# Patient Record
Sex: Female | Born: 1955 | Race: White | Hispanic: No | Marital: Married | Smoking: Never smoker
Health system: Southern US, Community
[De-identification: ages and names within clinical notes are randomized; demographics above are authoritative.]

## PROBLEM LIST (undated history)

## (undated) DIAGNOSIS — C799 Secondary malignant neoplasm of unspecified site: Secondary | ICD-10-CM

## (undated) DIAGNOSIS — B029 Zoster without complications: Secondary | ICD-10-CM

## (undated) DIAGNOSIS — D249 Benign neoplasm of unspecified breast: Secondary | ICD-10-CM

## (undated) DIAGNOSIS — C3490 Malignant neoplasm of unspecified part of unspecified bronchus or lung: Secondary | ICD-10-CM

## (undated) DIAGNOSIS — R51 Headache: Secondary | ICD-10-CM

## (undated) DIAGNOSIS — C7951 Secondary malignant neoplasm of bone: Secondary | ICD-10-CM

## (undated) DIAGNOSIS — Z9221 Personal history of antineoplastic chemotherapy: Secondary | ICD-10-CM

## (undated) DIAGNOSIS — IMO0002 Reserved for concepts with insufficient information to code with codable children: Secondary | ICD-10-CM

## (undated) DIAGNOSIS — G939 Disorder of brain, unspecified: Secondary | ICD-10-CM

## (undated) DIAGNOSIS — C7931 Secondary malignant neoplasm of brain: Secondary | ICD-10-CM

## (undated) DIAGNOSIS — C50919 Malignant neoplasm of unspecified site of unspecified female breast: Secondary | ICD-10-CM

## (undated) DIAGNOSIS — Z923 Personal history of irradiation: Secondary | ICD-10-CM

## (undated) DIAGNOSIS — K589 Irritable bowel syndrome without diarrhea: Secondary | ICD-10-CM

## (undated) DIAGNOSIS — R918 Other nonspecific abnormal finding of lung field: Secondary | ICD-10-CM

## (undated) DIAGNOSIS — Z79890 Hormone replacement therapy (postmenopausal): Secondary | ICD-10-CM

## (undated) DIAGNOSIS — IMO0001 Reserved for inherently not codable concepts without codable children: Secondary | ICD-10-CM

## (undated) HISTORY — DX: Zoster without complications: B02.9

## (undated) HISTORY — DX: Other nonspecific abnormal finding of lung field: R91.8

## (undated) HISTORY — DX: Malignant neoplasm of unspecified part of unspecified bronchus or lung: C34.90

## (undated) HISTORY — DX: Secondary malignant neoplasm of bone: C79.51

## (undated) HISTORY — DX: Benign neoplasm of unspecified breast: D24.9

## (undated) HISTORY — PX: LUNG BIOPSY: SHX5088

## (undated) HISTORY — DX: Secondary malignant neoplasm of brain: C79.31

## (undated) HISTORY — PX: VERTEBROPLASTY: SHX113

## (undated) HISTORY — DX: Irritable bowel syndrome, unspecified: K58.9

## (undated) HISTORY — DX: Personal history of irradiation: Z92.3

## (undated) HISTORY — DX: Hormone replacement therapy: Z79.890

## (undated) HISTORY — DX: Malignant neoplasm of unspecified site of unspecified female breast: C50.919

---

## 1998-12-31 ENCOUNTER — Other Ambulatory Visit: Admission: RE | Admit: 1998-12-31 | Discharge: 1998-12-31 | Payer: Self-pay | Admitting: Obstetrics and Gynecology

## 1999-01-27 ENCOUNTER — Ambulatory Visit (HOSPITAL_COMMUNITY): Admission: RE | Admit: 1999-01-27 | Discharge: 1999-01-27 | Payer: Self-pay | Admitting: Obstetrics and Gynecology

## 1999-01-27 ENCOUNTER — Encounter: Payer: Self-pay | Admitting: Obstetrics and Gynecology

## 1999-08-03 ENCOUNTER — Encounter: Payer: Self-pay | Admitting: Obstetrics and Gynecology

## 1999-08-03 ENCOUNTER — Encounter: Admission: RE | Admit: 1999-08-03 | Discharge: 1999-08-03 | Payer: Self-pay | Admitting: Otolaryngology

## 2000-02-08 ENCOUNTER — Other Ambulatory Visit: Admission: RE | Admit: 2000-02-08 | Discharge: 2000-02-08 | Payer: Self-pay | Admitting: Obstetrics and Gynecology

## 2000-09-05 ENCOUNTER — Encounter: Payer: Self-pay | Admitting: Obstetrics and Gynecology

## 2000-09-05 ENCOUNTER — Ambulatory Visit (HOSPITAL_COMMUNITY): Admission: RE | Admit: 2000-09-05 | Discharge: 2000-09-05 | Payer: Self-pay | Admitting: Obstetrics and Gynecology

## 2000-09-07 ENCOUNTER — Encounter: Admission: RE | Admit: 2000-09-07 | Discharge: 2000-09-07 | Payer: Self-pay | Admitting: Obstetrics and Gynecology

## 2000-09-07 ENCOUNTER — Encounter: Payer: Self-pay | Admitting: Obstetrics and Gynecology

## 2001-02-20 ENCOUNTER — Other Ambulatory Visit: Admission: RE | Admit: 2001-02-20 | Discharge: 2001-02-20 | Payer: Self-pay | Admitting: Obstetrics and Gynecology

## 2002-04-29 ENCOUNTER — Other Ambulatory Visit: Admission: RE | Admit: 2002-04-29 | Discharge: 2002-04-29 | Payer: Self-pay | Admitting: Obstetrics and Gynecology

## 2002-07-30 ENCOUNTER — Encounter: Payer: Self-pay | Admitting: Obstetrics and Gynecology

## 2002-07-30 ENCOUNTER — Ambulatory Visit (HOSPITAL_COMMUNITY): Admission: RE | Admit: 2002-07-30 | Discharge: 2002-07-30 | Payer: Self-pay | Admitting: Obstetrics and Gynecology

## 2003-07-14 ENCOUNTER — Other Ambulatory Visit: Admission: RE | Admit: 2003-07-14 | Discharge: 2003-07-14 | Payer: Self-pay | Admitting: Obstetrics and Gynecology

## 2003-07-31 ENCOUNTER — Ambulatory Visit (HOSPITAL_COMMUNITY): Admission: RE | Admit: 2003-07-31 | Discharge: 2003-07-31 | Payer: Self-pay | Admitting: Obstetrics and Gynecology

## 2004-08-05 ENCOUNTER — Ambulatory Visit (HOSPITAL_COMMUNITY): Admission: RE | Admit: 2004-08-05 | Discharge: 2004-08-05 | Payer: Self-pay | Admitting: Obstetrics and Gynecology

## 2004-09-30 ENCOUNTER — Other Ambulatory Visit: Admission: RE | Admit: 2004-09-30 | Discharge: 2004-09-30 | Payer: Self-pay | Admitting: Obstetrics and Gynecology

## 2005-10-04 ENCOUNTER — Other Ambulatory Visit: Admission: RE | Admit: 2005-10-04 | Discharge: 2005-10-04 | Payer: Self-pay | Admitting: Obstetrics and Gynecology

## 2005-10-04 ENCOUNTER — Ambulatory Visit (HOSPITAL_COMMUNITY): Admission: RE | Admit: 2005-10-04 | Discharge: 2005-10-04 | Payer: Self-pay | Admitting: Obstetrics and Gynecology

## 2005-10-16 HISTORY — PX: INTRAUTERINE DEVICE INSERTION: SHX323

## 2006-11-16 ENCOUNTER — Ambulatory Visit (HOSPITAL_COMMUNITY): Admission: RE | Admit: 2006-11-16 | Discharge: 2006-11-16 | Payer: Self-pay | Admitting: Obstetrics and Gynecology

## 2006-11-16 ENCOUNTER — Other Ambulatory Visit: Admission: RE | Admit: 2006-11-16 | Discharge: 2006-11-16 | Payer: Self-pay | Admitting: Obstetrics & Gynecology

## 2007-11-18 ENCOUNTER — Other Ambulatory Visit: Admission: RE | Admit: 2007-11-18 | Discharge: 2007-11-18 | Payer: Self-pay | Admitting: Obstetrics and Gynecology

## 2007-12-17 ENCOUNTER — Ambulatory Visit (HOSPITAL_COMMUNITY): Admission: RE | Admit: 2007-12-17 | Discharge: 2007-12-17 | Payer: Self-pay | Admitting: Obstetrics and Gynecology

## 2010-11-30 ENCOUNTER — Other Ambulatory Visit: Payer: Self-pay | Admitting: Nurse Practitioner

## 2010-11-30 DIAGNOSIS — Z1231 Encounter for screening mammogram for malignant neoplasm of breast: Secondary | ICD-10-CM

## 2010-12-29 ENCOUNTER — Ambulatory Visit (HOSPITAL_COMMUNITY)
Admission: RE | Admit: 2010-12-29 | Discharge: 2010-12-29 | Disposition: A | Discharge status: Home / Self Care | Payer: BC Managed Care – PPO | Source: Ambulatory Visit | Attending: Nurse Practitioner | Admitting: Nurse Practitioner

## 2010-12-29 DIAGNOSIS — Z1231 Encounter for screening mammogram for malignant neoplasm of breast: Secondary | ICD-10-CM | POA: Insufficient documentation

## 2011-01-17 HISTORY — PX: BREAST BIOPSY: SHX20

## 2011-06-07 ENCOUNTER — Other Ambulatory Visit (HOSPITAL_COMMUNITY): Payer: Self-pay | Admitting: Family Medicine

## 2011-06-07 DIAGNOSIS — R937 Abnormal findings on diagnostic imaging of other parts of musculoskeletal system: Secondary | ICD-10-CM

## 2011-06-22 ENCOUNTER — Encounter (HOSPITAL_COMMUNITY)
Admission: RE | Admit: 2011-06-22 | Discharge: 2011-06-22 | Disposition: A | Discharge status: Home / Self Care | Payer: BC Managed Care – PPO | Source: Ambulatory Visit | Attending: Family Medicine | Admitting: Family Medicine

## 2011-06-22 DIAGNOSIS — Z85828 Personal history of other malignant neoplasm of skin: Secondary | ICD-10-CM | POA: Insufficient documentation

## 2011-06-22 DIAGNOSIS — C801 Malignant (primary) neoplasm, unspecified: Secondary | ICD-10-CM | POA: Insufficient documentation

## 2011-06-22 DIAGNOSIS — M25559 Pain in unspecified hip: Secondary | ICD-10-CM | POA: Insufficient documentation

## 2011-06-22 DIAGNOSIS — C7800 Secondary malignant neoplasm of unspecified lung: Secondary | ICD-10-CM | POA: Insufficient documentation

## 2011-06-22 DIAGNOSIS — R937 Abnormal findings on diagnostic imaging of other parts of musculoskeletal system: Secondary | ICD-10-CM

## 2011-06-22 DIAGNOSIS — M545 Low back pain, unspecified: Secondary | ICD-10-CM | POA: Insufficient documentation

## 2011-06-22 DIAGNOSIS — K7689 Other specified diseases of liver: Secondary | ICD-10-CM | POA: Insufficient documentation

## 2011-06-22 DIAGNOSIS — C770 Secondary and unspecified malignant neoplasm of lymph nodes of head, face and neck: Secondary | ICD-10-CM | POA: Insufficient documentation

## 2011-06-22 DIAGNOSIS — C771 Secondary and unspecified malignant neoplasm of intrathoracic lymph nodes: Secondary | ICD-10-CM | POA: Insufficient documentation

## 2011-06-22 DIAGNOSIS — M8448XA Pathological fracture, other site, initial encounter for fracture: Secondary | ICD-10-CM | POA: Insufficient documentation

## 2011-06-22 DIAGNOSIS — R918 Other nonspecific abnormal finding of lung field: Secondary | ICD-10-CM

## 2011-06-22 DIAGNOSIS — I517 Cardiomegaly: Secondary | ICD-10-CM | POA: Insufficient documentation

## 2011-06-22 DIAGNOSIS — C7951 Secondary malignant neoplasm of bone: Secondary | ICD-10-CM | POA: Insufficient documentation

## 2011-06-22 HISTORY — DX: Other nonspecific abnormal finding of lung field: R91.8

## 2011-06-22 MED ORDER — TECHNETIUM TC 99M MEDRONATE IV KIT
24.0000 | PACK | Freq: Once | INTRAVENOUS | Status: AC | PRN
  Administered 2011-06-22: 24 via INTRAVENOUS

## 2011-06-22 MED ORDER — IOHEXOL 300 MG/ML  SOLN
100.0000 mL | Freq: Once | INTRAMUSCULAR | Status: AC | PRN
  Administered 2011-06-22: 100 mL via INTRAVENOUS

## 2011-06-26 ENCOUNTER — Telehealth: Payer: Self-pay | Admitting: Hematology & Oncology

## 2011-06-26 NOTE — Telephone Encounter (Signed)
Pt aware of 6-12 appointment °

## 2011-06-28 ENCOUNTER — Ambulatory Visit (HOSPITAL_BASED_OUTPATIENT_CLINIC_OR_DEPARTMENT_OTHER): Payer: BC Managed Care – PPO | Admitting: Hematology & Oncology

## 2011-06-28 ENCOUNTER — Ambulatory Visit: Payer: BC Managed Care – PPO

## 2011-06-28 ENCOUNTER — Other Ambulatory Visit (HOSPITAL_BASED_OUTPATIENT_CLINIC_OR_DEPARTMENT_OTHER): Payer: BC Managed Care – PPO | Admitting: Lab

## 2011-06-28 VITALS — BP 149/83 | HR 101 | Temp 97.1°F | Ht 62.0 in | Wt 173.0 lb

## 2011-06-28 DIAGNOSIS — C787 Secondary malignant neoplasm of liver and intrahepatic bile duct: Secondary | ICD-10-CM

## 2011-06-28 DIAGNOSIS — C7951 Secondary malignant neoplasm of bone: Secondary | ICD-10-CM

## 2011-06-28 DIAGNOSIS — C7800 Secondary malignant neoplasm of unspecified lung: Secondary | ICD-10-CM

## 2011-06-28 DIAGNOSIS — C801 Malignant (primary) neoplasm, unspecified: Secondary | ICD-10-CM

## 2011-06-28 LAB — CBC WITH DIFFERENTIAL (CANCER CENTER ONLY)
EOS%: 3.6 % (ref 0.0–7.0)
MCH: 27.9 pg (ref 26.0–34.0)
MONO%: 8.9 % (ref 0.0–13.0)
Platelets: 531 10*3/uL — ABNORMAL HIGH (ref 145–400)
WBC: 10.1 10*3/uL — ABNORMAL HIGH (ref 3.9–10.0)

## 2011-06-28 LAB — PROTIME-INR (CHCC SATELLITE)
INR: 1.1 — ABNORMAL LOW (ref 2.0–3.5)
Protime: 13.2 Seconds (ref 10.6–13.4)

## 2011-06-28 MED ORDER — HYDROCODONE-IBUPROFEN 7.5-200 MG PO TABS: 1.0000 | ORAL_TABLET | Freq: Three times a day (TID) | ORAL | Status: DC | PRN

## 2011-06-28 MED ORDER — ALPRAZOLAM 0.25 MG PO TABS: ORAL_TABLET | ORAL | Status: DC

## 2011-06-28 NOTE — Progress Notes (Signed)
This office note has been dictated.

## 2011-06-29 ENCOUNTER — Other Ambulatory Visit: Payer: Self-pay | Admitting: Radiology

## 2011-06-29 NOTE — Progress Notes (Signed)
CC:   [name], M.D.  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  HISTORY OF PRESENT ILLNESS:  Ms. Hanko is a very nice 56 year old white female.  She is originally from [facility].  She knew exactly who Casimiro Needle was.  She is followed by Dr. [name].  She has been complaining of some back and hip pain.  This is in the right hip area.  She denied any kind of trauma.  She works for an [employer].  She has had no weight loss or weight gain.  Appetite has been okay. There has been no change in bowel or bladder habits.  She has had no rashes.  She has had no headache.  She was seen by Dr. [name].  Dr. Lenise Arena, astutely, went ahead and did some films on her.  The plain films showed some significant abnormalities.  As such, these were followed up with CT scans.  She had a CT scan of the chest, abdomen, pelvis on June 6.  Unfortunately, the CT scans showed multiple pulmonary nodules.  These were bilateral.  She did have a mass-like lesion in the left lower lobe measuring 6.4 x 3.6 cm.  She did have some left supraclavicular lymphadenopathy.  She had a left suprahilar lymph node.  She had a precarinal node.  There was some fullness in the left breast.  She had multiple bony lesions.  There was a left hepatic dome lesion measuring 1.4 cm.  She was noted to have a right superior pubic ramus  pathologic fracture.  A lytic lesion was noted in the left femoral head.  She had a bone scan done, which showed multiple areas of bony metastases.  Upon finding this, Dr. Lenise Arena kindly referred Ms. Smolen to the [facility] for evaluation.  Ms. Schlotter  gets her mammograms yearly.  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  She has not noted any fever.  There has been no dysphasia or odynophagia.  She has had no double vision or blurred vision.  She has had no bleeding.  She has been taking  Motrin and Vicodin to try to help with some of the back and hip discomfort.  PAST MEDICAL HISTORY:  Remarkable for an irritable bowel syndrome.  ALLERGIES:  None.  MEDICATIONS:  Vivelle patch 0.0375 mg twice a week, Provera 5 mg p.o. daily, Vicodin (5/500) 1 p.o. q.6 hours p.r.n.,  Flexeril 10 mg p.o. daily p.r.n.,  Motrin 200 mg p.o. q.6 hours p.r.n.  SOCIAL HISTORY:  Negative for tobacco use.  There is rare alcohol use. There is no obvious occupational exposures.  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  FAMILY HISTORY:  Pretty much unremarkable.  There is no history of malignancy in the family.  REVIEW OF SYSTEMS:  As stated in history of PI the history of present illness.  No additional findings  are noted on a 12-system review.  PHYSICAL EXAMINATION:  This is a well-developed, well-nourished white female in no obvious distress.  Vital signs:  Temperature of 97.1, pulse 101, respiratory rate 20, blood pressure 149/83.  Weight is 173.  Head and neck:  Normocephalic, atraumatic skull.  There are no ocular or oral lesions.  There is no adenopathy in the neck.  I cannot palpate any supraclavicular lymph nodes.  Lungs:  Clear bilaterally.  There are no rales, wheeze or rhonchi.  Cardiac:  Tachycardic but regular.  There are no murmurs, rubs or  bruits.  Axillary:  No bilateral axillary adenopathy.  Abdomen:  Soft with good bowel sounds.  There is no palpable abdominal mass.  There is no fluid wave.  There is no palpable hepatosplenomegaly. Back:  No tenderness over the spine, ribs, or hips. Extremities:  No clubbing, cyanosis or edema.  She has good range motion of her joints.  There is some decreased range of motion of the right hip.  Skin: No rashes, ecchymoses or petechia.  Neurologic:  No focal neurological deficits.  LABORATORY STUDIES:  White cell count is 10.1, hemoglobin 11.8, hematocrit 35.9 and platelet count 531.  INR is 1.1.  IMPRESSION:  Ms. Mandala is a very  nice 56 year old white female.  She has a widely metastatic disease.  We do not have a biopsy yet.  The real key is trying to decide what best to biopsy.  She does have this large mass in the left lung.  I think this might be a reasonable spot to biopsy.  This also noted a left supraclavicular lymph node.  I really could not palpate a supraclavicular lymph node.  She has multiple bone abnormalities.  However, I would prefer not to have to go after a bony lesion.  She will definitely need radiation therapy to the right hip area.  I will speak with Radiation Oncology in the morning and have them look at the x-rays.  I am going to hold off on doing further studies on her until we get the path results back.  I want be able to try to focus in on the appropriate staging studies to do.  I did forget to mention that she does have a history of squamous cell carcinomas removed and a basal carcinoma.  I would be hard pressed to believe that these abnormality seen on scan represent metastatic disease for either of these tumors.  There is a history of melanoma in, I think, an uncle.  I suppose this might increase the risk of her having melanoma.  Again, she has not had any suspicious moles removed.  I would not think that this is a lymphoproliferative process.  I also would not think that this would be a sarcoma or other connective tissue type of malignancy.  There was noted on CT scan to be in an abnormality in the left breast. Again, metastatic breast cancer, I suppose, could be a possibility, although she has had her mammograms on a routine basis.  Her last mammogram was done back in December.  Everything looked fine with a 1 year followup being recommended.  I spent a good hour and a half with Ms. Baze and her husband. They are both very, very nice.  It was certainly fun talking to them about Casimiro Needle and [place] football.  Once we get the biopsy results back, then we will be  able to get Ms. Gaumond back in and figure out how we can best treat this problem.    ______________________________ Josph Macho, M.D. PRE/MEDQ  D:  06/28/2011  T:  06/29/2011  Job:  [id number]

## 2011-06-29 NOTE — Addendum Note (Signed)
Addended by: Arlan Organ R on: 06/29/2011 12:31 PM   Modules accepted: Orders

## 2011-06-30 ENCOUNTER — Encounter: Payer: Self-pay | Admitting: *Deleted

## 2011-06-30 ENCOUNTER — Encounter (HOSPITAL_COMMUNITY): Payer: Self-pay | Admitting: Pharmacy Technician

## 2011-06-30 DIAGNOSIS — D249 Benign neoplasm of unspecified breast: Secondary | ICD-10-CM | POA: Insufficient documentation

## 2011-06-30 LAB — COMPREHENSIVE METABOLIC PANEL
ALT: 51 U/L — ABNORMAL HIGH (ref 0–35)
AST: 41 U/L — ABNORMAL HIGH (ref 0–37)
Alkaline Phosphatase: 422 U/L — ABNORMAL HIGH (ref 39–117)
BUN: 8 mg/dL (ref 6–23)
CO2: 26 mEq/L (ref 19–32)
Calcium: 9.7 mg/dL (ref 8.4–10.5)
Chloride: 101 mEq/L (ref 96–112)
Potassium: 3.8 mEq/L (ref 3.5–5.3)
Sodium: 137 mEq/L (ref 135–145)

## 2011-06-30 LAB — LACTATE DEHYDROGENASE: LDH: 191 U/L (ref 94–250)

## 2011-06-30 LAB — PREALBUMIN: Prealbumin: 12.4 mg/dL — ABNORMAL LOW (ref 17.0–34.0)

## 2011-06-30 NOTE — Progress Notes (Signed)
Married, 2 sons, works for [employer]

## 2011-07-03 ENCOUNTER — Encounter (HOSPITAL_BASED_OUTPATIENT_CLINIC_OR_DEPARTMENT_OTHER): Payer: Self-pay | Admitting: *Deleted

## 2011-07-03 ENCOUNTER — Ambulatory Visit (HOSPITAL_COMMUNITY)
Admission: RE | Admit: 2011-07-03 | Discharge: 2011-07-03 | Disposition: A | Discharge status: Home / Self Care | Payer: BC Managed Care – PPO | Source: Ambulatory Visit | Attending: Interventional Radiology | Admitting: Interventional Radiology

## 2011-07-03 ENCOUNTER — Ambulatory Visit (HOSPITAL_COMMUNITY)
Admission: RE | Admit: 2011-07-03 | Discharge: 2011-07-03 | Disposition: A | Discharge status: Home / Self Care | Payer: BC Managed Care – PPO | Source: Ambulatory Visit | Attending: Hematology & Oncology | Admitting: Hematology & Oncology

## 2011-07-03 ENCOUNTER — Emergency Department (HOSPITAL_BASED_OUTPATIENT_CLINIC_OR_DEPARTMENT_OTHER)
Admission: EM | Admit: 2011-07-03 | Discharge: 2011-07-03 | Disposition: A | Discharge status: Home / Self Care | Payer: BC Managed Care – PPO | Source: Home / Self Care | Attending: Emergency Medicine | Admitting: Emergency Medicine

## 2011-07-03 ENCOUNTER — Encounter (HOSPITAL_COMMUNITY): Payer: Self-pay

## 2011-07-03 ENCOUNTER — Other Ambulatory Visit: Payer: Self-pay | Admitting: Hematology & Oncology

## 2011-07-03 ENCOUNTER — Inpatient Hospital Stay (HOSPITAL_BASED_OUTPATIENT_CLINIC_OR_DEPARTMENT_OTHER)
Admission: EM | Admit: 2011-07-03 | Discharge: 2011-07-07 | DRG: 866 | Disposition: A | Discharge status: Home / Self Care | Payer: BC Managed Care – PPO | Attending: Internal Medicine | Admitting: Internal Medicine

## 2011-07-03 ENCOUNTER — Emergency Department (HOSPITAL_BASED_OUTPATIENT_CLINIC_OR_DEPARTMENT_OTHER): Payer: BC Managed Care – PPO

## 2011-07-03 VITALS — BP 118/72 | HR 93 | Temp 97.4°F | Resp 18 | Ht 62.0 in | Wt 173.0 lb

## 2011-07-03 DIAGNOSIS — M62830 Muscle spasm of back: Secondary | ICD-10-CM

## 2011-07-03 DIAGNOSIS — C801 Malignant (primary) neoplasm, unspecified: Secondary | ICD-10-CM

## 2011-07-03 DIAGNOSIS — R7989 Other specified abnormal findings of blood chemistry: Secondary | ICD-10-CM | POA: Diagnosis present

## 2011-07-03 DIAGNOSIS — K589 Irritable bowel syndrome without diarrhea: Secondary | ICD-10-CM | POA: Insufficient documentation

## 2011-07-03 DIAGNOSIS — G893 Neoplasm related pain (acute) (chronic): Secondary | ICD-10-CM | POA: Diagnosis present

## 2011-07-03 DIAGNOSIS — C7800 Secondary malignant neoplasm of unspecified lung: Secondary | ICD-10-CM | POA: Diagnosis present

## 2011-07-03 DIAGNOSIS — R0902 Hypoxemia: Secondary | ICD-10-CM | POA: Diagnosis present

## 2011-07-03 DIAGNOSIS — D638 Anemia in other chronic diseases classified elsewhere: Secondary | ICD-10-CM | POA: Diagnosis present

## 2011-07-03 DIAGNOSIS — IMO0002 Reserved for concepts with insufficient information to code with codable children: Secondary | ICD-10-CM

## 2011-07-03 DIAGNOSIS — C3490 Malignant neoplasm of unspecified part of unspecified bronchus or lung: Secondary | ICD-10-CM

## 2011-07-03 DIAGNOSIS — C3430 Malignant neoplasm of lower lobe, unspecified bronchus or lung: Secondary | ICD-10-CM | POA: Insufficient documentation

## 2011-07-03 DIAGNOSIS — J90 Pleural effusion, not elsewhere classified: Secondary | ICD-10-CM | POA: Diagnosis present

## 2011-07-03 DIAGNOSIS — M25559 Pain in unspecified hip: Secondary | ICD-10-CM | POA: Insufficient documentation

## 2011-07-03 DIAGNOSIS — C7951 Secondary malignant neoplasm of bone: Secondary | ICD-10-CM | POA: Insufficient documentation

## 2011-07-03 DIAGNOSIS — R079 Chest pain, unspecified: Secondary | ICD-10-CM

## 2011-07-03 DIAGNOSIS — R000 Tachycardia, unspecified: Secondary | ICD-10-CM | POA: Diagnosis present

## 2011-07-03 DIAGNOSIS — Z79899 Other long term (current) drug therapy: Secondary | ICD-10-CM

## 2011-07-03 DIAGNOSIS — E860 Dehydration: Secondary | ICD-10-CM | POA: Diagnosis present

## 2011-07-03 DIAGNOSIS — M8448XA Pathological fracture, other site, initial encounter for fracture: Secondary | ICD-10-CM | POA: Diagnosis present

## 2011-07-03 DIAGNOSIS — M549 Dorsalgia, unspecified: Secondary | ICD-10-CM | POA: Insufficient documentation

## 2011-07-03 DIAGNOSIS — C799 Secondary malignant neoplasm of unspecified site: Secondary | ICD-10-CM

## 2011-07-03 DIAGNOSIS — K5900 Constipation, unspecified: Secondary | ICD-10-CM | POA: Diagnosis present

## 2011-07-03 HISTORY — DX: Secondary malignant neoplasm of unspecified site: C79.9

## 2011-07-03 LAB — CBC
HCT: 35.9 % — ABNORMAL LOW (ref 36.0–46.0)
Hemoglobin: 11.8 g/dL — ABNORMAL LOW (ref 12.0–15.0)
MCH: 27.6 pg (ref 26.0–34.0)
MCHC: 32.9 g/dL (ref 30.0–36.0)
MCV: 83.9 fL (ref 78.0–100.0)
Platelets: 496 K/uL — ABNORMAL HIGH (ref 150–400)
RBC: 4.28 MIL/uL (ref 3.87–5.11)
RDW: 14.5 % (ref 11.5–15.5)
WBC: 10.4 K/uL (ref 4.0–10.5)

## 2011-07-03 LAB — PROTIME-INR
INR: 1.02 (ref 0.00–1.49)
Prothrombin Time: 13.6 s (ref 11.6–15.2)

## 2011-07-03 MED ORDER — ONDANSETRON HCL 4 MG/2ML IJ SOLN
4.0000 mg | Freq: Once | INTRAMUSCULAR | Status: AC
  Administered 2011-07-03: 4 mg via INTRAVENOUS
  Filled 2011-07-03: qty 2

## 2011-07-03 MED ORDER — HYDROMORPHONE HCL PF 1 MG/ML IJ SOLN
1.0000 mg | Freq: Once | INTRAMUSCULAR | Status: AC
  Administered 2011-07-03: 1 mg via INTRAVENOUS
  Filled 2011-07-03: qty 1

## 2011-07-03 MED ORDER — PROMETHAZINE HCL 12.5 MG PO TABS: ORAL_TABLET | ORAL | Status: DC

## 2011-07-03 MED ORDER — MIDAZOLAM HCL 2 MG/2ML IJ SOLN
INTRAMUSCULAR | Status: AC
  Filled 2011-07-03: qty 4

## 2011-07-03 MED ORDER — SODIUM CHLORIDE 0.9 % IV SOLN: Freq: Once | INTRAVENOUS | Status: DC

## 2011-07-03 MED ORDER — CYCLOBENZAPRINE HCL 10 MG PO TABS: 10.0000 mg | ORAL_TABLET | Freq: Three times a day (TID) | ORAL | Status: AC | PRN

## 2011-07-03 MED ORDER — MIDAZOLAM HCL 5 MG/5ML IJ SOLN
INTRAMUSCULAR | Status: AC | PRN
  Administered 2011-07-03 (×2): 1 mg via INTRAVENOUS

## 2011-07-03 MED ORDER — HYDROMORPHONE HCL 2 MG PO TABS: 2.0000 mg | ORAL_TABLET | ORAL | Status: AC | PRN

## 2011-07-03 MED ORDER — FENTANYL CITRATE 0.05 MG/ML IJ SOLN
INTRAMUSCULAR | Status: AC | PRN
  Administered 2011-07-03: 50 ug via INTRAVENOUS
  Administered 2011-07-03: 25 ug via INTRAVENOUS

## 2011-07-03 MED ORDER — FENTANYL CITRATE 0.05 MG/ML IJ SOLN
INTRAMUSCULAR | Status: AC
  Filled 2011-07-03: qty 4

## 2011-07-03 MED FILL — Midazolam HCl Inj 2 MG/2ML (Base Equivalent): INTRAMUSCULAR | Qty: 1 | Status: AC

## 2011-07-03 MED FILL — Fentanyl Citrate Inj 0.05 MG/ML: INTRAMUSCULAR | Qty: 0.5 | Status: AC

## 2011-07-03 NOTE — Discharge Instructions (Signed)
Lung Biopsy A Lung Biopsy involves taking a small piece of lung tissue so it can be examined under a microscope by a pathologist to help diagnose many different lung disorders. This test can be done in a variety of ways. One way is to open the chest during surgery and remove a piece of lung tissue. It can also be done through a bronchoscope (a pencil-sized telescope) which allows the caregiver to remove a piece of tissue through your trachea (the large breathing tube to your lungs). Another procedure is a needle biopsy of the lung, which involves inserting a needle into the lung and removing a small piece of tissue. POSSIBLE COMPLICATIONS INCLUDE:  Collapse of the lung   Bleeding   Infection  PREPARATION FOR TEST No preparation or fasting is necessary. NORMAL FINDINGS No evidence of pathology. Ranges for normal findings may vary among different laboratories and hospitals. You should always check with your doctor after having lab work or other tests done to discuss the meaning of your test results and whether your values are considered within normal limits. MEANING OF TEST   Your caregiver will go over the test results with you and discuss the importance and meaning of your results, as well as treatment options and the need for additional tests if necessary. OBTAINING THE TEST RESULTS It is your responsibility to obtain your test results. Ask the lab or department performing the test when and how you will get your results. Document Released: 03/23/2004 Document Revised: 12/22/2010 Document Reviewed: 12/14/2007 Exit[employer] Patient Information 2012 [employer]. 

## 2011-07-03 NOTE — ED Notes (Signed)
Pt diagnosed with cancer, had lung biopsy today, pain has been increasing in intensity x 3 days, today pain became unbearable, dr. Myna Hidalgo is oncologist,

## 2011-07-03 NOTE — ED Notes (Signed)
To XRAY

## 2011-07-03 NOTE — ED Notes (Signed)
Pt states she just found out she has cancer "all over" on Friday a week ago. Scheduled for biopsy tomorrow. Now c/o pain along rib cage. Taking Hydrocodone and Ibuprofen without relief.

## 2011-07-03 NOTE — Procedures (Signed)
LLL lung mass Bx 18 g core times 4 Small PTX

## 2011-07-03 NOTE — ED Notes (Signed)
Pt seen here last night for same. Dilaudid tabs prescribed last PM is not working.

## 2011-07-03 NOTE — H&P (Signed)
[name] is an 56 y.o. female.   Chief Complaint: back and hip pain since 1/13 Xray and CT show bony, liver and lung lesions Scheduled now for left lung mass biopsy HPI: IBS; hx skin ca  Past Medical History  Diagnosis Date  . Pulmonary nodules 06/22/11    per CT scan  . IBS (irritable bowel syndrome)   . Fibroadenoma of breast     hx of  . Metastasis of unknown origin     History reviewed. No pertinent past surgical history.  History reviewed. No pertinent family history. Social History:  reports that she has never smoked. She has never used smokeless tobacco. She reports that she drinks alcohol. She reports that she does not use illicit drugs.  Allergies: No Known Allergies   (Not in a hospital admission)  Results for orders placed during the hospital encounter of 07/03/11 (from the past 48 hour(s))  APTT     Status: Normal   Collection Time   07/03/11  7:46 AM      Component Value Range Comment   aPTT 31  24 - 37 seconds   CBC     Status: Abnormal   Collection Time   07/03/11  7:46 AM      Component Value Range Comment   WBC 10.4  4.0 - 10.5 K/uL    RBC 4.28  3.87 - 5.11 MIL/uL    Hemoglobin 11.8 (*) 12.0 - 15.0 g/dL    HCT 47.8 (*) 29.5 - 46.0 %    MCV 83.9  78.0 - 100.0 fL    MCH 27.6  26.0 - 34.0 pg    MCHC 32.9  30.0 - 36.0 g/dL    RDW 62.1  30.8 - 65.7 %    Platelets 496 (*) 150 - 400 K/uL   PROTIME-INR     Status: Normal   Collection Time   07/03/11  7:46 AM      Component Value Range Comment   Prothrombin Time 13.6  11.6 - 15.2 seconds    INR 1.02  0.00 - 1.49    No results found.  Review of Systems  Constitutional: Negative for fever.  HENT: Positive for neck pain.   Cardiovascular: Positive for chest pain.  Gastrointestinal: Negative for nausea and vomiting.  Musculoskeletal: Positive for back pain and joint pain.  Neurological: Negative for headaches.    Blood pressure 147/89, pulse 83, temperature 97.2 F (36.2 C), temperature source  Oral, resp. rate 18, height 5\' 2"  (1.575 m), weight 173 lb (78.472 kg), SpO2 99.00%. Physical Exam  Constitutional: She is oriented to person, place, and time. She appears well-developed and well-nourished.  Cardiovascular: Normal rate, regular rhythm and normal heart sounds.   No murmur heard. Respiratory: Effort normal and breath sounds normal. She has no wheezes.  GI: Soft. Bowel sounds are normal. There is no tenderness.  Musculoskeletal: Normal range of motion.       Pain in back and hips and ant chest; difficult to ambulate and move  Neurological: She is alert and oriented to person, place, and time. No cranial nerve deficit.  Psychiatric: She has a normal mood and affect. Her behavior is normal. Judgment and thought content normal.     Assessment/Plan Continued back and hip pain for months Xray and CT show liver, lung and bony lesions Scheduled for left lung mass biopsy today Pt and husband aware of procedure benefits and risks and agreeable to proceed. Consent signed.  Meaghann Choo A 07/03/2011, 8:46 AM

## 2011-07-03 NOTE — ED Notes (Signed)
Patients o2 dropped to 77% on RA. Patient placed on 2L [place] and O2 increased to 99%.

## 2011-07-03 NOTE — ED Provider Notes (Signed)
History     CSN: [id number]  Arrival date & time 07/03/11  0046   First MD Initiated Contact with Patient 07/03/11 0106      Chief Complaint  Patient presents with  . Back Pain    (Consider location/radiation/quality/duration/timing/severity/associated sxs/prior treatment) HPI This is a 56 year old white female who was recently diagnosed with widespread metastatic cancer of unknown source. She has multiple bony metastases. Her worst pain is in the center of her back radiating bilaterally around to her chest. This pain is severe. She was prescribed hydrocodone for it which was partially relieved to get for the past 2 days but yesterday evening. Helping. The pain is sharp and exacerbated by movement and certain positions. She does have a known rib lesion. She denies nausea. She has a biopsy planned for later today.  Past Medical History  Diagnosis Date  . Pulmonary nodules 06/22/11    per CT scan  . IBS (irritable bowel syndrome)   . Fibroadenoma of breast     hx of    History reviewed. No pertinent past surgical history.  History reviewed. No pertinent family history.  History  Substance Use Topics  . Smoking status: Never Smoker   . Smokeless tobacco: Not on file  . Alcohol Use: Yes     rarely    OB History    Grav Para Term Preterm Abortions TAB SAB Ect Mult Living                  Review of Systems  All other systems reviewed and are negative.    Allergies  Review of patient's allergies indicates no known allergies.  Home Medications   Current Outpatient Rx  Name Route Sig Dispense Refill  . ALPRAZOLAM 0.25 MG PO TABS Oral Take 0.25-0.5 mg by mouth every 8 (eight) hours as needed. For anxiety.    Marland Kitchen BISMUTH SUBSALICYLATE 262 MG PO CHEW Oral Chew 524 mg by mouth daily as needed. For indigestion.    Marland Kitchen VITAMIN D 1000 UNITS PO TABS Oral Take 1,000 Units by mouth daily.    Marland Kitchen DIPHENHYDRAMINE HCL 25 MG PO CAPS Oral Take 25 mg by mouth at bedtime as needed. For  sleep.    Marland Kitchen ESTRADIOL 0.0375 MG/24HR TD PTTW Transdermal Place 1 patch onto the skin 2 (two) times a week. Apply on Tuesdays and on Saturdays.    Marland Kitchen HYDROCODONE-IBUPROFEN 7.5-200 MG PO TABS Oral Take 1 tablet by mouth every 8 (eight) hours as needed. For pain.    . IBUPROFEN 200 MG PO TABS Oral Take 200 mg by mouth every 6 (six) hours as needed. For headache.    Marland Kitchen MEDROXYPROGESTERONE ACETATE 5 MG PO TABS Oral Take 2.5 mg by mouth daily.    . MULTIVITAMIN PO Oral Take 1 tablet by mouth every morning.       BP 133/105  Pulse 92  Temp 98.2 F (36.8 C) (Oral)  Resp 24  Ht 5\' 2"  (1.575 m)  Wt 173 lb (78.472 kg)  BMI 31.64 kg/m2  SpO2 99%  Physical Exam General: Well-developed, well-nourished female in apparent discomfort; appearance consistent with age of record HENT: normocephalic, atraumatic Eyes: pupils equal round and reactive to light; extraocular muscles intact Neck: supple Heart: regular rate and rhythm Lungs: clear to auscultation bilaterally Back: Mild tenderness of lower thoracic spine; severe pain on movement of the lower thoracic spine Abdomen: soft; nondistended; nontender; no masses or hepatosplenomegaly; bowel sounds present Extremities: No deformity; full range of motion; pulses normal  Neurologic: Awake, alert and oriented; motor function intact in all extremities and symmetric; no facial droop Skin: Warm and dry Psychiatric: Anxious    ED Course  Procedures (including critical care time)     MDM  3:26 AM Controlled with IV Dilaudid. Patient has a biopsy scheduled for 7:30 morning. We will write for Dilaudid by mouth in lieu of her Vicoprofen.        Hanley Seamen, MD 07/03/11 [phone]

## 2011-07-04 ENCOUNTER — Inpatient Hospital Stay (HOSPITAL_COMMUNITY): Payer: BC Managed Care – PPO

## 2011-07-04 ENCOUNTER — Emergency Department (HOSPITAL_BASED_OUTPATIENT_CLINIC_OR_DEPARTMENT_OTHER): Payer: BC Managed Care – PPO

## 2011-07-04 ENCOUNTER — Ambulatory Visit
Admission: RE | Admit: 2011-07-04 | Discharge: 2011-07-04 | Disposition: A | Discharge status: Home / Self Care | Payer: BC Managed Care – PPO | Source: Ambulatory Visit | Attending: Radiation Oncology | Admitting: Radiation Oncology

## 2011-07-04 ENCOUNTER — Telehealth: Payer: Self-pay | Admitting: *Deleted

## 2011-07-04 ENCOUNTER — Encounter (HOSPITAL_COMMUNITY): Payer: Self-pay | Admitting: Internal Medicine

## 2011-07-04 ENCOUNTER — Ambulatory Visit
Admit: 2011-07-04 | Discharge: 2011-07-04 | Disposition: A | Discharge status: Home / Self Care | Payer: BC Managed Care – PPO | Attending: Radiation Oncology | Admitting: Radiation Oncology

## 2011-07-04 VITALS — BP 127/63 | HR 88 | Resp 20

## 2011-07-04 DIAGNOSIS — J90 Pleural effusion, not elsewhere classified: Secondary | ICD-10-CM | POA: Diagnosis present

## 2011-07-04 DIAGNOSIS — G893 Neoplasm related pain (acute) (chronic): Secondary | ICD-10-CM | POA: Diagnosis present

## 2011-07-04 DIAGNOSIS — M546 Pain in thoracic spine: Secondary | ICD-10-CM

## 2011-07-04 DIAGNOSIS — C7951 Secondary malignant neoplasm of bone: Secondary | ICD-10-CM

## 2011-07-04 DIAGNOSIS — C3490 Malignant neoplasm of unspecified part of unspecified bronchus or lung: Secondary | ICD-10-CM

## 2011-07-04 DIAGNOSIS — C7952 Secondary malignant neoplasm of bone marrow: Secondary | ICD-10-CM

## 2011-07-04 DIAGNOSIS — Z510 Encounter for antineoplastic radiation therapy: Secondary | ICD-10-CM | POA: Insufficient documentation

## 2011-07-04 DIAGNOSIS — M545 Low back pain: Secondary | ICD-10-CM

## 2011-07-04 DIAGNOSIS — C801 Malignant (primary) neoplasm, unspecified: Secondary | ICD-10-CM

## 2011-07-04 LAB — BASIC METABOLIC PANEL
BUN: 9 mg/dL (ref 6–23)
CO2: 26 mEq/L (ref 19–32)
Chloride: 97 mEq/L (ref 96–112)
Creatinine, Ser: 0.5 mg/dL (ref 0.50–1.10)
Potassium: 4 mEq/L (ref 3.5–5.1)

## 2011-07-04 LAB — CBC
HCT: 34.6 % — ABNORMAL LOW (ref 36.0–46.0)
MCH: 27.6 pg (ref 26.0–34.0)
MCV: 83.6 fL (ref 78.0–100.0)
Platelets: 458 10*3/uL — ABNORMAL HIGH (ref 150–400)
RBC: 3.95 MIL/uL (ref 3.87–5.11)
RBC: 4.14 MIL/uL (ref 3.87–5.11)
WBC: 12.2 10*3/uL — ABNORMAL HIGH (ref 4.0–10.5)
WBC: 8.9 10*3/uL (ref 4.0–10.5)

## 2011-07-04 LAB — CREATININE, SERUM: Creatinine, Ser: 0.47 mg/dL — ABNORMAL LOW (ref 0.50–1.10)

## 2011-07-04 MED ORDER — ACETAMINOPHEN 650 MG RE SUPP: 650.0000 mg | Freq: Four times a day (QID) | RECTAL | Status: DC | PRN

## 2011-07-04 MED ORDER — HYDROMORPHONE HCL PF 2 MG/ML IJ SOLN
INTRAMUSCULAR | Status: AC
  Administered 2011-07-04: 1 mg
  Filled 2011-07-04: qty 1

## 2011-07-04 MED ORDER — HYDROMORPHONE HCL PF 1 MG/ML IJ SOLN
1.0000 mg | INTRAMUSCULAR | Status: DC | PRN
  Administered 2011-07-04 (×3): 1 mg via INTRAVENOUS
  Filled 2011-07-04 (×3): qty 1

## 2011-07-04 MED ORDER — DEXAMETHASONE SODIUM PHOSPHATE 4 MG/ML IJ SOLN: 40.0000 mg | INTRAMUSCULAR | Status: DC

## 2011-07-04 MED ORDER — CYCLOBENZAPRINE HCL 10 MG PO TABS
10.0000 mg | ORAL_TABLET | Freq: Three times a day (TID) | ORAL | Status: DC | PRN
  Filled 2011-07-04: qty 1

## 2011-07-04 MED ORDER — FENTANYL 25 MCG/HR TD PT72
25.0000 ug | MEDICATED_PATCH | TRANSDERMAL | Status: DC
  Administered 2011-07-04 – 2011-07-07 (×2): 25 ug via TRANSDERMAL
  Filled 2011-07-04 (×2): qty 1

## 2011-07-04 MED ORDER — SODIUM CHLORIDE 0.9 % IV SOLN
90.0000 mg | Freq: Once | INTRAVENOUS | Status: AC
  Administered 2011-07-04: 90 mg via INTRAVENOUS
  Filled 2011-07-04: qty 500

## 2011-07-04 MED ORDER — ONDANSETRON HCL 4 MG/2ML IJ SOLN
4.0000 mg | Freq: Four times a day (QID) | INTRAMUSCULAR | Status: DC | PRN
  Administered 2011-07-04: 4 mg via INTRAVENOUS
  Filled 2011-07-04: qty 2

## 2011-07-04 MED ORDER — HYDROMORPHONE HCL PF 1 MG/ML IJ SOLN
1.0000 mg | Freq: Once | INTRAMUSCULAR | Status: AC
Start: 2011-07-04 — End: 2011-07-04

## 2011-07-04 MED ORDER — FLEET ENEMA 7-19 GM/118ML RE ENEM: 1.0000 | ENEMA | Freq: Once | RECTAL | Status: AC | PRN

## 2011-07-04 MED ORDER — TRAZODONE 25 MG HALF TABLET
25.0000 mg | ORAL_TABLET | Freq: Every evening | ORAL | Status: DC | PRN
  Filled 2011-07-04: qty 1

## 2011-07-04 MED ORDER — SODIUM CHLORIDE 0.9 % IV SOLN
40.0000 mg | INTRAVENOUS | Status: DC
  Administered 2011-07-04 – 2011-07-06 (×3): 40 mg via INTRAVENOUS
  Filled 2011-07-04 (×4): qty 4

## 2011-07-04 MED ORDER — ONDANSETRON HCL 4 MG PO TABS: 4.0000 mg | ORAL_TABLET | Freq: Four times a day (QID) | ORAL | Status: DC | PRN

## 2011-07-04 MED ORDER — FENTANYL CITRATE 0.05 MG/ML IJ SOLN
INTRAMUSCULAR | Status: AC
  Administered 2011-07-04: 100 ug via INTRAVENOUS
  Filled 2011-07-04: qty 2

## 2011-07-04 MED ORDER — ENOXAPARIN SODIUM 40 MG/0.4ML ~~LOC~~ SOLN
40.0000 mg | SUBCUTANEOUS | Status: DC
  Administered 2011-07-04 – 2011-07-05 (×2): 40 mg via SUBCUTANEOUS
  Filled 2011-07-04 (×3): qty 0.4

## 2011-07-04 MED ORDER — BISACODYL 5 MG PO TBEC: 5.0000 mg | DELAYED_RELEASE_TABLET | Freq: Every day | ORAL | Status: DC | PRN

## 2011-07-04 MED ORDER — MEDROXYPROGESTERONE ACETATE 2.5 MG PO TABS
2.5000 mg | ORAL_TABLET | Freq: Every day | ORAL | Status: DC
  Administered 2011-07-04 – 2011-07-07 (×3): 2.5 mg via ORAL
  Filled 2011-07-04 (×4): qty 1

## 2011-07-04 MED ORDER — FENTANYL CITRATE 0.05 MG/ML IJ SOLN
100.0000 ug | Freq: Once | INTRAMUSCULAR | Status: AC
  Administered 2011-07-04: 100 ug via INTRAVENOUS

## 2011-07-04 MED ORDER — KETOROLAC TROMETHAMINE 30 MG/ML IJ SOLN
INTRAMUSCULAR | Status: AC
  Administered 2011-07-04: 30 mg
  Filled 2011-07-04: qty 1

## 2011-07-04 MED ORDER — DOCUSATE SODIUM 100 MG PO CAPS
100.0000 mg | ORAL_CAPSULE | Freq: Two times a day (BID) | ORAL | Status: DC
Start: 2011-07-04 — End: 2011-07-07
  Administered 2011-07-04 – 2011-07-07 (×6): 100 mg via ORAL
  Filled 2011-07-04 (×8): qty 1

## 2011-07-04 MED ORDER — HYDROMORPHONE HCL PF 1 MG/ML IJ SOLN
1.0000 mg | Freq: Once | INTRAMUSCULAR | Status: AC
  Administered 2011-07-04: 1 mg via INTRAMUSCULAR
  Filled 2011-07-04: qty 1

## 2011-07-04 MED ORDER — IOHEXOL 350 MG/ML SOLN
100.0000 mL | Freq: Once | INTRAVENOUS | Status: AC | PRN
  Administered 2011-07-04: 100 mL via INTRAVENOUS

## 2011-07-04 MED ORDER — ACETAMINOPHEN 325 MG PO TABS: 650.0000 mg | ORAL_TABLET | ORAL | Status: DC | PRN

## 2011-07-04 MED ORDER — POTASSIUM CHLORIDE IN NACL 20-0.9 MEQ/L-% IV SOLN
INTRAVENOUS | Status: DC
  Administered 2011-07-04: 50 mL/h via INTRAVENOUS
  Administered 2011-07-05 – 2011-07-07 (×3): via INTRAVENOUS
  Filled 2011-07-04 (×6): qty 1000

## 2011-07-04 MED ORDER — OXYCODONE HCL 5 MG PO TABS
5.0000 mg | ORAL_TABLET | ORAL | Status: DC | PRN
  Administered 2011-07-04: 5 mg via ORAL
  Filled 2011-07-04: qty 1

## 2011-07-04 MED ORDER — ALPRAZOLAM 0.25 MG PO TABS
0.2500 mg | ORAL_TABLET | Freq: Three times a day (TID) | ORAL | Status: DC | PRN
  Administered 2011-07-07: 0.5 mg via ORAL
  Filled 2011-07-04 (×2): qty 2

## 2011-07-04 MED ORDER — KETOROLAC TROMETHAMINE 30 MG/ML IJ SOLN: 30.0000 mg | Freq: Once | INTRAMUSCULAR | Status: DC

## 2011-07-04 MED ORDER — GADOBENATE DIMEGLUMINE 529 MG/ML IV SOLN
16.0000 mL | Freq: Once | INTRAVENOUS | Status: AC | PRN
  Administered 2011-07-04: 16 mL via INTRAVENOUS

## 2011-07-04 MED ORDER — HYDROMORPHONE HCL PF 2 MG/ML IJ SOLN
2.0000 mg | INTRAMUSCULAR | Status: DC | PRN
  Administered 2011-07-04 – 2011-07-07 (×7): 2 mg via INTRAVENOUS
  Filled 2011-07-04 (×7): qty 1

## 2011-07-04 NOTE — ED Notes (Signed)
Incentive spirometer explained to patient, patient able to return demonstration

## 2011-07-04 NOTE — ED Notes (Signed)
Report given to RN on [facility] and bryce with [facility]

## 2011-07-04 NOTE — Progress Notes (Signed)
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 female who is seen today after being admitted to [facility] for control of her bone pain related to metastatic adenocarcinoma of undetermined primary origin. She states that she first developed low back pain approximately 4 months ago. Her pain radiated to both hips. This became worse and she was seen by her primary care physician, Dr. Joycelyn Moreno, who obtained a plain x-rays and a bone scan on 06/22/2011 showing multiple areas of metastatic disease including the pelvis, spine (T8 vertebral body, L2 vertebral body and L4 vertebral body), left fourth rib, right sixth rib and left femoral head. CT of the chest on 06/22/2011 showed metastatic disease within both lungs, bones, thoracic and lower cervical nodal stations and a left lung base mass perhaps representing a primary lung cancer. Scans of the abdomen showed a 1.4 cm low-density lesion within the liver suspicious for metastatic disease. Scans of the pelvis showed extensive metastatic disease to bone including the right pubic symphysis a lytic lesion in the left femoral head of metastatic disease to the right side of the sacrum and adjacent iliac bone. Metastatic disease was seen at the T8, L1, and L4 vertebra. Her bone pain worsen and she was admitted to [facility] for pain management. She describes pain along the T8 dermatome as being her worst pain and this radiates to the left and right. This pain began approximately 4 days ago. Prior to this her pain was most severe along her or spine, radiating bilaterally, more so to her right hip. A  CT-guided biopsy of her left lung mass yesterday was diagnostic for adenocarcinoma. I spoke with Dr. Luisa Moreno who is running special stains to determine the primary site. Of note is that her CT scan from 06/22/2011 did mention soft tissue fullness with a left breast and suggested that mammography to be helpful. [facility]. She tells me she does have a history of fibrocystic disease.  PREVIOUS RADIATION THERAPY: No   PAST MEDICAL HISTORY:  has a past medical history of Pulmonary nodules (06/22/11); IBS (irritable bowel syndrome); Fibroadenoma of breast; and Metastasis of unknown origin.     PAST SURGICAL HISTORY:  Past Surgical History  Procedure Date  . Lung biopsy      FAMILY HISTORY: family history includes Cancer in her mother. Her mother is alive at age 78. Her father died from complications of asthma at age 47.   SOCIAL HISTORY:  reports that she has never smoked. She has never used smokeless tobacco. She reports that she drinks alcohol. She reports that she does not use illicit drugs. Married, 2 sons. 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   ALLERGIES: Review of patient's allergies indicates no known allergies.   MEDICATIONS:  Current Facility-Administered Medications  Medication Dose Route Frequency Provider Last Rate Last Dose  . HYDROmorphone (DILAUDID) injection 1 mg  1 mg Intramuscular Once Kimberly Gottron, MD  No current outpatient prescriptions on file.   Facility-Administered Medications Ordered in Other Encounters  Medication Dose Route Frequency Provider Last Rate Last Dose  . 0.9 % NaCl with KCl 20 mEq/ L  infusion   Intravenous Continuous Kimberly Rea, MD 50 mL/hr at 07/04/11 0804 50 mL/hr at 07/04/11 0804  . acetaminophen (TYLENOL) tablet 650 mg  650 mg Oral Q4H PRN Kimberly Rea, MD       Or  . acetaminophen (TYLENOL) suppository 650  mg  650 mg Rectal Q6H PRN Kimberly Rea, MD      . ALPRAZolam Prudy Feeler) tablet 0.25-0.5 mg  0.25-0.5 mg Oral Q8H PRN Kimberly Rea, MD      . bisacodyl (DULCOLAX) EC tablet 5 mg  5 mg Oral Daily PRN Kimberly Rea, MD      . cyclobenzaprine (FLEXERIL) tablet 10 mg  10 mg Oral TID PRN Kimberly Rea, MD      . dexamethasone (DECADRON) 40 mg in sodium chloride 0.9 % 50 mL IVPB  40 mg Intravenous Q24H Kimberly Macho, MD      . docusate sodium (COLACE) capsule 100 mg  100 mg Oral BID Kimberly Rea, MD   100 mg at 07/04/11 1027  . enoxaparin (LOVENOX) injection 40 mg  40 mg Subcutaneous Q24H Kimberly Rea, MD   40 mg at 07/04/11 1027  . fentaNYL (DURAGESIC - dosed mcg/hr) patch 25 mcg  25 mcg Transdermal Q72H Kimberly Macho, MD      . fentaNYL (SUBLIMAZE) injection 100 mcg  100 mcg Intravenous Once Kimberly K Palumbo-Rasch, MD   100 mcg at 07/04/11 0014  . HYDROmorphone (DILAUDID) 2 MG/ML injection        1 mg at 07/04/11 0139  . HYDROmorphone (DILAUDID) injection 1 mg  1 mg Intravenous Once Kimberly K Palumbo-Rasch, MD      . HYDROmorphone (DILAUDID) injection 2-4 mg  2-4 mg Intravenous Q2H PRN Kimberly Macho, MD      . iohexol (OMNIPAQUE) 350 MG/ML injection 100 mL  100 mL Intravenous Once PRN Medication Radiologist, MD   100 mL at 07/04/11 0211  . ketorolac (TORADOL) 30 MG/ML injection        30 mg at 07/04/11 0138  . medroxyPROGESTERone (PROVERA) tablet 2.5 mg  2.5 mg Oral Daily Kimberly Rea, MD   2.5 mg at 07/04/11 1027  . ondansetron (ZOFRAN) tablet 4 mg  4 mg Oral Q6H PRN Kimberly Rea, MD       Or  . ondansetron [facility]) injection 4 mg  4 mg Intravenous Q6H PRN Kimberly Rea, MD      . oxyCODONE (Oxy IR/ROXICODONE) immediate release tablet 5 mg  5 mg Oral Q4H PRN Kimberly Rea, MD   5 mg at 07/04/11 1408  . pamidronate (AREDIA) 90 mg in sodium chloride 0.9 % 500 mL IVPB  90 mg Intravenous Once Kimberly Macho, MD      . sodium phosphate (FLEET) 7-19 GM/118ML enema 1  enema  1 enema Rectal Once PRN Kimberly Rea, MD      . traZODone (DESYREL) tablet 25 mg  25 mg Oral QHS PRN Kimberly Rea, MD      . DISCONTD: 0.9 %  sodium chloride infusion   Intravenous Once Brayton El, PA      . DISCONTD: dexamethasone (DECADRON) injection 40 mg  40 mg Intravenous Q24H Kimberly Macho, MD      . DISCONTD: fentaNYL (SUBLIMAZE) 0.05 MG/ML injection           .  DISCONTD: HYDROmorphone (DILAUDID) injection 1-2 mg  1-2 mg Intravenous Q2H PRN Kimberly Rea, MD   1 mg at 07/04/11 1321  . DISCONTD: ketorolac (TORADOL) 30 MG/ML injection 30 mg  30 mg Intravenous Once Kimberly Smitty Cords, MD      . DISCONTD: midazolam (VERSED) 2 MG/2ML injection              REVIEW OF SYSTEMS:  Pertinent items are noted in HPI.    PHYSICAL EXAM:  blood pressure is 127/63 and her pulse is 88. Her respiration is 20 and oxygen saturation is 98%.   Alert and oriented, clearly uncomfortable from her back pain. Head and neck examination: Grossly unremarkable. Nodes: There is no palpable cervical or supraclavicular adenopathy. Chest: Lungs clear. Back: There is palpable discomfort at approximately T7-T8. There is no palpable rib discomfort bilaterally. There is no palpable sacral discomfort. Breast: Without masses or lesions. Abdomen: Without hepatomegaly. Extremities: Without edema. Neurologic examination: Grossly nonfocal.   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 Special stains be available later this week. I believe that she is symptomatic  from her disease at T8 with radiation of pain along the left right T8 dermatomes. She is a history of pain radiating bilaterally from the sacrum, and this was her most symptomatic site prior to this past weekend. There is area of lytic disease along left femoral head that may be at risk for a pathologic fracture with further destruction and will need to be followed up after chemotherapy. My focus at this point would be to control her pain from her T8 vertebra and also irradiate her right sacrum where she has been symptomatic. I discussed the potential acute and late toxicities of radiation therapy and she wishes to proceed as outlined. She'll undergo simulation/treatment planning today and begin her radiation therapy tomorrow. Consent is signed.   PLAN: As discussed above. She'll begin her region therapy tomorrow. Chemotherapy in the near future under the direction of Dr. Myna Hidalgo.   I spent 55 minutes minutes face to face with the patient and more than 50% of that time was spent in counseling and/or coordination of care.

## 2011-07-04 NOTE — Progress Notes (Signed)
[facility] PROGRESS NOTE  [name] ZOX:[id number] DOB: 06-06-1955 DOA: 07/03/2011 PCP: [name], MD  Brief narrative: 56 y.o. pleasant female  admitted to [facility] for management of persistent  bone pain related to metastatic adenocarcinoma of undetermined primary origin.   (*Of note, patient has had bone scan on 06/22/2011 showing multiple areas of metastatic disease including the pelvis, spine (T8 vertebral body, L2 vertebral body and L4 vertebral body), left fourth rib, right sixth rib and left femoral head.  CT of the chest on 06/22/2011 showed metastatic disease within both lungs, bones, thoracic and lower cervical nodal stations and a left lung base mass perhaps representing a primary lung cancer.  CT scan abdomen showed a 1.4 cm low-density lesion within the liver suspicious for metastatic disease and in region of pelvis there are extensive metastatic lesions to bone including the right pubic symphysis a lytic lesion in the left femoral head of metastatic disease to the right side of the sacrum and adjacent iliac bone.  Assessment/Plan:  Principal Problem:  *Bone pain secondary to osseous metastasis likely metastatic adenocarcinoma of undetermined primary origin (possible lung or breast cancer) - plan is radiation therapy and patient has completed simulation today with goal of completing 10 sessions - pain regimen: dilaudid 2 mg every 2 hours PRN IV and fentanyl patch 25 mcg Q 72 hours - decardon 40 mg Q 24 hours IV - appreciate oncology following  Active Problems:  Pleural effusion - based on CT chest small left and right pleural effusion appears grossly stable  Anemia of chronic disease - secondary to malignancy - hemoglobin stable at 10.9  Abnormal LFTs - secondary to hepatic metastases  Consultants:  Oncology   Radiation oncology  Other consults:  Physical therapy  Procedures:  RT simulation 07/04/2011  Antibiotics:  none  Code  Status: full code Family Communication: updated at bedside patient and patient's husband Disposition Plan: home when more stable, pending PT evaluation  Manson Passey, MD  [facility] Pager [phone]  If 7PM-7AM, please contact night-coverage [email or web address] Password [facility] 07/04/2011, 8:42 PM   LOS: 1 day      Subjective: No significant events since admission, patient continues to be in pain.  Objective: Filed Vitals:   07/04/11 0356 07/04/11 0543 07/04/11 0954 07/04/11 1355  BP: 128/79 127/83 144/78 130/82  Pulse: 101 94 95 95  Temp: 98.6 F (37 C) 98.4 F (36.9 C) 98.4 F (36.9 C) 98.7 F (37.1 C)  TempSrc:  Oral Oral Oral  Resp: 16 16 16 16   Height:  5\' 2"  (1.575 m)    Weight:  83 kg (182 lb 15.7 oz)    SpO2:  98% 100% 100%    Intake/Output Summary (Last 24 hours) at 07/04/11 2042 Last data filed at 07/04/11 1900  Gross per 24 hour  Intake 1762.67 ml  Output    750 ml  Net 1012.67 ml    Exam:   General:  Pt is alert, follows commands appropriately, not in acute distress  Cardiovascular: Regular rate and rhythm, S1/S2, no murmurs, no rubs, no gallops  Respiratory: Clear to auscultation bilaterally, no wheezing, no crackles, no rhonchi  Abdomen: Soft, non tender, non distended, bowel sounds present, no guarding  Extremities: No edema, pulses DP and PT palpable bilaterally  Neuro: Grossly nonfocal  Data Reviewed: Basic Metabolic Panel:  Lab 07/03/11 8295 06/28/11 1517  NA 137 137  K 4.0 3.8  CL 97 101  CO2 26 26  GLUCOSE 114* 94  BUN 9 8  CREATININE 0.50 0.59  CALCIUM 9.5 9.7   Liver Function Tests:  Lab 06/28/11 1517  AST 41*  ALT 51*  ALKPHOS 422*  BILITOT 0.3  PROT 6.9  ALBUMIN 3.9   CBC:  Lab 07/04/11 0807 07/03/11 2354 07/03/11 0746 06/28/11 1517  WBC 8.9 12.2* 10.4 10.1*  NEUTROABS -- -- -- 6.7*  HGB 10.9* 11.7* 11.8* 11.8  HCT 33.3* 34.6* 35.9* 35.9  MCV 84.3 83.6 83.9 85  PLT 458* 500* 496* 531*      Studies: Dg Chest 1 View 07/03/2011    IMPRESSION: Less than 5% left apical pneumothorax.  The patient is asymptomatic on room air with pulse ox and 100%.    Dg Chest 2 View 07/04/2011   IMPRESSION:  1.  No significant change in tiny 1-2% left apical pneumothorax. 2.  Stable bilateral pulmonary metastases. 3.  Mild bibasilar atelectasis and tiny bilateral pleural effusions.    Ct Angio Chest W/cm &/or Wo Cm 07/04/2011  IMPRESSION:  1.  No evidence of pulmonary embolus, though the lung bases are not imaged on this study.  2.  Small right pleural effusion appears mildly increased in size, though the recent biopsy was on the left side.  Small left pleural effusion appears grossly stable.  New partial consolidation of both lower lung lobes likely reflects atelectasis. 3.  Known dominant mass at the left lung base is only partially characterized on this study; innumerable nodules of varying sizes again noted throughout both lungs, compatible with metastatic disease. 4.  Mediastinal lymphadenopathy and supraclavicular lymphadenopathy again seen; 3.3 cm mass again suspected within the left breast. 5.  Metastatic disease noted to vertebral body T8; expansile lytic lesion within the left posterior 5th rib.    Ct Biopsy 07/03/2011 IMPRESSION: Successful CT-guided left lower lobe lung mass core biopsy.     Scheduled Meds:   . dexamethasone (DECADRON) IVPB  40 mg Intravenous Q24H  . docusate sodium  100 mg Oral BID  . enoxaparin  40 mg Subcutaneous Q24H  . fentaNYL  25 mcg Transdermal Q72H  . HYDROmorphone      . ketorolac      . medroxyPROGESTERone  2.5 mg Oral Daily   Continuous Infusions:   . 0.9 % NaCl with KCl 20 mEq / L 50 mL/hr (07/04/11 0804)

## 2011-07-04 NOTE — Progress Notes (Signed)
3:30 pm gave pt Dilaudid 1 mg IM, left gluteal per order, Dr Dayton Scrape. Pt c/o pain 10/10 in ribs, midback.  3:55pm pt states "pain is easing but still 9". Pt to ct sim.

## 2011-07-04 NOTE — ED Notes (Signed)
Ambulated patient with O2 meter and Spo2 was 94. With pulse being 122. Resp. And nurse notified.

## 2011-07-04 NOTE — Telephone Encounter (Signed)
called the floor to let Irving Burton  RN know patient was given 1mg  dilaudid IM at 1530 by Colen Darling RN, and patient pain level is easing off still high-9, Irving Burton reported verbal understanding and will medicate patient after back from CT Sim 4:11 PM

## 2011-07-04 NOTE — ED Provider Notes (Signed)
History   This chart was scribed for Kimberly Hirt Smitty Cords, MD by [name]. The patient was seen in room MH11/MH11 and the patient's care was started at 12:16 AM     CSN: [id number]  Arrival date & time 07/03/11  2249   First MD Initiated Contact with Patient 07/04/11 0011      Chief Complaint  Patient presents with  . Back Pain    (Consider location/radiation/quality/duration/timing/severity/associated sxs/prior treatment) Patient is a 56 y.o. female presenting with back pain. The history is provided by the patient and the spouse.  Back Pain  This is a recurrent problem. The current episode started more than 2 days ago. The problem occurs constantly. The problem has been gradually worsening. The pain is associated with no known injury. The pain is present in the thoracic spine. The quality of the pain is described as stabbing. The pain does not radiate. The pain is at a severity of 10/10. The pain is severe. Exacerbated by: nothing. Pertinent negatives include no chest pain, no fever, no abdominal pain, no pelvic pain, no paresthesias, no paresis, no tingling and no weakness. She has tried nothing for the symptoms. The treatment provided no relief.    [name] is a 56 y.o. female who presents to the Emergency Department complaining of moderate, episodic back pain. The pt states "she recently underwent a biopsy procedure this morning at 7:30AM, back pain started hurting at 8:00AM." Pt informs the EDP that the procedure was done "through the skin." Pt rates the pain 10/10. Pt states the last time she took a dilaudid at home was 8:00PM. Modifying factors include taking vicodin last night (yesterday PM) which provided moderate relief. Pt has a hx of visiting [facility] for similar symptoms yesterday where which she was prescribed Dilaudid (Dr. Read Drivers).   Pt denies coughing up blood, abdominal pain.    PCP is Dr. Lenise Arena.    Past Medical History  Diagnosis Date  . Pulmonary nodules  06/22/11    per CT scan  . IBS (irritable bowel syndrome)   . Fibroadenoma of breast     hx of  . Metastasis of unknown origin     Past Surgical History  Procedure Date  . Lung biopsy      History  Substance Use Topics  . Smoking status: Never Smoker   . Smokeless tobacco: Never Used  . Alcohol Use: Yes     rarely    OB History    Grav Para Term Preterm Abortions TAB SAB Ect Mult Living                  Review of Systems  Constitutional: Negative for fever.  Respiratory: Negative for cough.   Cardiovascular: Negative for chest pain, palpitations and leg swelling.  Gastrointestinal: Negative for abdominal pain.  Genitourinary: Negative for pelvic pain.  Musculoskeletal: Positive for back pain and arthralgias.  Neurological: Negative for tingling, weakness and paresthesias.  All other systems reviewed and are negative.    10 Systems reviewed and all are negative for acute change except as noted in the HPI.    Allergies  Review of patient's allergies indicates no known allergies.  Home Medications   Current Outpatient Rx  Name Route Sig Dispense Refill  . ALPRAZOLAM 0.25 MG PO TABS Oral Take 0.25-0.5 mg by mouth every 8 (eight) hours as needed. For anxiety.    Marland Kitchen BISMUTH SUBSALICYLATE 262 MG PO CHEW Oral Chew 524 mg by mouth daily as needed. For indigestion.    Marland Kitchen  VITAMIN D 1000 UNITS PO TABS Oral Take 1,000 Units by mouth daily.    . CYCLOBENZAPRINE HCL 10 MG PO TABS Oral Take 1 tablet (10 mg total) by mouth 3 (three) times daily as needed for muscle spasms. 60 tablet 0  . DIPHENHYDRAMINE HCL 25 MG PO CAPS Oral Take 25 mg by mouth at bedtime as needed. For sleep.    Marland Kitchen ESTRADIOL 0.0375 MG/24HR TD PTTW Transdermal Place 1 patch onto the skin 2 (two) times a week. Apply on Tuesdays and on Saturdays.    Marland Kitchen HYDROMORPHONE HCL 2 MG PO TABS Oral Take 1 tablet (2 mg total) by mouth every 4 (four) hours as needed for pain. 30 tablet 0  . IBUPROFEN 200 MG PO TABS Oral Take 200  mg by mouth every 6 (six) hours as needed. For headache.    Marland Kitchen MEDROXYPROGESTERONE ACETATE 5 MG PO TABS Oral Take 2.5 mg by mouth daily.    . MULTIVITAMIN PO Oral Take 1 tablet by mouth every morning.     Marland Kitchen PROMETHAZINE HCL 12.5 MG PO TABS  Take 1-2, if needed, every 6 hours for nausea 60 tablet 3    BP 130/79  Pulse 101  Temp 98.7 F (37.1 C) (Oral)  Resp 16  Ht 5\' 2"  (1.575 m)  Wt 173 lb (78.472 kg)  BMI 31.64 kg/m2  SpO2 95%  Physical Exam  Nursing note and vitals reviewed. Constitutional: She is oriented to person, place, and time. She appears well-developed and well-nourished.  HENT:  Head: Atraumatic.  Right Ear: External ear normal.  Left Ear: External ear normal.  Nose: Nose normal.  Mouth/Throat: Oropharynx is clear and moist.  Eyes: EOM are normal. Pupils are equal, round, and reactive to light.  Neck: Normal range of motion. No tracheal deviation present.  Cardiovascular: Regular rhythm and normal heart sounds.  Tachycardia present.   Pulmonary/Chest: Effort normal. No respiratory distress. She has no rales.  Abdominal: Soft. Bowel sounds are normal. She exhibits no distension. There is no tenderness. There is no rebound and no guarding.  Musculoskeletal:       Band aid located at left midline, clean and dry. Rib #5 right posterior tenderness. No step offs.   Neurological: She is alert and oriented to person, place, and time. She displays normal reflexes (Intact. ).  Skin: Skin is warm and dry.  Psychiatric: She has a normal mood and affect. Her behavior is normal.    ED Course  Procedures (including critical care time)  DIAGNOSTIC STUDIES: Oxygen Saturation is 95% on room air, adequate by my interpretation.    COORDINATION OF CARE:    Results for orders placed during the hospital encounter of 07/03/11  CBC      Component Value Range   WBC 12.2 (*) 4.0 - 10.5 K/uL   RBC 4.14  3.87 - 5.11 MIL/uL   Hemoglobin 11.7 (*) 12.0 - 15.0 g/dL   HCT 16.1 (*) 09.6 -  46.0 %   MCV 83.6  78.0 - 100.0 fL   MCH 28.3  26.0 - 34.0 pg   MCHC 33.8  30.0 - 36.0 g/dL   RDW 04.5  40.9 - 81.1 %   Platelets 500 (*) 150 - 400 K/uL   Dg Chest 1 View  07/03/2011  *RADIOLOGY REPORT*  Clinical Data: Post biopsy  CHEST - 1 VIEW  Comparison: None.  Findings: Less than 5% left pneumothorax is noted.  Low volumes. Pulmonary nodules are noted bilaterally.  Upper normal heart size.  IMPRESSION: Less than 5% left apical pneumothorax.  The patient is asymptomatic on room air with pulse ox and 100%.  Original Report Authenticated By: Donavan Burnet, M.D.   Dg Chest 2 View  07/04/2011  *RADIOLOGY REPORT*  Clinical Data: Pulmonary metastases. Status post percutaneous healed biopsy of lung mass.  Worsening chest pain.  CHEST - 2 VIEW  Comparison: 07/03/2011  Findings: Tiny residual 1-2% left apical pneumothorax is again seen, and without significant change.  Multiple bilateral pulmonary metastases are again seen.  Mild bibasilar atelectasis is again seen as well as tiny bilateral pleural effusions.  Heart size remains stable.  IMPRESSION:  1.  No significant change in tiny 1-2% left apical pneumothorax. 2.  Stable bilateral pulmonary metastases. 3.  Mild bibasilar atelectasis and tiny bilateral pleural effusions.  Original Report Authenticated By: Danae Orleans, M.D.    Ct Biopsy  07/03/2011  *RADIOLOGY REPORT*  Clinical Data/Indication: LEFT LOWER LOBE LUNG MASS.  MULTIPLE LESIONS.  CT-GUIDED BIOPSY OF A LEFT LOWER LOBE LUNG MASS.  CORE.  Sedation: Versed 2 mg, Fentanyl 75 mcg.  Total Moderate Sedation Time: 20 minutes.  Procedure: The procedure, risks, benefits, and alternatives were explained to the patient. Questions regarding the procedure were encouraged and answered. The patient understands and consents to the procedure.  The posterior left thorax was prepped with betadine in a sterile fashion, and a sterile drape was applied covering the operative field. A sterile gown and sterile  gloves were used for the procedure.  Under CT guidance, the 17 gauge needle was inserted into the left lower lobe lung mass.  Four 18 gauge core biopsies were obtained. The guide needle was removed. Final imaging was performed.  Patient tolerated the procedure well without complication.  Vital sign monitoring by nursing staff during the procedure will continue as patient is in the special procedures unit for post procedure observation.  Findings: The images document guide needle placement within the left lower lobe lung mass.  Post biopsy images demonstrate a less than 5% left pneumothorax.  IMPRESSION: Successful CT-guided left lower lobe lung mass core biopsy.  Original Report Authenticated By: Donavan Burnet, M.D.      No diagnosis found.    MDM  O2 sat low on room air and tachycardia with any movement.  Suspect LAM as cause although lungs incompletely imaged.      I personally performed the services described in this documentation, which was scribed in my presence. The recorded information has been reviewed and considered.    Jasmine Awe, MD 07/04/11 [phone]

## 2011-07-04 NOTE — Consult Note (Signed)
#   [id number] is consult note.  Pain control is our main now. She will need long-term pain control medications. I will see about a Duragesic patch.  I will also give her some steroids to try to help with bony inflammation.  Will also give her Aredia or Zometa to help with preventing bone fracturing by this malignancy.  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 Special stains are being done.  We will continue to follow her closely and aid in her care.  We will pray hard for her. She is very nice.  Hebrews 12:12  Pete E.

## 2011-07-04 NOTE — ED Notes (Signed)
Pt ambulated 50 ft with no oxygen, pt extremely fatigued but oxygen sats remained around 92-94%, hr elevated to 120's. Pt's 02 sats remained in low 90's upon returning back to bed and hr decreased to high 90's. Oxygen decreased to 90%, 1l o2 placed back on patient

## 2011-07-04 NOTE — H&P (Signed)
PCP:   [name], MD   Oncologist: Raynelle Chary MD.  Chief Complaint:  Back pain x days  HPI: [name] is an 56 y.o. female. Previously healthy middle-aged Caucasian lady recently found to have pulmonary nodules, consulted Dr. Arlan Organ, of the had CT-guided biopsy yesterday by Dr. Bonnielee Haff, and is due to see radiation oncologist this afternoon regard to oh metastasis to the bone.  [name] has been having back pain which she receives oral Dilaudid, the pain became so severe last night that she went to [facility]. Her pain was adequately controlled at that facility, but they noted that she was hypoxic and tachycardic with walking and called the hospitalist service for admission to [facility].  A very small pneumothorax was also noted on CT scan at [facility]. She denies focal weakness Patient denies fever or cold, she does have a cough but no chest pains.  Denies dysuria frequency bloody or black stool. Denies difficulty breathing.  Patient says she feels pretty much at her baseline current  Rewiew of Systems:  The patient denies anorexia, fever,, vision loss, decreased hearing, hoarseness, chest pain, syncope, dyspnea on exertion, peripheral edema, balance deficits, hemoptysis, abdominal pain, melena, hematochezia, severe indigestion/heartburn, hematuria, incontinence, genital sores, muscle weakness, suspicious skin lesions, transient blindness, difficulty walking, depression, unusual weight change, abnormal bleeding, enlarged lymph nodes, angioedema, and breast masses.   Past Medical History  Diagnosis Date  . Pulmonary nodules 06/22/11    per CT scan  . IBS (irritable bowel syndrome)   . Fibroadenoma of breast     hx of  . Metastasis of unknown origin     Past Surgical History  Procedure Date  . Lung biopsy     Medications:  HOME MEDS: Prior to Admission medications   Medication Sig Start Date End Date Taking?  Authorizing Provider  ALPRAZolam (XANAX) 0.25 MG tablet Take 0.25-0.5 mg by mouth every 8 (eight) hours as needed. For anxiety.    Historical Provider, MD  bismuth subsalicylate (PEPTO BISMOL) 262 MG chewable tablet Chew 524 mg by mouth daily as needed. For indigestion.    Historical Provider, MD  cholecalciferol (VITAMIN D) 1000 UNITS tablet Take 1,000 Units by mouth daily.    Historical Provider, MD  cyclobenzaprine (FLEXERIL) 10 MG tablet Take 1 tablet (10 mg total) by mouth 3 (three) times daily as needed for muscle spasms. 07/03/11 07/13/11  Josph Macho, MD  diphenhydrAMINE (BENADRYL) 25 mg capsule Take 25 mg by mouth at bedtime as needed. For sleep.    Historical Provider, MD  estradiol (VIVELLE-DOT) 0.0375 MG/24HR Place 1 patch onto the skin 2 (two) times a week. Apply on Tuesdays and on Saturdays.    Historical Provider, MD  HYDROmorphone (DILAUDID) 2 MG tablet Take 1 tablet (2 mg total) by mouth every 4 (four) hours as needed for pain. 07/03/11 07/13/11  Carlisle Beers Molpus, MD  ibuprofen (ADVIL,MOTRIN) 200 MG tablet Take 200 mg by mouth every 6 (six) hours as needed. For headache.    Historical Provider, MD  medroxyPROGESTERone (PROVERA) 5 MG tablet Take 2.5 mg by mouth daily.    Historical Provider, MD  Multiple Vitamins-Minerals (MULTIVITAMIN PO) Take 1 tablet by mouth every morning.     Historical Provider, MD  promethazine (PHENERGAN) 12.5 MG tablet Take 1-2, if needed, every 6 hours for nausea 07/03/11   Josph Macho, MD     Allergies:  No Known Allergies  Social History:  reports that she has never smoked. She has never used smokeless tobacco. She reports that she drinks alcohol. She reports that she does not use illicit drugs.  Family History: Family History  Problem Relation Age of Onset  . Cancer Mother     cervical     Physical Exam: Filed Vitals:   07/04/11 0204 07/04/11 0254 07/04/11 0356 07/04/11 0543  BP: 138/75  128/79 127/83  Pulse: 92 99 101 94  Temp: 98.6 F  (37 C)  98.6 F (37 C) 98.4 F (36.9 C)  TempSrc: Oral   Oral  Resp: 14 12 16 16   Height:    5\' 2"  (1.575 m)  Weight:    83 kg (182 lb 15.7 oz)  SpO2: 100% 94%  98%   Blood pressure 127/83, pulse 94, temperature 98.4 F (36.9 C), temperature source Oral, resp. rate 16, height 5\' 2"  (1.575 m), weight 83 kg (182 lb 15.7 oz), SpO2 98.00%.  GEN:  Pleasant but somewhat apprehensive middle-aged Caucasian lady lying in the bed in no acute distress; cooperative with exam PSYCH:  alert and oriented x4; does appear anxious; affect is appropriate. HEENT: Mucous membranes pink, dry, and anicteric; PERRLA; EOM intact; no cervical lymphadenopathy nor thyromegaly or carotid bruit; no JVD; Breasts:: Not examined CHEST WALL: No tenderness; back of chest was not examined because patient says she has a lot of pain when trying to sit CHEST: Normal respiration, clear to auscultation bilaterally HEART: Regular rate and rhythm; no murmurs rubs or gallops BACK: No kyphosis or scoliosis; no CVA tenderness ABDOMEN: Obese, soft non-tender; no masses, no organomegaly, normal abdominal bowel sounds; no pannus; no intertriginous candida. Rectal Exam: Not done EXTREMITIES:  age-appropriate arthropathy of the hands and knees; no edema; no ulcerations. Genitalia: not examined PULSES: 2+ and symmetric SKIN: Normal hydration no rash or ulceration CNS: Cranial nerves 2-12 grossly intact no focal lateralizing neurologic deficit   Labs & Imaging Results for orders placed during the hospital encounter of 07/03/11 (from the past 48 hour(s))  CBC     Status: Abnormal   Collection Time   07/03/11 11:54 PM      Component Value Range Comment   WBC 12.2 (*) 4.0 - 10.5 K/uL    RBC 4.14  3.87 - 5.11 MIL/uL    Hemoglobin 11.7 (*) 12.0 - 15.0 g/dL    HCT 16.1 (*) 09.6 - 46.0 %    MCV 83.6  78.0 - 100.0 fL    MCH 28.3  26.0 - 34.0 pg    MCHC 33.8  30.0 - 36.0 g/dL    RDW 04.5  40.9 - 81.1 %    Platelets 500 (*) 150 - 400  K/uL   BASIC METABOLIC PANEL     Status: Abnormal   Collection Time   07/03/11 11:54 PM      Component Value Range Comment   Sodium 137  135 - 145 mEq/L    Potassium 4.0  3.5 - 5.1 mEq/L    Chloride 97  96 - 112 mEq/L    CO2 26  19 - 32 mEq/L    Glucose, Bld 114 (*) 70 - 99 mg/dL    BUN 9  6 - 23 mg/dL    Creatinine, Ser 9.14  0.50 - 1.10 mg/dL    Calcium 9.5  8.4 - 78.2 mg/dL    GFR calc non Af Amer >90  >90 mL/min    GFR calc Af Amer >90  >90 mL/min    Dg Chest  1 View  07/03/2011  *RADIOLOGY REPORT*  Clinical Data: Post biopsy  CHEST - 1 VIEW  Comparison: None.  Findings: Less than 5% left pneumothorax is noted.  Low volumes. Pulmonary nodules are noted bilaterally.  Upper normal heart size.  IMPRESSION: Less than 5% left apical pneumothorax.  The patient is asymptomatic on room air with pulse ox and 100%.  Original Report Authenticated By: Donavan Burnet, M.D.   Dg Chest 2 View  07/04/2011  *RADIOLOGY REPORT*  Clinical Data: Pulmonary metastases. Status post percutaneous healed biopsy of lung mass.  Worsening chest pain.  CHEST - 2 VIEW  Comparison: 07/03/2011  Findings: Tiny residual 1-2% left apical pneumothorax is again seen, and without significant change.  Multiple bilateral pulmonary metastases are again seen.  Mild bibasilar atelectasis is again seen as well as tiny bilateral pleural effusions.  Heart size remains stable.  IMPRESSION:  1.  No significant change in tiny 1-2% left apical pneumothorax. 2.  Stable bilateral pulmonary metastases. 3.  Mild bibasilar atelectasis and tiny bilateral pleural effusions.  Original Report Authenticated By: Danae Orleans, M.D.   Ct Angio Chest W/cm &/or Wo Cm  07/04/2011  *RADIOLOGY REPORT*  Clinical Data: Shortness of breath and right-sided chest pain. Leukocytosis.  Recently diagnosed metastatic disease to the lungs and osseous structures, of unknown origin; lung biopsy performed 07/03/2011.  CT ANGIOGRAPHY CHEST  Technique:  Multidetector CT  imaging of the chest using the standard protocol during bolus administration of intravenous contrast. Multiplanar reconstructed images including MIPs were obtained and reviewed to evaluate the vascular anatomy.  Contrast: OMNIPAQUE IOHEXOL 350 MG/ML SOLN  Comparison: Chest radiograph performed 07/03/2011, and CT of the chest performed 06/22/2011  Findings: There is no evidence of pulmonary embolus, though the lung bases are not imaged on this study.  The patient's small right pleural effusion appears mildly increased in size, though the biopsy was on the left side.  The small left pleural effusion appears grossly stable.  The known dominant mass at the left lung base is only partially imaged on this study.  Innumerable nodules of varying sizes are noted throughout both lungs, compatible with metastatic disease.  There is partial consolidation of both lower lung lobes, new from the prior study and likely reflecting atelectasis.  There is no evidence of pneumothorax.  Scattered mediastinal lymphadenopathy is seen, with a 2.5 x 1.7 cm periaortic node, 2.5 x 1.5 cm subcarinal node and 1.9 x 1.4 cm azygoesophageal recess node.  No pericardial effusion is seen.  The great vessels are within normal limits.  No axillary lymphadenopathy is seen.  The visualized portions of the thyroid gland are unremarkable in appearance.  Bilateral supraclavicular lymphadenopathy is seen, measuring up to 1.7 cm on the left side.  The visualized portions of the liver are unremarkable.  As before, a 3.3 x 2.6 cm mass is suspected within the left breast; this is only characterized on coronal images.  A poorly characterized lytic lesion is again identified within vertebral body T8, and a large expansile lytic lesion is again noted within the left posterior 5th rib.  IMPRESSION:  1.  No evidence of pulmonary embolus, though the lung bases are not imaged on this study.  2.  Small right pleural effusion appears mildly increased in size,  though the recent biopsy was on the left side.  Small left pleural effusion appears grossly stable.  New partial consolidation of both lower lung lobes likely reflects atelectasis. 3.  Known dominant mass at the  left lung base is only partially characterized on this study; innumerable nodules of varying sizes again noted throughout both lungs, compatible with metastatic disease. 4.  Mediastinal lymphadenopathy and supraclavicular lymphadenopathy again seen; 3.3 cm mass again suspected within the left breast. 5.  Metastatic disease noted to vertebral body T8; expansile lytic lesion within the left posterior 5th rib.  Original Report Authenticated By: Tonia Ghent, M.D.   Ct Biopsy  07/03/2011  *RADIOLOGY REPORT*  Clinical Data/Indication: LEFT LOWER LOBE LUNG MASS.  MULTIPLE LESIONS.  CT-GUIDED BIOPSY OF A LEFT LOWER LOBE LUNG MASS.  CORE.  Sedation: Versed 2 mg, Fentanyl 75 mcg.  Total Moderate Sedation Time: 20 minutes.  Procedure: The procedure, risks, benefits, and alternatives were explained to the patient. Questions regarding the procedure were encouraged and answered. The patient understands and consents to the procedure.  The posterior left thorax was prepped with betadine in a sterile fashion, and a sterile drape was applied covering the operative field. A sterile gown and sterile gloves were used for the procedure.  Under CT guidance, the 17 gauge needle was inserted into the left lower lobe lung mass.  Four 18 gauge core biopsies were obtained. The guide needle was removed. Final imaging was performed.  Patient tolerated the procedure well without complication.  Vital sign monitoring by nursing staff during the procedure will continue as patient is in the special procedures unit for post procedure observation.  Findings: The images document guide needle placement within the left lower lobe lung mass.  Post biopsy images demonstrate a less than 5% left pneumothorax.  IMPRESSION: Successful CT-guided  left lower lobe lung mass core biopsy.  Original Report Authenticated By: Donavan Burnet, M.D.      Assessment Present on Admission:  .Metastasis to bone .Pleural effusion .Lung cancer .Cancer associated pain   PLAN: We'll continue management of this patient's pain; we'll hydrate as she seems somewhat dehydrated; Will discuss her condition with her oncologist later today decision will be taken to either discharge or continued outpatient management; Possible consideration for starting steroids for spinal metastases metastases  Other plans as per orders.   Shenea Giacobbe 07/04/2011, 6:44 AM

## 2011-07-04 NOTE — Progress Notes (Signed)
Simulation/treatment planning note: (Inpatient)  After being premedicated the patient was taken to the CT simulator. An alpha cradle was constructed for immobilization. Her chest and abdomen/pelvis was scanned. The first isocenter was set up on the body of T8. She is sensitive to AP PA fields. 2 separate multileaf collimators were designed to conform the field. She was then set up to the right sacrum/pelvis with a separate isocenter. She was setup AP and PA. 2 separate multileaf collimators were designed to conform the field for a total of 5 complex treatment devices. I prescribing 3000 cGy in 10 sessions to both sites. Isodose plans are requested along with dosimetry.

## 2011-07-04 NOTE — Progress Notes (Signed)
Please see the Nurse Progress Note in the MD Initial Consult Encounter for this patient. 

## 2011-07-04 NOTE — Consult Note (Signed)
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 female who is seen today after being admitted to [facility] for control of her bone pain related to metastatic adenocarcinoma of undetermined primary origin. She states that she first developed low back pain approximately 4 months ago. Her pain radiated to both hips. This became worse and she was seen by her primary care physician, Dr. [name], who obtained a plain x-rays and a bone scan on 06/22/2011 showing multiple areas of metastatic disease including the pelvis, spine (T8 vertebral body, L2 vertebral body and L4 vertebral body), left fourth rib, right sixth rib and left femoral head. CT of the chest on 06/22/2011 showed metastatic disease within both lungs, bones, thoracic and lower cervical nodal stations and a left lung base mass perhaps representing a primary lung cancer. Scans of the abdomen showed a 1.4 cm low-density lesion within the liver suspicious for metastatic disease. Scans of the pelvis showed extensive metastatic disease to bone including the right pubic symphysis a lytic lesion in the left femoral head of metastatic disease to the right side of the sacrum and adjacent iliac bone. Metastatic disease was seen at the T8, L1, and L4 vertebra. Her bone pain worsen and she was admitted to [facility] for pain management. She describes pain along the T8 dermatome as being her worst pain and this radiates to the  left and right. This pain began approximately 4 days ago. Prior to this her pain was most severe along her or spine, radiating bilaterally, more so to her right hip. A CT-guided biopsy of her left lung mass yesterday was diagnostic for adenocarcinoma. I spoke with Dr. Luisa Hart who is running special stains to determine the primary site. Of note is that her CT scan from 06/22/2011 did mention soft tissue fullness with a left breast and suggested that mammography to be helpful. [facility]. She tells me she does have a history of fibrocystic disease.   PREVIOUS RADIATION THERAPY: No     PAST MEDICAL HISTORY:  has a past medical history of Pulmonary nodules (06/22/11); IBS (irritable bowel syndrome); Fibroadenoma of breast; and Metastasis of unknown origin.       PAST SURGICAL HISTORY:   Past Surgical History   Procedure  Date   .  Lung biopsy            FAMILY HISTORY: family history includes Cancer in her mother. Her mother is alive at age 32. Her father died from complications of asthma at age 63.     SOCIAL HISTORY:  reports that she has never smoked. She has never used smokeless tobacco. She reports that she drinks alcohol. She reports that she does not use illicit drugs. Married, 2  sons. 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     ALLERGIES: Review of patient's allergies indicates no known allergies.     MEDICATIONS:   Current Facility-Administered Medications   Medication  Dose  Route  Frequency  Provider  Last Rate  Last Dose   .  HYDROmorphone (DILAUDID) injection 1 mg   1 mg  Intramuscular  Once  Maryln Gottron, MD             No current outpatient prescriptions on file.       Facility-Administered Medications Ordered in Other Encounters   Medication  Dose  Route  Frequency  Provider  Last Rate  Last Dose   .  0.9 % NaCl with KCl 20 mEq/ L   infusion     Intravenous  Continuous  Vania Rea, MD  50 mL/hr at 07/04/11 0804  50 mL/hr at 07/04/11 0804   .  acetaminophen (TYLENOL) tablet 650 mg   650 mg  Oral  Q4H PRN  Vania Rea, MD           Or   .  acetaminophen (TYLENOL) suppository 650 mg   650 mg  Rectal  Q6H PRN  Vania Rea, MD         .  ALPRAZolam Prudy Feeler) tablet 0.25-0.5 mg   0.25-0.5 mg  Oral  Q8H PRN  Vania Rea, MD         .  bisacodyl (DULCOLAX) EC tablet 5 mg   5 mg  Oral  Daily PRN  Vania Rea, MD         .  cyclobenzaprine (FLEXERIL) tablet 10 mg   10 mg  Oral  TID PRN  Vania Rea, MD         .  dexamethasone (DECADRON) 40 mg in sodium chloride 0.9 % 50 mL IVPB   40 mg  Intravenous  Q24H  Josph Macho, MD         .  docusate sodium (COLACE) capsule 100 mg   100 mg  Oral  BID  Vania Rea, MD     100 mg at 07/04/11 1027   .  enoxaparin (LOVENOX) injection 40 mg   40 mg  Subcutaneous  Q24H  Vania Rea, MD     40 mg at 07/04/11 1027   .  fentaNYL (DURAGESIC - dosed mcg/hr) patch 25 mcg   25 mcg  Transdermal  Q72H  Josph Macho, MD         .  fentaNYL (SUBLIMAZE) injection 100 mcg   100 mcg  Intravenous  Once  April K Palumbo-Rasch, MD     100 mcg at 07/04/11 0014   .  HYDROmorphone (DILAUDID) 2 MG/ML injection              1 mg at 07/04/11 0139   .  HYDROmorphone (DILAUDID) injection 1 mg   1 mg  Intravenous  Once  April K Palumbo-Rasch, MD         .  HYDROmorphone (DILAUDID) injection 2-4 mg   2-4 mg  Intravenous  Q2H PRN  Josph Macho, MD         .  iohexol (OMNIPAQUE) 350 MG/ML injection 100 mL   100 mL  Intravenous  Once PRN  Medication Radiologist, MD     100 mL at 07/04/11 0211   .  ketorolac (TORADOL) 30 MG/ML injection  30 mg at 07/04/11 0138   .  medroxyPROGESTERone (PROVERA) tablet 2.5 mg   2.5 mg  Oral  Daily  Vania Rea, MD     2.5 mg at 07/04/11 1027   .  ondansetron (ZOFRAN) tablet 4 mg   4 mg  Oral  Q6H PRN  Vania Rea, MD            Or   .  ondansetron [facility]) injection 4 mg   4 mg  Intravenous  Q6H PRN  Vania Rea, MD         .  oxyCODONE (Oxy IR/ROXICODONE) immediate release tablet 5 mg   5 mg  Oral  Q4H PRN  Vania Rea, MD     5 mg at 07/04/11 1408   .  pamidronate (AREDIA) 90 mg in sodium chloride 0.9 % 500 mL IVPB   90 mg  Intravenous  Once  Josph Macho, MD         .  sodium phosphate (FLEET) 7-19 GM/118ML enema 1 enema   1 enema  Rectal  Once PRN  Vania Rea, MD         .  traZODone (DESYREL) tablet 25 mg   25 mg  Oral  QHS PRN  Vania Rea, MD         .  DISCONTD: 0.9 %  sodium chloride infusion     Intravenous  Once  Brayton El, PA         .  DISCONTD: dexamethasone (DECADRON) injection 40 mg   40 mg  Intravenous  Q24H  Josph Macho, MD         .  DISCONTD: fentaNYL (SUBLIMAZE) 0.05 MG/ML injection                  .  DISCONTD: HYDROmorphone (DILAUDID) injection 1-2 mg   1-2 mg  Intravenous  Q2H PRN  Vania Rea, MD     1 mg at 07/04/11 1321   .  DISCONTD: ketorolac (TORADOL) 30 MG/ML injection 30 mg   30 mg  Intravenous  Once  April Smitty Cords, MD         .  DISCONTD: midazolam (VERSED) 2 MG/2ML injection                         REVIEW OF SYSTEMS:  Pertinent items are noted in HPI.      PHYSICAL EXAM:  blood pressure is 127/63 and her pulse is 88. Her respiration is 20 and oxygen saturation is 98%.   Alert and oriented, clearly uncomfortable from her back pain. Head and neck examination: Grossly unremarkable. Nodes: There is no palpable cervical or supraclavicular adenopathy. Chest: Lungs clear. Back: There is palpable discomfort at approximately T7-T8. There is no palpable rib discomfort bilaterally. There is no palpable sacral discomfort. Breast: Without masses or lesions. Abdomen: Without hepatomegaly. Extremities: Without edema. Neurologic examination: Grossly nonfocal.     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 Special stains be available later this week. I believe that she is symptomatic from her disease at T8 with radiation of pain along the left right T8 dermatomes. She is a history of pain radiating bilaterally from the sacrum, and this was her most symptomatic site prior to this past weekend. There is area of lytic disease along left femoral head that may be at risk for a pathologic fracture with further destruction and will need to be followed up after chemotherapy. My focus at this point would be to control her pain from her T8 vertebra and also irradiate her right sacrum where she has been symptomatic. I discussed the potential acute and late toxicities of radiation therapy and she wishes to proceed as outlined. She'll undergo simulation/treatment planning today and begin her radiation therapy tomorrow. Consent is signed.     PLAN: As discussed above. She'll begin her region therapy tomorrow. Chemotherapy in the near future under the direction of Dr. Myna Hidalgo.    I spent 55 minutes minutes face to face with the patient and more than 50% of that time was spent in counseling and/or coordination of care.

## 2011-07-04 NOTE — ED Notes (Signed)
Long discusses with patient and patient's husband regarding pain control, at home methods discussed, IE...warm baths, explained that she may have to work with meds to determine a regimen that works for her.

## 2011-07-05 ENCOUNTER — Ambulatory Visit
Admission: RE | Admit: 2011-07-05 | Discharge: 2011-07-05 | Disposition: A | Discharge status: Home / Self Care | Payer: BC Managed Care – PPO | Source: Ambulatory Visit | Attending: Radiation Oncology | Admitting: Radiation Oncology

## 2011-07-05 ENCOUNTER — Encounter: Payer: Self-pay | Admitting: Radiation Oncology

## 2011-07-05 ENCOUNTER — Ambulatory Visit
Admit: 2011-07-05 | Discharge: 2011-07-05 | Disposition: A | Discharge status: Home / Self Care | Payer: BC Managed Care – PPO | Attending: Radiation Oncology | Admitting: Radiation Oncology

## 2011-07-05 ENCOUNTER — Telehealth: Payer: Self-pay | Admitting: *Deleted

## 2011-07-05 DIAGNOSIS — K5900 Constipation, unspecified: Secondary | ICD-10-CM

## 2011-07-05 DIAGNOSIS — D638 Anemia in other chronic diseases classified elsewhere: Secondary | ICD-10-CM | POA: Diagnosis present

## 2011-07-05 DIAGNOSIS — C7951 Secondary malignant neoplasm of bone: Secondary | ICD-10-CM

## 2011-07-05 DIAGNOSIS — R112 Nausea with vomiting, unspecified: Secondary | ICD-10-CM

## 2011-07-05 DIAGNOSIS — C3490 Malignant neoplasm of unspecified part of unspecified bronchus or lung: Secondary | ICD-10-CM

## 2011-07-05 DIAGNOSIS — C7952 Secondary malignant neoplasm of bone marrow: Secondary | ICD-10-CM

## 2011-07-05 LAB — BASIC METABOLIC PANEL
Chloride: 98 mEq/L (ref 96–112)
GFR calc Af Amer: >90 mL/min (ref >90)
Potassium: 4.2 mEq/L (ref 3.5–5.1)
Sodium: 134 mEq/L — ABNORMAL LOW (ref 135–145)

## 2011-07-05 LAB — CBC
HCT: 35.2 % — ABNORMAL LOW (ref 36.0–46.0)
Hemoglobin: 11.5 g/dL — ABNORMAL LOW (ref 12.0–15.0)
RBC: 4.2 MIL/uL (ref 3.87–5.11)
RDW: 14 % (ref 11.5–15.5)
WBC: 9.7 10*3/uL (ref 4.0–10.5)

## 2011-07-05 MED ORDER — POLYETHYLENE GLYCOL 3350 17 G PO PACK
17.0000 g | PACK | Freq: Every day | ORAL | Status: DC
  Administered 2011-07-05 – 2011-07-07 (×2): 17 g via ORAL
  Filled 2011-07-05 (×3): qty 1

## 2011-07-05 MED ORDER — OXYCODONE HCL 5 MG PO TABS
5.0000 mg | ORAL_TABLET | ORAL | Status: DC | PRN
  Administered 2011-07-05: 5 mg via ORAL
  Administered 2011-07-05 – 2011-07-07 (×3): 10 mg via ORAL
  Filled 2011-07-05 (×4): qty 2

## 2011-07-05 MED ORDER — DIPHENHYDRAMINE HCL 25 MG PO CAPS
25.0000 mg | ORAL_CAPSULE | Freq: Four times a day (QID) | ORAL | Status: DC | PRN
  Administered 2011-07-05: 25 mg via ORAL
  Filled 2011-07-05: qty 1

## 2011-07-05 NOTE — Progress Notes (Signed)
PROGRESS NOTE  ANNORA GUDERIAN ZOX:[id number] DOB: [birth year] DOA: 07/03/2011 PCP: [name], MD  Brief narrative: Ms. Oman is a 56 year old female with a past medical history of metastatic cancer of uncertain primary and pulmonary nodules status post CT-guided biopsy on 07/03/2011 who presented to the hospital on 07/04/2011 with complaints of uncontrolled back pain. Upon initial evaluation, she was noted to be hypoxic and tachycardic and therefore was referred for admission. Her back pain was felt to be due to metastatic adenocarcinoma of undetermined primary origin.  Dr. Myna Hidalgo has been working up her malignancy. She also has been seen by the radiation oncologist for palliative radiotherapy.  Interim History: Stable overnight.  Assessment/Plan: Principal Problem:  *Metastasis to bone, uncertain primary and cancer associated back pain from pathologic lumbar vertebra fracture  The patient was admitted and put on aggressive pain control measures including a fentanyl patch, oral oxycodone and IV Dilaudid-HP for breakthrough pain and high-dose decadron for anti-inflammatory effects.  She underwent radiation simulation on 07/04/2011 in anticipation of palliative radiotherapy.  Pathology from her CT-guided lung biopsy on 07/03/2011 shows adenocarcinoma with special staining pending.  Dr. Myna Hidalgo following for further recommendations regarding cancer treatment. Active Problems:  Constipation  Start daily MiraLAX.  Pleural effusion  Noted incidentally on CT scanning of the chest with bilateral effusions.  Respiratory status stable.  Anemia of chronic disease  Hemoglobin stable.  Abnormal LFTs  Secondary to liver metastasis.   Code Status: Full Family Communication: None at bedside. Disposition Plan: Home, when stable.  Medical Consultants:  Dr. Arlan Organ, Oncology  Dr. Madaline Savage, Radiation Oncology  Other consultants:  None.  Antibiotics:  None   Subjective  Ms. Ueda is a little constipated but otherwise is doing well. Her pain is much better with occasional breakthrough pain in the lower back and hip. No nausea or vomiting.   Objective   Objective: Filed Vitals:   07/05/11 0205 07/05/11 0256 07/05/11 0600 07/05/11 1000  BP: 143/85 118/77 116/73   Pulse: 111 96 82 120  Temp: 98.1 F (36.7 C) 98.3 F (36.8 C) 98.2 F (36.8 C)   TempSrc: Oral Oral Oral   Resp: 18 16 16    Height:      Weight:   80.196 kg (176 lb 12.8 oz)   SpO2: 90% 93% 93% 94%    Intake/Output Summary (Last 24 hours) at 07/05/11 1052 Last data filed at 07/05/11 0900  Gross per 24 hour  Intake 1640.67 ml  Output   1250 ml  Net 390.67 ml    Exam: Gen:  NAD Cardiovascular:  RRR, No M/R/G Respiratory: Lungs CTAB Gastrointestinal: Abdomen soft, NT/ND with normal active bowel sounds. Extremities: No C/E/C  Data Reviewed: Basic Metabolic Panel:  Lab 07/05/11 8119 07/04/11 0807 07/03/11 2354 06/28/11 1517  NA 134* -- 137 137  K 4.2 -- 4.0 --  CL 98 -- 97 101  CO2 25 -- 26 26  GLUCOSE 151* -- 114* 94  BUN 9 -- 9 8  CREATININE 0.48* 0.47* 0.50 0.59  CALCIUM 9.0 -- 9.5 9.7  MG -- -- -- --  PHOS -- -- -- --   GFR Estimated Creatinine Clearance: 76.1 ml/min (by C-G formula based on Cr of 0.48). Liver Function Tests:  Lab 06/28/11 1517  AST 41*  ALT 51*  ALKPHOS 422*  BILITOT 0.3  PROT 6.9  ALBUMIN 3.9   Coagulation profile  Lab 07/03/11 0746 06/28/11 1517  INR 1.02 1.1*  PROTIME -- 13.2    CBC:  Lab 07/05/11 0523 07/04/11 0807 07/03/11 2354 07/03/11 0746 06/28/11 1517  WBC 9.7 8.9 12.2* 10.4 10.1*  NEUTROABS -- -- -- -- 6.7*  HGB 11.5* 10.9* 11.7* 11.8* 11.8  HCT 35.2* 33.3* 34.6* 35.9* 35.9  MCV 83.8 84.3 83.6 83.9 85  PLT 511* 458* 500* 496* 531*    Procedures and Diagnostic Studies:  Dg Chest 1 View 07/03/2011 IMPRESSION: Less than 5% left apical pneumothorax.  The patient is asymptomatic on room air with pulse ox and  100%.  Original Report Authenticated By: Donavan Burnet, M.D.    Dg Chest 2 View 07/04/2011 IMPRESSION:  1.  No significant change in tiny 1-2% left apical pneumothorax. 2.  Stable bilateral pulmonary metastases. 3.  Mild bibasilar atelectasis and tiny bilateral pleural effusions.  Original Report Authenticated By: Danae Orleans, M.D.    Ct Angio Chest W/cm &/or Wo Cm 07/04/2011 IMPRESSION:  1.  No evidence of pulmonary embolus, though the lung bases are not imaged on this study.  2.  Small right pleural effusion appears mildly increased in size, though the recent biopsy was on the left side.  Small left pleural effusion appears grossly stable.  New partial consolidation of both lower lung lobes likely reflects atelectasis. 3.  Known dominant mass at the left lung base is only partially characterized on this study; innumerable nodules of varying sizes again noted throughout both lungs, compatible with metastatic disease. 4.  Mediastinal lymphadenopathy and supraclavicular lymphadenopathy again seen; 3.3 cm mass again suspected within the left breast. 5.  Metastatic disease noted to vertebral body T8; expansile lytic lesion within the left posterior 5th rib.  Original Report Authenticated By: Tonia Ghent, M.D.    Mr Cervical Spine W Wo Contrast 07/05/2011 IMPRESSION: Metastatic lesion involving the C7 vertebra with slight extension of the spinal canal without neural impingement.  No pathologic fracture.  Original Report Authenticated By: Gwynn Burly, M.D.    Mr Thoracic Spine W Wo Contrast 07/05/2011 IMPRESSION:  1. Extensive dural metastases in the thoracic spine compressing the thecal sac slightly. 2.  Slight pathologic fractures of the superior endplate of T8 and the inferior endplate of L1.  Original Report Authenticated By: Gwynn Burly, M.D.    Mr Lumbar Spine W Wo Contrast 07/05/2011 IMPRESSION:  1.  Metastatic lesions and L1 and L4 with a slight pathologic fracture of the inferior aspect  of L1 without neural impingement. Slight protrusion of the posterior margin of L1 into the spinal canal. 2.  Metastatic disease at L4 extends into the superior aspect of the left neural foramen affecting the left L4 nerve.  This should cause a left L4 radiculopathy. 3.  Destructive lesion in the right side of S1 and in the adjacent ilium.  No focal neural impingement. 4.  Dural metastases in the lower thoracic and upper lumbar spine.  Original Report Authenticated By: Gwynn Burly, M.D.    Nm Bone Scan Whole Body 06/22/2011 IMPRESSION: Bony metastatic disease.  There is marked abnormal uptake in the skeleton and corresponding to the suspicious bony lesions seen on CT.  The nuclear medicine bone scan also shows a suspicious area within a right rib, not definitely seen on CT.  Original Report Authenticated By: Britta Mccreedy, M.D.    Scheduled Meds:   . dexamethasone (DECADRON) IVPB  40 mg Intravenous Q24H  . docusate sodium  100 mg Oral BID  . enoxaparin  40 mg Subcutaneous Q24H  . fentaNYL  25 mcg Transdermal Q72H  . medroxyPROGESTERone  2.5 mg Oral Daily  . pamidronate  90 mg Intravenous Once  . DISCONTD: dexamethasone  40 mg Intravenous Q24H   Continuous Infusions:   . 0.9 % NaCl with KCl 20 mEq / L 50 mL/hr at 07/05/11 0930      LOS: 2 days   Hillery Aldo, MD Pager [phone]  07/05/2011, 10:52 AM

## 2011-07-05 NOTE — Addendum Note (Signed)
Encounter addended by: Glennie Hawk, RN on: 07/05/2011  8:13 AM<BR>     Documentation filed: Inpatient MAR

## 2011-07-05 NOTE — Evaluation (Signed)
Physical Therapy Evaluation Patient Details Name: [name] MRN: [id number] DOB: [birth year] Today's Date: 07/05/2011 Time: 0925-1000 PT Time Calculation (min): 35 min  PT Assessment / Plan / Recommendation Clinical Impression  56 y.o. female with metastatic cancer admitted with uncontrolled back pain, hypoxia, and tachycardia. Pt reports good pain control with pain patch, pain level 1/10 at rest and with walking with RW. Encouraged pt to walk in halls with RW TID.  SaO2 94% on RA with walking, HR 120 with walking. Likely no follow up PT needed, may need RW for home. Will follow.     PT Assessment  Patient needs continued PT services    Follow Up Recommendations  No PT follow up    Barriers to Discharge None      lEquipment Recommendations  Rolling walker with 5" wheels    Recommendations for Other Services     Frequency Min 3X/week    Precautions / Restrictions Precautions Precautions: None Restrictions Weight Bearing Restrictions: No   Pertinent Vitals/Pain *1/10 R lower and mid back pain with walking and at rest 94% SaO2 with walking on RA, HR 120 with walking, 103 at rest**      Mobility  Bed Mobility Bed Mobility: Rolling Left;Left Sidelying to Sit Rolling Left: 6: Modified independent (Device/Increase time);With rail Left Sidelying to Sit: 6: Modified independent (Device/Increase time);With rails;HOB flat Details for Bed Mobility Assistance: VCs for log roll technique Transfers Transfers: Sit to Stand;Stand to Sit Sit to Stand: 6: Modified independent (Device/Increase time);With upper extremity assist;From bed Stand to Sit: To chair/3-in-1;6: Modified independent (Device/Increase time);With upper extremity assist;With armrests Details for Transfer Assistance: VCs hand placement Ambulation/Gait Ambulation/Gait Assistance: 5: Supervision Ambulation Distance (Feet): 400 Feet Assistive device: Rolling walker Ambulation/Gait Assistance Details: Sa O2 94% on  RA with walking, HR 120 with walking, HR 103 at rest Gait Pattern: Within Functional Limits    Exercises     PT Diagnosis: Generalized weakness;Acute pain  PT Problem List: Pain PT Treatment Interventions: DME instruction;Stair training;Gait training;Functional mobility training;Patient/family education   PT Goals Acute Rehab PT Goals PT Goal Formulation: With patient/family Time For Goal Achievement: 07/19/11 Potential to Achieve Goals: Good Pt will Roll Supine to Left Side: Independently PT Goal: Rolling Supine to Left Side - Progress: Goal set today Pt will go Supine/Side to Sit: Independently;with HOB 0 degrees PT Goal: Supine/Side to Sit - Progress: Goal set today Pt will go Sit to Stand: with modified independence;with upper extremity assist PT Goal: Sit to Stand - Progress: Goal set today Pt will Ambulate: >150 feet;with modified independence;with least restrictive assistive device PT Goal: Ambulate - Progress: Goal set today Pt will Go Up / Down Stairs: 1-2 stairs;with min assist;with supervision PT Goal: Up/Down Stairs - Progress: Goal set today  Visit Information  Last PT Received On: 07/05/11 Assistance Needed: +1    Subjective Data  Subjective: The pain is much better than it was. It feels good to get up and walk.  Patient Stated Goal: return home   Prior Functioning  Home Living Lives With: Spouse Available Help at Discharge: Family Type of Home: House Home Access: Stairs to enter Secretary/administrator of Steps: 2 Entrance Stairs-Rails: None Home Layout: One level Home Adaptive Equipment: None Prior Function Level of Independence: Independent Able to Take Stairs?: Yes Driving: Yes Vocation: Full time employment Comments: desk job at [employer] Communication: No difficulties    Cognition  Overall Cognitive Status: Appears within functional limits for tasks assessed/performed Arousal/Alertness: Psychiatrist  Level:  Appears intact for tasks assessed Behavior During Session: [facility] for tasks performed    Extremity/Trunk Assessment Right Upper Extremity Assessment RUE ROM/Strength/Tone: [facility] for tasks assessed Left Upper Extremity Assessment LUE ROM/Strength/Tone: WFL for tasks assessed Right Lower Extremity Assessment RLE ROM/Strength/Tone: Within functional levels RLE Sensation: WFL - Light Touch Left Lower Extremity Assessment LLE ROM/Strength/Tone: Within functional levels LLE Sensation: WFL - Light Touch LLE Coordination: WFL - gross/fine motor Trunk Assessment Trunk Assessment: Normal   Balance Balance Balance Assessed: Yes Static Sitting Balance Static Sitting - Balance Support: Bilateral upper extremity supported;Feet supported Static Sitting - Level of Assistance: 6: Modified independent (Device/Increase time) Static Sitting - Comment/# of Minutes: 4  End of Session PT - End of Session Equipment Utilized During Treatment: Gait belt Activity Tolerance: Patient tolerated treatment well Patient left: in chair;with call bell/phone within reach;with family/visitor present Nurse Communication: Mobility status   Ralene Bathe Kistler 07/05/2011, 10:09 AM [phone]

## 2011-07-05 NOTE — Progress Notes (Signed)
[facility] Radiation Oncology Dept Therapy Treatment Record Phone [phone]   Radiation Therapy was administered to [name] on: 07/05/2011  1:14 PM and was treatment # 1 out of a planned course of 10 treatments.

## 2011-07-05 NOTE — Progress Notes (Signed)
Patient ID: [name], female   DOB: [birth year], 56 y.o.   MRN: [id number]  MRI of the thoracic and lumbar spine have been reviewed with Dr. Corliss Skains at the request of Dr. Myna Hidalgo.  T8, L1 and L4 amendable to ablation/vertebroplasty procedure with moderate sedation - tentatively scheduled with him on Friday at [facility].  Procedure details, benefits and risks discussed with this patient and her spouse with their apparent understanding and are anxious to proceed. Benefits specifically for this patient would be that of pain control and stabilization of fracture.   Insurance is being verified at this time before finalizing schedule.  Patient states that she had an acute onset of pain on Saturday without known injury.  Her pain became so severe she had to come to the hospital for pain control. She rates her pain as a 10 on a scale of 1-10. She had persistent extreme pain in her back and right hip area with sharp exacerbations with movement consistent with spasms. Recent lung biopsy shows adenocarcinoma. Fractures are pathological in appearance.  ROS: negative for headaches, seizures, CP, SOB, wheezing, sputum production, bleeding problems, incontinence, LE radiculopathy or falls, or significant weight loss.  Positive for pain in the mid thoracic, lower back and right hip area, and constipation.    PE: 97.9, 117/78, HR: 100, Resp: 16, 02 sats : 96 percent on RA    Comfortable in bed currently with pain fairly controlled except with abrupt movements.  Patient has significant tenderness to the T8 and Lumbar areas consistent with L1 and L4 area.  She has bandlike radicular pain around the chest which corresponds to the T8 area.  She denies any LE radicular symptoms of incontinence of bowels or urine.  She has full range of motion of all four extremities without evidence of weakness. CV: RRR without murmurs rubs or gallops. Resp: CTA bilaterally.   Labs : Results for Kimberly, Moreno (MRN [id number]) as of  07/05/2011 16:01  Ref. Range 07/03/2011 07:46  Prothrombin Time Latest Range: 11.6-15.2 seconds 13.6  INR Latest Range: 0.00-1.49  1.02   Results for Kimberly, Moreno (MRN [id number]) as of 07/05/2011 16:01  Ref. Range 07/05/2011 05:23  WBC Latest Range: 3.9-10.0 10e3/uL 9.7  RBC Latest Range: 3.87-5.11 MIL/uL 4.20  Hemoglobin Latest Range: 12.0-15.0 g/dL 08.6 (L)  HCT Latest Range: 36.0-46.0 % 35.2 (L)  MCV Latest Range: 78.0-100.0 fL 83.8  MCH Latest Range: 26.0-34.0 pg 27.4  MCHC Latest Range: 30.0-36.0 g/dL 57.8  RDW Latest Range: 11.5-15.5 % 14.0  Platelets Latest Range: 150-400 K/uL 511 (H)   Impression/plan : pathological compression fractures affecting T8, L1 and L4 amendable to VP ablation procedure tentatively scheduled for Friday with Dr. Corliss Skains pending insurance approval.  IR to see patient tomorrow for final information and order placement if all approved. A brochure along with images provided for patient today to review at her leisure.   Total time with patient was approximately 25 minutes.

## 2011-07-05 NOTE — Consult Note (Signed)
[name], MEDER NO.:  [id number]  MEDICAL RECORD NO.:  [id number]  LOCATION:  1418                         FACILITY:  [facility]  PHYSICIAN:  Josph Macho, M.D.  DATE OF BIRTH:  [birth year]  DATE OF CONSULTATION: DATE OF DISCHARGE:                                CONSULTATION   REASON FOR CONSULTATION: 1. Metastatic unknown primary adenocarcinoma. 2. Uncontrolled back pain secondary to metastatic disease.  HISTORY OF PRESENT ILLNESS:  [name] is a very charming 56 year old white female.  She initially was seen by me back on June 28, 2011.  She basically presented with metastatic disease.  She presented with the lesions in the lung, bone, and liver.  She was referred to me by Dr. [name].  We went ahead and had set her up with the biopsies.  On July 03, 2011, she did undergo a left lung biopsy.  I got a call from the pathologist today stating that this was adenocarcinoma.  They will start doing their special immunostains to try to identify primary site.  He feels, however, that the primary site would likely be in the GI tract.  She is having pain in the right hip area.  She is on Vicodin for this, however, her pain has gotten progressively worse.  She is very tough, however, she just could not stand the pain any longer.  She denies any kind of trauma.  There has been no falling.  She subsequently was admitted by Dr. Orvan Falconer.  We were then called to help with management issues.  She says the pain is in the lower part of her back.  It does not radiate.  She has no associated chest pain.  There is no cough.  She has had no problems with bowels or bladder.  When she was admitted, her laboratory studies looked relatively unremarkable.  She did have some thrombocytosis with a platelet count of 500.  Her calcium was 9.5 with descent BUN and creatinine.  When we initially saw her, her prealbumin was only 12.4.  Her liver function tests were  minimally elevated.  Her LDH was 191.  She had a chest x-ray done on admission.  This showed a 1-2% left apical pneumothorax, it else looked okay.  Her appetite is down.  She has had no nausea or vomiting.  She has had little constipation because of her pain medications.  PAST MEDICAL HISTORY:  Remarkable for; 1. Irritable bowel irritable bowel syndrome. 2. Fibroadenoma of the breast.  ALLERGIES:  NONE.  ADMISSION MEDICATION: 1. Xanax 0.25-0.5 mg q.8 h. p.r.n. 2. Vitamin D 1000 units p.o. q. day. 3. Flexeril 10 mg p.o. t.i.d. p.r.n. 4. Vivelle patch 0.0375 mg twice a week. 5. Dilaudid 2-4 mg p.o. q.4 h. p.r.n. 6. Motrin as needed. 7. Phenergan 12.5 mg p.o. q.6 h. p.r.n. nausea.  SOCIAL HISTORY:  Negative for tobacco or alcohol use.  FAMILY HISTORY:  Remarkable for the cervical cancer in her mother.  PHYSICAL EXAMINATION:  GENERAL:  This is a well-developed, well- nourished white female, in mild distress secondary to pain. VITAL SIGNS:  Show temperature 98.4, pulse 95, respiratory rate 16, blood pressure is 130/82. HEENT:  Head shows a  normocephalic, atraumatic skull.  There are no ocular or oral lesions.  There are no palpable cervical or supraclavicular lymph nodes. LUNGS:  Clear to percussion and auscultation bilaterally. CARDIAC:  Regular rate and rhythm with normal S1 and S2.  There are no murmurs, rubs, or bruits. ABDOMEN:  Soft.  Good bowel sounds.  There is no fluid wave.  There is no palpable abdominal mass.  There is no palpable hepatosplenomegaly. BACK:  Show some tenderness over the thoracic and lumbosacral spine. She has some spasms in the paravertebral musculature. EXTREMITIES:  Shows no clubbing, cyanosis, or edema.  IMPRESSION:  [name] is a 56 year old female with metastatic adenocarcinoma of unknown primary.  Again, immunostains are not back yet.  We will have to await these.  I may actually see about sending the biopsy specimen for DNA profiling.   If the immunostains do not give [place] a good idea as to where the primary site is, we may do a molecular profiling, which could give [place] a better idea of the primary site.  Our main concern now is pain control.  I really need to make sure that we treat her aggressively.  She will need some long-acting pain medication.  We will see about a fentanyl patch for her.  She will definitely need radio Zometa.  I will give her a dose while she is in the hospital.  I have already spoken to Radiation Oncology.  They will see her and hopefully get radiation started, try to help with her pain.  I suspect that Ms. Flinchum will be in the hospital for several days, if not longer.  We really need to get her pain under better control so that she is functional and can then be considered for discharge.     Josph Macho, M.D.     PRE/MEDQ  D:  07/04/2011  T:  07/05/2011  Job:  [id number]  cc:   [name], M.D. Fax: [phone]  Billie Lade, Ph.D., M.D. Fax: [phone]

## 2011-07-05 NOTE — Telephone Encounter (Signed)
Spoke w/Vera RN for pt in room 1333 re: pt's radiation tx time 1:00 pm today. Requested she assess pt for pain at 12:40 pm and medicate. Advised her pt will have to lie on solid tx table minimum of 30 min, RT unable to pad table, and pt must not move during this time. She states pt wearing Duragesic patch, has not required Dilaudid today. She states pt rated her pain 1/10 this morning. Dwana Curd states she will medicate pt as needed prior to tx today. She is aware transporters will come for pt approx 12:40 pm. Notified Linac #3.

## 2011-07-05 NOTE — Progress Notes (Signed)
Post sim ed completed w/husband while pt receiving radiation tx. He states "pt can't remember anything; she is groggy." Gave Mr Freyre "Radiation and You" booklet; all questions answered. He verbalized understanding. He knows to ask dr or RN if any questions.

## 2011-07-05 NOTE — Progress Notes (Signed)
Simulation verification note: The patient underwent simulation verification for treatment to her lower thoracic spine (T8) and also LS-spine (L4/right sacrum). Her isocenters were in good position and the multileaf collimators contoured the treatment volume appropriately.

## 2011-07-05 NOTE — Progress Notes (Signed)
Ms. Cothern is doing much better this morning. Her pain is much better controlled. I think the steroids probably had as much of an effect as anything we've done. That she is on a fentanyl patch at 25 mcg. I prayer she did see radiation oncology yesterday. I will start her on radiation treatments.  I did look at the MRI. I she does have disease within her vertebral bodies. I did speak with Dr. Corliss Skains  from interventional radiology. I think that ablation vertebroplasty would be a good idea to try to help with respect to pain control.  The pathologist called me today. He stated that the immunostains looked as if this was a primary lung cancer. I don't believe that Ms. Grilli never smoked. If she did it was quite a while back. As such, it is critical that we send off the ALK and EGRF mutation assays. She actually might be worthy to patients who does have a mutation and could respond to one of our oral agents.    I would continue to follow in the hospital so that we can adjust her pain medications.   She did get her Aredia to help with her bone metastases.  Her physical exam is pretty much unchanged. Her vital signs are all stable. Her lungs are clear. Cardiac exam regular rate and rhythm with no murmurs rubs or bruits. Abdominal exam soft good bowel sounds. There is no fluid wave. There is no palpable pedal splenomegaly. Extremities shows no clubbing cyanosis or edema. She has good range of motion of her joints. She has good strength in her legs. Neurological exam shows no focal neurological deficits.    We will continue to follow her. I very much appreciate the hospitalist excellent care.   Pete E.

## 2011-07-06 ENCOUNTER — Encounter (HOSPITAL_COMMUNITY): Payer: Self-pay | Admitting: Anesthesiology

## 2011-07-06 ENCOUNTER — Ambulatory Visit: Admit: 2011-07-06 | Payer: Self-pay | Admitting: Interventional Radiology

## 2011-07-06 ENCOUNTER — Encounter: Payer: Self-pay | Admitting: Radiation Oncology

## 2011-07-06 ENCOUNTER — Inpatient Hospital Stay (HOSPITAL_COMMUNITY): Payer: BC Managed Care – PPO | Admitting: Anesthesiology

## 2011-07-06 ENCOUNTER — Encounter (HOSPITAL_COMMUNITY)
Admission: EM | Disposition: A | Discharge status: Home / Self Care | Payer: Self-pay | Source: Home / Self Care | Attending: Internal Medicine

## 2011-07-06 ENCOUNTER — Ambulatory Visit (HOSPITAL_COMMUNITY)
Admission: RE | Admit: 2011-07-06 | Discharge: 2011-07-06 | Disposition: A | Discharge status: Home / Self Care | Payer: BC Managed Care – PPO | Source: Ambulatory Visit | Attending: Interventional Radiology | Admitting: Interventional Radiology

## 2011-07-06 ENCOUNTER — Encounter (HOSPITAL_COMMUNITY): Payer: Self-pay | Admitting: Radiology

## 2011-07-06 ENCOUNTER — Ambulatory Visit: Payer: BC Managed Care – PPO

## 2011-07-06 DIAGNOSIS — C7951 Secondary malignant neoplasm of bone: Secondary | ICD-10-CM

## 2011-07-06 DIAGNOSIS — C7952 Secondary malignant neoplasm of bone marrow: Secondary | ICD-10-CM

## 2011-07-06 DIAGNOSIS — C3490 Malignant neoplasm of unspecified part of unspecified bronchus or lung: Secondary | ICD-10-CM

## 2011-07-06 DIAGNOSIS — K5900 Constipation, unspecified: Secondary | ICD-10-CM

## 2011-07-06 DIAGNOSIS — R112 Nausea with vomiting, unspecified: Secondary | ICD-10-CM

## 2011-07-06 LAB — SURGICAL PCR SCREEN: Staphylococcus aureus: NEGATIVE

## 2011-07-06 SURGERY — RADIOLOGY WITH ANESTHESIA
Anesthesia: General

## 2011-07-06 MED ORDER — PROPOFOL 10 MG/ML IV EMUL
INTRAVENOUS | Status: DC | PRN
  Administered 2011-07-06: 200 mg via INTRAVENOUS

## 2011-07-06 MED ORDER — DEXAMETHASONE SODIUM PHOSPHATE 4 MG/ML IJ SOLN
INTRAMUSCULAR | Status: DC | PRN
  Administered 2011-07-06: 10 mg via INTRAVENOUS

## 2011-07-06 MED ORDER — FENTANYL CITRATE 0.05 MG/ML IJ SOLN
INTRAMUSCULAR | Status: DC | PRN
  Administered 2011-07-06 (×2): 50 ug via INTRAVENOUS
  Administered 2011-07-06: 100 ug via INTRAVENOUS

## 2011-07-06 MED ORDER — LACTATED RINGERS IV SOLN
INTRAVENOUS | Status: DC | PRN
  Administered 2011-07-06: 12:00:00 via INTRAVENOUS

## 2011-07-06 MED ORDER — MUPIROCIN 2 % EX OINT
TOPICAL_OINTMENT | CUTANEOUS | Status: AC
  Administered 2011-07-06: 10:00:00
  Filled 2011-07-06: qty 22

## 2011-07-06 MED ORDER — ENOXAPARIN SODIUM 40 MG/0.4ML ~~LOC~~ SOLN
40.0000 mg | SUBCUTANEOUS | Status: DC
  Administered 2011-07-06: 40 mg via SUBCUTANEOUS
  Filled 2011-07-06 (×2): qty 0.4

## 2011-07-06 MED ORDER — ROCURONIUM BROMIDE 100 MG/10ML IV SOLN
INTRAVENOUS | Status: DC | PRN
  Administered 2011-07-06: 40 mg via INTRAVENOUS

## 2011-07-06 MED ORDER — CEFAZOLIN SODIUM 1-5 GM-% IV SOLN
INTRAVENOUS | Status: DC | PRN
  Administered 2011-07-06: 2 g via INTRAVENOUS

## 2011-07-06 MED ORDER — ENOXAPARIN SODIUM 40 MG/0.4ML ~~LOC~~ SOLN
40.0000 mg | SUBCUTANEOUS | Status: DC
  Filled 2011-07-06: qty 0.4

## 2011-07-06 MED ORDER — HYDROMORPHONE HCL PF 1 MG/ML IJ SOLN
0.2500 mg | INTRAMUSCULAR | Status: DC | PRN
  Administered 2011-07-06 (×4): 0.5 mg via INTRAVENOUS

## 2011-07-06 MED ORDER — LACTATED RINGERS IV SOLN
INTRAVENOUS | Status: DC
  Administered 2011-07-06: 11:00:00 via INTRAVENOUS

## 2011-07-06 MED ORDER — ONDANSETRON HCL 4 MG/2ML IJ SOLN
INTRAMUSCULAR | Status: DC | PRN
  Administered 2011-07-06: 4 mg via INTRAVENOUS

## 2011-07-06 MED ORDER — SODIUM CHLORIDE 0.9 % IV SOLN
INTRAVENOUS | Status: AC
  Administered 2011-07-06: 18:00:00 via INTRAVENOUS

## 2011-07-06 MED ORDER — MIDAZOLAM HCL 5 MG/5ML IJ SOLN
INTRAMUSCULAR | Status: DC | PRN
  Administered 2011-07-06: 2 mg via INTRAVENOUS

## 2011-07-06 MED ORDER — LIDOCAINE HCL (CARDIAC) 20 MG/ML IV SOLN
INTRAVENOUS | Status: DC | PRN
  Administered 2011-07-06: 40 mg via INTRAVENOUS

## 2011-07-06 NOTE — Procedures (Signed)
S/P T8,L1 and L4 tumor ablation followed by augmentation for painful pathlogical compression fractures.Under GA

## 2011-07-06 NOTE — Preoperative (Signed)
Beta Blockers   Reason not to administer Beta Blockers:NA 

## 2011-07-06 NOTE — Progress Notes (Signed)
Weekly Management Note:  Site:Thoracic spine (T8), L-S spine (L4-sacrum) Current Dose:  300  cGy Projected Dose: 3000  cGy  Narrative: The patient is seen today for routine under treatment assessment. CBCT/MVCT images/port films were reviewed. The chart was reviewed.   She seen today after her vertebroplasties. She was over [facility] today and was not treated. She has back discomfort as expected.  Physical Examination: There were no vitals filed for this visit..  Weight:  . No change.  Impression: Tolerating radiation therapy well. We will resume her radiation therapy tomorrow.  Plan: Continue radiation therapy as planned.

## 2011-07-06 NOTE — H&P (Signed)
Chief Complaint: Back pain Referring Physician:Ennever, Rama HPI: [name] is an 56 y.o. female. Patient states that she had an acute onset of pain on Saturday without known injury. Her pain became so severe she had to come to the hospital for pain control. She rates her pain as a 10 on a scale of 1-10. She had persistent extreme pain in her back and right hip area with sharp exacerbations with movement consistent with spasms. Recent lung biopsy shows adenocarcinoma. Fractures are pathological in appearance.  MRI of the thoracic and lumbar spine have been reviewed with Dr. Corliss Skains at the request of Dr. Myna Hidalgo. T8, L1 and L4 amendable to ablation/vertebroplasty procedure with general anesthesia. Procedure details, benefits and risks discussed with this patient and her spouse with their apparent understanding and are anxious to proceed. Benefits specifically for this patient would be that of pain control and stabilization of fracture. Insurance has been verified.  Past Medical History:  Past Medical History  Diagnosis Date  . Pulmonary nodules 06/22/11    per CT scan  . IBS (irritable bowel syndrome)   . Fibroadenoma of breast     hx of  . Metastasis of unknown origin     Past Surgical History:  Past Surgical History  Procedure Date  . Lung biopsy     Family History:  Family History  Problem Relation Age of Onset  . Cancer Mother     cervical    Social History:  reports that she has never smoked. She has never used smokeless tobacco. She reports that she drinks alcohol. She reports that she does not use illicit drugs.  Allergies: No Known Allergies  Medications: Medications Prior to Admission  Medication Sig Dispense Refill  . bismuth subsalicylate (PEPTO BISMOL) 262 MG chewable tablet Chew 524 mg by mouth daily as needed. For indigestion.      . cyclobenzaprine (FLEXERIL) 10 MG tablet Take 1 tablet (10 mg total) by mouth 3 (three) times daily as needed for muscle  spasms.  60 tablet  0  . diphenhydrAMINE (BENADRYL) 25 mg capsule Take 25 mg by mouth at bedtime as needed. For sleep.      Marland Kitchen estradiol (VIVELLE-DOT) 0.0375 MG/24HR Place 1 patch onto the skin 2 (two) times a week. Apply on Tuesdays and on Saturdays.      Marland Kitchen HYDROmorphone (DILAUDID) 2 MG tablet Take 1 tablet (2 mg total) by mouth every 4 (four) hours as needed for pain.  30 tablet  0  . ibuprofen (ADVIL,MOTRIN) 200 MG tablet Take 200 mg by mouth every 6 (six) hours as needed. For headache.      . medroxyPROGESTERone (PROVERA) 5 MG tablet Take 2.5 mg by mouth daily.      . promethazine (PHENERGAN) 12.5 MG tablet Take 1-2, if needed, every 6 hours for nausea  60 tablet  3  . ALPRAZolam (XANAX) 0.25 MG tablet Take 0.25-0.5 mg by mouth every 8 (eight) hours as needed. For anxiety.        ROS: ROS: negative for headaches, seizures, CP, SOB, wheezing, sputum production, bleeding problems, incontinence, LE radiculopathy or falls, or significant weight loss. Positive for pain in the mid thoracic, lower back and right hip area, and constipation.      Physical Exam: Blood pressure 154/82, pulse 108, temperature 98.1 F (36.7 C), temperature source Oral, resp. rate 20, height 5\' 2"  (1.575 m), weight 176 lb 12.9 oz (80.2 kg), SpO2 93.00%. Body mass index is 32.34 kg/(m^2).   General Appearance:  Alert, cooperative, no distress, appears stated age  Head:  Normocephalic, without obvious abnormality, atraumatic  ENT: Unremarkable  Neck: Supple, symmetrical, trachea midline, no adenopathy, thyroid: not enlarged, symmetric, no tenderness/mass/nodules  Lungs:   Clear to auscultation bilaterally, no w/r/r, respirations unlabored without use of accessory muscles.  Chest Wall:  No tenderness or deformity  Heart:  Regular rate and rhythm, S1, S2 normal, no murmur, rub or gallop. Carotids 2+ without bruit.  Abdomen:   Soft, non-tender, non distended. Bowel sounds active all four quadrants,  no masses, no  organomegaly.  Extremities: Extremities normal, atraumatic, no cyanosis or edema  Neurologic: Normal affect, no gross deficits.   Results for orders placed during the hospital encounter of 07/03/11 (from the past 48 hour(s))  BASIC METABOLIC PANEL     Status: Abnormal   Collection Time   07/05/11  5:23 AM      Component Value Range Comment   Sodium 134 (*) 135 - 145 mEq/L    Potassium 4.2  3.5 - 5.1 mEq/L    Chloride 98  96 - 112 mEq/L    CO2 25  19 - 32 mEq/L    Glucose, Bld 151 (*) 70 - 99 mg/dL    BUN 9  6 - 23 mg/dL    Creatinine, Ser 2.13 (*) 0.50 - 1.10 mg/dL    Calcium 9.0  8.4 - 08.6 mg/dL    GFR calc non Af Amer >90  >90 mL/min    GFR calc Af Amer >90  >90 mL/min   CBC     Status: Abnormal   Collection Time   07/05/11  5:23 AM      Component Value Range Comment   WBC 9.7  4.0 - 10.5 K/uL    RBC 4.20  3.87 - 5.11 MIL/uL    Hemoglobin 11.5 (*) 12.0 - 15.0 g/dL    HCT 57.8 (*) 46.9 - 46.0 %    MCV 83.8  78.0 - 100.0 fL    MCH 27.4  26.0 - 34.0 pg    MCHC 32.7  30.0 - 36.0 g/dL    RDW 62.9  52.8 - 41.3 %    Platelets 511 (*) 150 - 400 K/uL    Prothrombin Time/INR 13.2/1.1 PTT 32  Mr Cervical Spine W Wo Contrast  07/05/2011  *RADIOLOGY REPORT*  Clinical Data: Severe back pain.  Metastatic cancer with unknown primary.  MRI CERVICAL SPINE WITHOUT AND WITH CONTRAST  Technique:  Multiplanar and multiecho pulse sequences of the cervical spine, to include the craniocervical junction and cervicothoracic junction, were obtained according to standard protocol without and with intravenous contrast.  Contrast: 16mL MULTIHANCE GADOBENATE DIMEGLUMINE 529 MG/ML IV SOLN  Comparison: Bone scan dated 06/22/2011  Findings: Scan extends from above the top of the clivus through T2- 3.  The visualized intracranial contents are normal.  Cervical spinal cord is normal.  There is a metastatic lesion involving the C7 vertebral body and extending into the right pedicle and right side of the posterior  elements of C7.  The tumor slightly extends through the right posterior margin of the vertebral body into the spinal canal without impingement upon the spinal cord without visible impingement upon the right C6 or C7 nerves.  There is no pathologic fracture.  The remainder of the cervical spine demonstrates no significant abnormality.  There is slight diffuse arthritis of the facet joints in the cervical spine.  No disc protrusions or significant disc bulges.  The paraspinal soft tissues are normal.  After contrast administration only pathologic enhancement is at the C7 vertebral body and posterior elements.  IMPRESSION: Metastatic lesion involving the C7 vertebra with slight extension of the spinal canal without neural impingement.  No pathologic fracture.  Original Report Authenticated By: Gwynn Burly, M.D.   Mr Thoracic Spine W Wo Contrast  07/05/2011  *RADIOLOGY REPORT*  Clinical Data: Severe back pain.  Metastatic cancer of unknown primary.  MRI THORACIC SPINE WITHOUT AND WITH CONTRAST  Technique:  Multiplanar and multiecho pulse sequences of the thoracic spine were obtained without and with intravenous contrast.  Contrast: 16mL MULTIHANCE GADOBENATE DIMEGLUMINE 529 MG/ML IV SOLN  Comparison: Bone scan dated 06/22/2011  Findings: The patient has extensive dural metastases throughout the thoracic spinal canal with some compression of the thecal sac particularly from the T4-2 T11 levels.  Osseous metastases are present in the spinous process of T2, the vertebral body of T4, the T8 vertebral body where there is a slight pathologic compression fracture of the superior endplate without protrusion of bone or disc material into the spinal canal and at L1 with a pathologic fracture of the inferior endplate.  The posterior margin of  L1 extends slightly into the spinal canal without neural impingement.  There is a destructive expansile metastasis in the posterior aspect the left fourth rib.  Moderate right and  small left pleural effusions and numerous metastatic lesions in the lungs are noted.  IMPRESSION:  1. Extensive dural metastases in the thoracic spine compressing the thecal sac slightly. 2.  Slight pathologic fractures of the superior endplate of T8 and the inferior endplate of L1.  Original Report Authenticated By: Gwynn Burly, M.D.   Mr Lumbar Spine W Wo Contrast  07/05/2011  *RADIOLOGY REPORT*  Clinical Data: Severe back pain.  Metastatic disease of unknown origin.  MRI LUMBAR SPINE WITHOUT AND WITH CONTRAST  Technique:  Multiplanar and multiecho pulse sequences of the lumbar spine were obtained without and with intravenous contrast.  Contrast: 16mL MULTIHANCE GADOBENATE DIMEGLUMINE 529 MG/ML IV SOLN  Comparison: Bone scan dated 06/22/2011  Findings: Scan extends from T11-12 through S4.  Tip of the conus is at L1-2.  Distal spinal cord appears normal.  Metastatic disease involves the entire L1 vertebral body with a pathologic fracture of the inferior endplate with slight protrusion of the posterior margin of the vertebral body in the spinal canal without neural impingement.  There is metastatic disease throughout most of the L4 vertebral body extending into the left pedicle. Tumor extends into the superior aspect of the left neural foramen at L4 and affects the exiting left L4 nerve.  There is also metastatic disease in the right side of the S1 segment of the sacrum and in the adjacent right ilium.  After contrast administration there is abnormal enhancement of the dura in the lower thoracic spine and at the level of the tip of the conus.   IMPRESSION:  1.  Metastatic lesions and L1 and L4 with a slight pathologic fracture of the inferior aspect of L1 without neural impingement. Slight protrusion of the posterior margin of L1 into the spinal canal. 2.  Metastatic disease at L4 extends into the superior aspect of the left neural foramen affecting the left L4 nerve.  This should cause a left L4  radiculopathy. 3.  Destructive lesion in the right side of S1 and in the adjacent ilium.  No focal neural impingement. 4.  Dural metastases in the lower thoracic and upper lumbar spine.  Original Report Authenticated  By: Gwynn Burly, M.D.    Assessment/Plan MRI of the thoracic and lumbar spine have been reviewed with Dr. Corliss Skains at the request of Dr. Myna Hidalgo. T8, L1 and L4 amendable to ablation/vertebroplasty procedure Reviewed procedure with pt and husband. Consent signed in chart. Labs ok. Lovenox stopped yesterday. Will be transported to [facility] at 1000 for procedure today.  Brayton El PA-C 07/06/2011, 8:20 AM

## 2011-07-06 NOTE — Progress Notes (Signed)
Pt in pain upon arrival to pacu.  Retrived fentanyl  100 mcg for crna [name] Shaggy,  at her request. Pt received 50 mcg at 1455, and 50 mcg at 1505 per crna.  Will monitor.

## 2011-07-06 NOTE — Anesthesia Procedure Notes (Signed)
Procedure Name: Intubation Date/Time: 07/06/2011 12:38 PM Performed by: Rossie Muskrat L Pre-anesthesia Checklist: Timeout performed, Emergency Drugs available, Suction available, Patient being monitored and Patient identified Patient Re-evaluated:Patient Re-evaluated prior to inductionOxygen Delivery Method: Circle system utilized Preoxygenation: Pre-oxygenation with 100% oxygen Intubation Type: IV induction Ventilation: Mask ventilation without difficulty Laryngoscope Size: Miller and 2 Grade View: Grade I Tube size: 7.5 mm Number of attempts: 1 Airway Equipment and Method: Stylet Placement Confirmation: ETT inserted through vocal cords under direct vision,  breath sounds checked- equal and bilateral and positive ETCO2 Secured at: 20 cm Tube secured with: Tape Dental Injury: Teeth and Oropharynx as per pre-operative assessment

## 2011-07-06 NOTE — Anesthesia Preprocedure Evaluation (Signed)
Anesthesia Evaluation  Patient identified by MRN, date of birth, ID band Patient awake    Reviewed: Allergy & Precautions, H&P , NPO status , Patient's Chart, lab work & pertinent test results  Airway Mallampati: II TM Distance: >3 FB Neck ROM: Full    Dental No notable dental hx. (+) Teeth Intact, Chipped and Dental Advisory Given   Pulmonary  Adeno CA with bony mets to the spine and pelvis breath sounds clear to auscultation  Pulmonary exam normal       Cardiovascular negative cardio ROS  Rhythm:Regular Rate:Normal     Neuro/Psych negative neurological ROS  negative psych ROS   GI/Hepatic negative GI ROS, Neg liver ROS,   Endo/Other  negative endocrine ROS  Renal/GU negative Renal ROS  negative genitourinary   Musculoskeletal   Abdominal   Peds  Hematology negative hematology ROS (+)   Anesthesia Other Findings   Reproductive/Obstetrics negative OB ROS                           Anesthesia Physical Anesthesia Plan  ASA: II  Anesthesia Plan: General   Post-op Pain Management:    Induction: Intravenous  Airway Management Planned: Oral ETT  Additional Equipment:   Intra-op Plan:   Post-operative Plan: Extubation in OR  Informed Consent: I have reviewed the patients History and Physical, chart, labs and discussed the procedure including the risks, benefits and alternatives for the proposed anesthesia with the patient or authorized representative who has indicated his/her understanding and acceptance.   Dental advisory given  Plan Discussed with: CRNA  Anesthesia Plan Comments:         Anesthesia Quick Evaluation

## 2011-07-06 NOTE — Progress Notes (Signed)
Mrs. Brozowski continues to do well with the Decadron. She's also doing well with the Duragesic patch.  She is going to go for kyphoplasty today. This I think is an excellent idea and should help provide good pain control in the immediate and long-term.  She started her radiation therapy. She is doing well with this although she is only had a big one treatment to date.  The pathologist called me and told me that the immunostains Are fairly conclusive for this adenocarcinoma to be of lung primary. This I think is very fortunate as Ms. Casique never smoked and as such, she might be one of the 5% of patients who might have the ALK or EGFR mutation and we can use either Zalkori or Tarceva.  Her physical exam is unchanged. Vital signs are all stable.  Hopefully, should be oh to go home in one or 2 days. I will not get the  gene mutation studies back for another 5 or 6 days.  I was glad to see that she is improving and that she is more functional.  I spent a good 25-30 minutes with her today talking to her about the possibilities considering that this looks like lung cancer. I explained to her that if she does not have these gene mutations, then we'll going to have to use chemotherapy and that she will need a Port-A-Cath to be placed.  Pete E.

## 2011-07-06 NOTE — Progress Notes (Signed)
PROGRESS NOTE  [name] QIO:[id number] DOB: Jun 08, 1955 DOA: 07/03/2011 PCP: [name], MD  Brief narrative: [name] is a 56 year old female with a past medical history of metastatic cancer of uncertain primary and pulmonary nodules status post CT-guided biopsy on 07/03/2011 who presented to the hospital on 07/04/2011 with complaints of uncontrolled back pain. Upon initial evaluation, she was noted to be hypoxic and tachycardic and therefore was referred for admission. Her back pain was felt to be due to metastatic adenocarcinoma of undetermined primary origin.  Dr. Myna Hidalgo has been working up her malignancy. She also has been seen by the radiation oncologist for palliative radiotherapy to her T8 and L4/right sacral areas.  Interim History: Stable overnight.  Underwent radiation simulation 07/05/11.  For T8, L1 and L4 ablation/vertebroplasty procedure per IR today.  Assessment/Plan: Principal Problem:  *Metastasis to bone, uncertain primary and cancer associated back pain from pathologic lumbar vertebra fracture  The patient was admitted and put on aggressive pain control measures including a fentanyl patch, oral oxycodone and IV Dilaudid-HP for breakthrough pain and high-dose decadron for anti-inflammatory effects.  She underwent radiation simulation on 07/05/2011 in anticipation of palliative radiotherapy to T8/L4/right sacral areas.  For T8, L1, and L4 ablation/vertebroplasty today.  Pathology from her CT-guided lung biopsy on 07/03/2011 shows adenocarcinoma with special staining pending, but preliminary reports suggest a primary lung cancer.  Dr. Myna Hidalgo following for further recommendations regarding cancer treatment. Active Problems:  Constipation  Started daily MiraLAX 07/05/11.  Pleural effusion  Noted incidentally on CT scanning of the chest with bilateral effusions.  Respiratory status stable.  Anemia of chronic disease  Hemoglobin stable.  Abnormal  LFTs  Secondary to liver metastasis.   Code Status: Full Family Communication: Husband updated at bedside. Disposition Plan: Home, when stable.  Medical Consultants:  Dr. Arlan Organ, Oncology  Dr. Madaline Savage, Radiation Oncology  Dr. Julieanne Cotton, IR  Other consultants:  None.  Antibiotics:  None   Subjective  [name] feels well.  Reports adequate pain control with no nausea or vomiting. Her bowels still have not moved.   Objective   Objective: Filed Vitals:   07/05/11 1814 07/05/11 2247 07/06/11 0230 07/06/11 0705  BP: 134/79 143/82 134/81 154/82  Pulse: 102 95 91 108  Temp: 98.7 F (37.1 C) 98.6 F (37 C) 98.3 F (36.8 C) 98.1 F (36.7 C)  TempSrc: Oral Oral Oral Oral  Resp: 22 20 20 20   Height:      Weight:    80.2 kg (176 lb 12.9 oz)  SpO2: 94% 91% 93% 93%    Intake/Output Summary (Last 24 hours) at 07/06/11 0858 Last data filed at 07/06/11 3244  Gross per 24 hour  Intake 1645.32 ml  Output   2300 ml  Net -654.68 ml    Exam: Gen:  NAD Cardiovascular:  RRR, No M/R/G Respiratory: Lungs CTAB Gastrointestinal: Abdomen soft, NT/ND with normal active bowel sounds. Extremities: No C/E/C  Data Reviewed: Basic Metabolic Panel:  Lab 07/05/11 0102 07/04/11 0807 07/03/11 2354  NA 134* -- 137  K 4.2 -- 4.0  CL 98 -- 97  CO2 25 -- 26  GLUCOSE 151* -- 114*  BUN 9 -- 9  CREATININE 0.48* 0.47* 0.50  CALCIUM 9.0 -- 9.5  MG -- -- --  PHOS -- -- --   GFR Estimated Creatinine Clearance: 76.1 ml/min (by C-G formula based on Cr of 0.48).  Coagulation profile  Lab 07/03/11 0746  INR 1.02  PROTIME --  CBC:  Lab 07/05/11 0523 07/04/11 0807 07/03/11 2354 07/03/11 0746  WBC 9.7 8.9 12.2* 10.4  NEUTROABS -- -- -- --  HGB 11.5* 10.9* 11.7* 11.8*  HCT 35.2* 33.3* 34.6* 35.9*  MCV 83.8 84.3 83.6 83.9  PLT 511* 458* 500* 496*    Procedures and Diagnostic Studies:  Dg Chest 1 View 07/03/2011 IMPRESSION: Less than 5% left apical  pneumothorax.  The patient is asymptomatic on room air with pulse ox and 100%.  Original Report Authenticated By: Donavan Burnet, M.D.    Dg Chest 2 View 07/04/2011 IMPRESSION:  1.  No significant change in tiny 1-2% left apical pneumothorax. 2.  Stable bilateral pulmonary metastases. 3.  Mild bibasilar atelectasis and tiny bilateral pleural effusions.  Original Report Authenticated By: Danae Orleans, M.D.    Ct Angio Chest W/cm &/or Wo Cm 07/04/2011 IMPRESSION:  1.  No evidence of pulmonary embolus, though the lung bases are not imaged on this study.  2.  Small right pleural effusion appears mildly increased in size, though the recent biopsy was on the left side.  Small left pleural effusion appears grossly stable.  New partial consolidation of both lower lung lobes likely reflects atelectasis. 3.  Known dominant mass at the left lung base is only partially characterized on this study; innumerable nodules of varying sizes again noted throughout both lungs, compatible with metastatic disease. 4.  Mediastinal lymphadenopathy and supraclavicular lymphadenopathy again seen; 3.3 cm mass again suspected within the left breast. 5.  Metastatic disease noted to vertebral body T8; expansile lytic lesion within the left posterior 5th rib.  Original Report Authenticated By: Tonia Ghent, M.D.    Mr Cervical Spine W Wo Contrast 07/05/2011 IMPRESSION: Metastatic lesion involving the C7 vertebra with slight extension of the spinal canal without neural impingement.  No pathologic fracture.  Original Report Authenticated By: Gwynn Burly, M.D.    Mr Thoracic Spine W Wo Contrast 07/05/2011 IMPRESSION:  1. Extensive dural metastases in the thoracic spine compressing the thecal sac slightly. 2.  Slight pathologic fractures of the superior endplate of T8 and the inferior endplate of L1.  Original Report Authenticated By: Gwynn Burly, M.D.    Mr Lumbar Spine W Wo Contrast 07/05/2011 IMPRESSION:  1.  Metastatic  lesions and L1 and L4 with a slight pathologic fracture of the inferior aspect of L1 without neural impingement. Slight protrusion of the posterior margin of L1 into the spinal canal. 2.  Metastatic disease at L4 extends into the superior aspect of the left neural foramen affecting the left L4 nerve.  This should cause a left L4 radiculopathy. 3.  Destructive lesion in the right side of S1 and in the adjacent ilium.  No focal neural impingement. 4.  Dural metastases in the lower thoracic and upper lumbar spine.  Original Report Authenticated By: Gwynn Burly, M.D.    Nm Bone Scan Whole Body 06/22/2011 IMPRESSION: Bony metastatic disease.  There is marked abnormal uptake in the skeleton and corresponding to the suspicious bony lesions seen on CT.  The nuclear medicine bone scan also shows a suspicious area within a right rib, not definitely seen on CT.  Original Report Authenticated By: Britta Mccreedy, M.D.    Scheduled Meds:    . dexamethasone (DECADRON) IVPB  40 mg Intravenous Q24H  . docusate sodium  100 mg Oral BID  . enoxaparin  40 mg Subcutaneous Q24H  . fentaNYL  25 mcg Transdermal Q72H  . medroxyPROGESTERone  2.5 mg Oral Daily  .  polyethylene glycol  17 g Oral Daily  . DISCONTD: enoxaparin  40 mg Subcutaneous Q24H   Continuous Infusions:     . 0.9 % NaCl with KCl 20 mEq / L 50 mL/hr at 07/06/11 7829      LOS: 3 days   Hillery Aldo, MD Pager [phone]  07/06/2011, 8:58 AM

## 2011-07-06 NOTE — Anesthesia Postprocedure Evaluation (Signed)
  Anesthesia Post-op Note  Patient: [name]  Procedure(s) Performed: Procedure(s) (LRB): RADIOLOGY WITH ANESTHESIA (N/A)  Patient Location: PACU  Anesthesia Type: General  Level of Consciousness: awake  Airway and Oxygen Therapy: Patient Spontanous Breathing and Patient connected to nasal cannula oxygen  Post-op Pain: moderate  Post-op Assessment: Post-op Vital signs reviewed, Patient's Cardiovascular Status Stable, Respiratory Function Stable, Patent Airway and No signs of Nausea or vomiting  Post-op Vital Signs: Reviewed and stable  Complications: No apparent anesthesia complications

## 2011-07-06 NOTE — Progress Notes (Signed)
[facility] at bedside. Report given.  They will transport pt back to [facility].

## 2011-07-06 NOTE — Transfer of Care (Signed)
Immediate Anesthesia Transfer of Care Note  Patient: [name]  Procedure(s) Performed: Procedure(s) (LRB): RADIOLOGY WITH ANESTHESIA (N/A)  Patient Location: PACU  Anesthesia Type: General  Level of Consciousness: awake, alert  and oriented  Airway & Oxygen Therapy: Patient Spontanous Breathing and Patient connected to nasal cannula oxygen  Post-op Assessment: Report given to PACU RN, Post -op Vital signs reviewed and stable and Patient moving all extremities  Post vital signs: Reviewed and stable  Complications: No apparent anesthesia complications

## 2011-07-06 NOTE — Progress Notes (Signed)
IV attempted X2 with out success to try and get access for radiology procedure.  Will hook up to current  anticubital IV and change if needed when sedated.

## 2011-07-07 ENCOUNTER — Ambulatory Visit (HOSPITAL_COMMUNITY): Payer: BC Managed Care – PPO

## 2011-07-07 ENCOUNTER — Ambulatory Visit
Admission: RE | Admit: 2011-07-07 | Discharge: 2011-07-07 | Disposition: A | Discharge status: Home / Self Care | Payer: BC Managed Care – PPO | Source: Ambulatory Visit | Attending: Radiation Oncology | Admitting: Radiation Oncology

## 2011-07-07 ENCOUNTER — Other Ambulatory Visit (HOSPITAL_COMMUNITY): Payer: BC Managed Care – PPO

## 2011-07-07 ENCOUNTER — Inpatient Hospital Stay (HOSPITAL_COMMUNITY): Payer: BC Managed Care – PPO

## 2011-07-07 DIAGNOSIS — R112 Nausea with vomiting, unspecified: Secondary | ICD-10-CM

## 2011-07-07 DIAGNOSIS — K5900 Constipation, unspecified: Secondary | ICD-10-CM

## 2011-07-07 DIAGNOSIS — C3490 Malignant neoplasm of unspecified part of unspecified bronchus or lung: Secondary | ICD-10-CM

## 2011-07-07 DIAGNOSIS — C7952 Secondary malignant neoplasm of bone marrow: Secondary | ICD-10-CM

## 2011-07-07 DIAGNOSIS — C7951 Secondary malignant neoplasm of bone: Secondary | ICD-10-CM

## 2011-07-07 MED ORDER — PANTOPRAZOLE SODIUM 40 MG PO TBEC
40.0000 mg | DELAYED_RELEASE_TABLET | Freq: Every day | ORAL | Status: DC
  Administered 2011-07-07: 40 mg via ORAL
  Filled 2011-07-07: qty 1

## 2011-07-07 MED ORDER — FENTANYL 25 MCG/HR TD PT72: 1.0000 | MEDICATED_PATCH | TRANSDERMAL | Status: DC

## 2011-07-07 MED ORDER — DEXAMETHASONE 4 MG PO TABS
6.0000 mg | ORAL_TABLET | Freq: Three times a day (TID) | ORAL | Status: DC
  Administered 2011-07-07 (×3): 6 mg via ORAL
  Filled 2011-07-07 (×4): qty 1.5

## 2011-07-07 MED ORDER — DEXAMETHASONE 4 MG PO TABS
6.0000 mg | ORAL_TABLET | Freq: Three times a day (TID) | ORAL | Status: DC
  Filled 2011-07-07 (×4): qty 1

## 2011-07-07 MED ORDER — GADOBENATE DIMEGLUMINE 529 MG/ML IV SOLN
16.0000 mL | Freq: Once | INTRAVENOUS | Status: AC | PRN
  Administered 2011-07-07: 16 mL via INTRAVENOUS

## 2011-07-07 MED ORDER — DEXAMETHASONE 6 MG PO TABS: 6.0000 mg | ORAL_TABLET | Freq: Three times a day (TID) | ORAL | Status: DC

## 2011-07-07 MED FILL — Fentanyl Citrate Inj 0.05 MG/ML: INTRAMUSCULAR | Qty: 2 | Status: AC

## 2011-07-07 MED FILL — Tobramycin Sulfate For Inj 1.2 GM: INTRAMUSCULAR | Qty: 1 | Status: AC

## 2011-07-07 NOTE — Progress Notes (Signed)
[facility] Radiation Oncology Dept Therapy Treatment Record Phone [phone]   Radiation Therapy was administered to [name] on: 07/07/2011  12:30 PM and was treatment # 2 out of a planned course of 10 treatments.

## 2011-07-07 NOTE — Discharge Summary (Signed)
Physician Discharge Summary  Patient ID: [name] MRN: [id number] DOB/AGE: [birth year] 56 y.o.  Admit date: 07/03/2011 Discharge date: 07/07/2011  Primary Care Physician:  [name], MD   Discharge Diagnoses:    .Pleural effusion .Lung cancer with bone metastasis, pathologic lumbar vertebral fractures status post vertebroplasty .Cancer associated pain .Anemia of chronic disease .Abnormal LFTs .Constipation  Discharge Medications:  Medication List  As of 07/07/2011 10:00 AM   TAKE these medications         ALPRAZolam 0.25 MG tablet   Commonly known as: XANAX   Take 0.25-0.5 mg by mouth every 8 (eight) hours as needed. For anxiety.      BENADRYL 25 mg capsule   Generic drug: diphenhydrAMINE   Take 25 mg by mouth at bedtime as needed. For sleep.      bismuth subsalicylate 262 MG chewable tablet   Commonly known as: PEPTO BISMOL   Chew 524 mg by mouth daily as needed. For indigestion.      cyclobenzaprine 10 MG tablet   Commonly known as: FLEXERIL   Take 1 tablet (10 mg total) by mouth 3 (three) times daily as needed for muscle spasms.      dexamethasone 6 MG tablet   Commonly known as: DECADRON   Take 1 tablet (6 mg total) by mouth 3 (three) times daily with meals.      estradiol 0.0375 MG/24HR   Commonly known as: VIVELLE-DOT   Place 1 patch onto the skin 2 (two) times a week. Apply on Tuesdays and on Saturdays.      fentaNYL 25 MCG/HR   Commonly known as: DURAGESIC - dosed mcg/hr   Place 1 patch (25 mcg total) onto the skin every 3 (three) days.      HYDROmorphone 2 MG tablet   Commonly known as: DILAUDID   Take 1 tablet (2 mg total) by mouth every 4 (four) hours as needed for pain.      ibuprofen 200 MG tablet   Commonly known as: ADVIL,MOTRIN   Take 200 mg by mouth every 6 (six) hours as needed. For headache.      medroxyPROGESTERone 5 MG tablet   Commonly known as: PROVERA   Take 2.5 mg by mouth daily.      promethazine 12.5 MG tablet   Commonly known as: PHENERGAN   Take 1-2, if needed, every 6 hours for nausea             Disposition and Follow-up: The patient is being discharged home. She is encouraged to followup with her primary care doctor as needed, with her oncologist next week, and with Dr. Corliss Skains in 2 weeks.   Medical Consultants:  Dr. Arlan Organ, Oncology  Dr. Madaline Savage, Radiation Oncology  Dr. Julieanne Cotton, IR Other consultants:  None.  Procedures and Diagnostic Studies:  Dg Chest 1 View 07/03/2011 IMPRESSION: Less than 5% left apical pneumothorax. The patient is asymptomatic on room air with pulse ox and 100%. Original Report Authenticated By: Donavan Burnet, M.D.  Dg Chest 2 View 07/04/2011 IMPRESSION: 1. No significant change in tiny 1-2% left apical pneumothorax. 2. Stable bilateral pulmonary metastases. 3. Mild bibasilar atelectasis and tiny bilateral pleural effusions. Original Report Authenticated By: Danae Orleans, M.D.  Ct Angio Chest W/cm &/or Wo Cm 07/04/2011 IMPRESSION: 1. No evidence of pulmonary embolus, though the lung bases are not imaged on this study. 2. Small right pleural effusion appears mildly increased in size, though the recent biopsy was on the left  side. Small left pleural effusion appears grossly stable. New partial consolidation of both lower lung lobes likely reflects atelectasis. 3. Known dominant mass at the left lung base is only partially characterized on this study; innumerable nodules of varying sizes again noted throughout both lungs, compatible with metastatic disease. 4. Mediastinal lymphadenopathy and supraclavicular lymphadenopathy again seen; 3.3 cm mass again suspected within the left breast. 5. Metastatic disease noted to vertebral body T8; expansile lytic lesion within the left posterior 5th rib. Original Report Authenticated By: Tonia Ghent, M.D.  Mr Cervical Spine W Wo Contrast 07/05/2011 IMPRESSION: Metastatic lesion involving the C7 vertebra with slight  extension of the spinal canal without neural impingement. No pathologic fracture. Original Report Authenticated By: Gwynn Burly, M.D.  Mr Thoracic Spine W Wo Contrast 07/05/2011 IMPRESSION: 1. Extensive dural metastases in the thoracic spine compressing the thecal sac slightly. 2. Slight pathologic fractures of the superior endplate of T8 and the inferior endplate of L1. Original Report Authenticated By: Gwynn Burly, M.D.  Mr Lumbar Spine W Wo Contrast 07/05/2011 IMPRESSION: 1. Metastatic lesions and L1 and L4 with a slight pathologic fracture of the inferior aspect of L1 without neural impingement. Slight protrusion of the posterior margin of L1 into the spinal canal. 2. Metastatic disease at L4 extends into the superior aspect of the left neural foramen affecting the left L4 nerve. This should cause a left L4 radiculopathy. 3. Destructive lesion in the right side of S1 and in the adjacent ilium. No focal neural impingement. 4. Dural metastases in the lower thoracic and upper lumbar spine. Original Report Authenticated By: Gwynn Burly, M.D.  Nm Bone Scan Whole Body 06/22/2011 IMPRESSION: Bony metastatic disease. There is marked abnormal uptake in the skeleton and corresponding to the suspicious bony lesions seen on CT. The nuclear medicine bone scan also shows a suspicious area within a right rib, not definitely seen on CT. Original Report Authenticated By: Britta Mccreedy, M.D.    Discharge Laboratory Values: Basic Metabolic Panel:  Lab 07/05/11 1610 07/04/11 0807 07/03/11 2354  NA 134* -- 137  K 4.2 -- 4.0  CL 98 -- 97  CO2 25 -- 26  GLUCOSE 151* -- 114*  BUN 9 -- 9  CREATININE 0.48* 0.47* 0.50  CALCIUM 9.0 -- 9.5  MG -- -- --  PHOS -- -- --   GFR Estimated Creatinine Clearance: 76.1 ml/min (by C-G formula based on Cr of 0.48).  Coagulation profile  Lab 07/03/11 0746  INR 1.02  PROTIME --    CBC:  Lab 07/05/11 0523 07/04/11 0807 07/03/11 2354 07/03/11 0746  WBC 9.7 8.9  12.2* 10.4  NEUTROABS -- -- -- --  HGB 11.5* 10.9* 11.7* 11.8*  HCT 35.2* 33.3* 34.6* 35.9*  MCV 83.8 84.3 83.6 83.9  PLT 511* 458* 500* 496*   Microbiology Recent Results (from the past 240 hour(s))  SURGICAL PCR SCREEN     Status: Normal   Collection Time   07/06/11  8:12 AM      Component Value Range Status Comment   MRSA, PCR NEGATIVE  NEGATIVE Final    Staphylococcus aureus NEGATIVE  NEGATIVE Final      Brief H and P: For complete details please refer to admission H and P, but in brief, Ms. Diekmann is a 56 year old female with a past medical history of metastatic cancer of uncertain primary and pulmonary nodules status post CT-guided biopsy on 07/03/2011 who presented to the hospital on 07/04/2011 with complaints of uncontrolled back pain. Upon  initial evaluation, she was noted to be hypoxic and tachycardic and therefore was referred for admission. Her back pain was felt to be due to metastatic adenocarcinoma of undetermined primary origin. Dr. Myna Hidalgo has been working up her malignancy.   Physical Exam at Discharge: BP 132/85  Pulse 76  Temp 97.8 F (36.6 C) (Oral)  Resp 20  Ht 5\' 2"  (1.575 m)  Wt 80.2 kg (176 lb 12.9 oz)  BMI 32.34 kg/m2  SpO2 93% Gen:  NAD Cardiovascular:  RRR, No M/R/G Respiratory: Lungs CTAB Gastrointestinal: Abdomen soft, NT/ND with normal active bowel sounds. Extremities: No C/E/C  Hospital Course:  Principal Problem:  *Lung cancer with bone metastasis, pathologic lumbar vertebral fractures status post vertebroplasty  The patient was admitted and put on aggressive pain control measures including a fentanyl patch, oral oxycodone and IV Dilaudid-HP for breakthrough pain and high-dose decadron for anti-inflammatory effects. She will be discharged on a fentanyl patch, dilaudid PO for breakthrough pain and decadron 6 mg Q 8 hours.  She underwent radiation simulation on 07/05/2011 in anticipation of palliative radiotherapy to T8/L4/right sacral areas  with treatment initiated in the hospital.   Status post T8, L1, and L4 ablation/vertebroplasty 07/06/11.   Pathology from her CT-guided lung biopsy on 07/03/2011 showed adenocarcinoma with special staining pending, but preliminary reports suggest a primary lung cancer.   Dr. Myna Hidalgo following for further recommendations regarding cancer treatment, and will follow her post-discharge.  To receive a brain MRI prior to discharge to complete a staging work up. Active Problems:  Constipation   Started daily MiraLAX 07/05/11.  Encouraged to use laxatives/stool softeners PRN post discharge. Pleural effusion   Noted incidentally on CT scanning of the chest with bilateral effusions.   Respiratory status stable. Anemia of chronic disease   Hemoglobin stable. Abnormal LFTs   Secondary to liver metastasis.  Recommendations for hospital follow-up: 1. Follow up brain MRI.  Diet:  Regular.  Activity:  Ambulate with walker, increase activity slowly.  Condition at Discharge:   Improved.  Time spent on Discharge:  35 minutes.  Signed: Dr. Trula Ore Brynlyn Dade Pager [phone] 07/07/2011, 10:00 AM

## 2011-07-07 NOTE — Progress Notes (Signed)
Discharge instructions and prescriptions given.  Verbalized understanding.  Left via wheelchair with husband. Edison Nasuti

## 2011-07-07 NOTE — Progress Notes (Signed)
Mrs. Merfeld is doing okay this morning. She'll sore from her vertebral plasties. She had 3 areas I treated. Again, she is a little sore. She started her radiation treatments. She's doing okay with these.  Overall, her pain control is much better. I think the Duragesic patches are helping her. It does not sound like there is a lot of breakthrough pain at this point in time.  We will we'll get her on some oral Decadron. I think she'll benefit from this as an outpatient. This will also help with respect to her radiation treatments.  We are awaiting MRI of the brain. I suspect this is negative but given these multiple lung nodules, she is at risk for CNS involvement.  I'll we will await the genetic assays test. These will not be back until next week.  Her vital signs are all stable. Blood pressure 132/85. Her appetite is a little better this morning. She feels like eating .  Hopefully, she'll be able to be discharged either today or tomorrow. She feels like she will be able to go home. I feel that she'll be able to manage her pain at home. I believe that the vertebral plasties really have been a big benefit.  She will continue her radiation as an outpatient. Once we know the results of her genetic assays, then we will decide as to how to approach systemic therapy for her.  Pete E.  Deuteronomy 31:6-8

## 2011-07-07 NOTE — Discharge Instructions (Addendum)
May remove bandages on Saturday. Ok to then shower. Walk with walker. No driving or working until seen for follow up. Call if increased pain or new pain at a different level.Bone Metastases Cancerous growths can begin in any part of the body. The original site of cancer is called the primary tumor or primary cancer (for example, breast cancer). After cancer has developed in one area of the body, cancerous cells from that area can break away and travel through the body's bloodstream. If these cancerous cells begin growing in another place in the body, they are called metastases. Bone metastases are cancer cells that have spread to the bone (which is different from a cancer that starts in the bone). These secondary growths are like the original tumor. For example, if a prostate cancer spreads to bone it is called metastatic prostate cancer, or prostate cancer metastatic to bone, but not bone cancer. Cancers can spread to almost any bone; the spine and pelvis are often involved.  Any type of cancer can spread to the bone, but the most common are breast, lung, kidney, thyroid and prostate cancers. Sometimes the primary tumor is not discovered until there are bone problems. If the primary cancer location cannot be discovered, the cancer is called cancer of unknown primary location. SYMPTOMS  Pain in the bones is the main symptom of bone metastases. Some other problems may occur first including:  Decreased appetite.   Nausea.   Muscle weakness.   Confusion.   Unusual sleep patterns due to discomfort.   Overly tired (fatigue).   Restlessness.  Frail or brittle bones may lead to broken bones (fractures) that lead to learning what is wrong (diagnosis). A tumor often weakens the bones.  DIAGNOSIS  Metastatic cancers may be found months or years after or at the same time as the primary tumor. When a second tumor is found in a patient who has been treated for cancer, it is more often a metastasis than  another primary tumor.  The patient's symptoms, physical examination, X-rays and blood tests may suggest a bone metastases. In addition, an examination of tissue or a cell sample (biopsy) is usually done to find the cancer. This sample is removed with a needle. This tissue sample must be looked at under a microscope to confirm a diagnosis. TREATMENT  Options generally include treatments that give relief from symptoms (palliative) or curative. Those with advanced, metastasized cancer may receive treatment focused on pain relief and prolonging life. These treatments depend on the type of cancer and its location.  Treatment for cancer depends on its type and location. Some of these treatments are:  Surgery to remove the original tumor and/or to remove parts of the body that produce hormones and other chemicals that make cancer worse.   Treatment with drugs (chemotherapy).   Bone marrow transplantations on rare occasions.   Radiation therapy (radiotherapy).   Hormonal therapy.   Pain relieving medications.  Your caregiver will help you understand the likelihood that any particular treatment will be helpful for you. While some treatments aim to cure or control the cancer, others give relief from symptoms only. If you have bone metastases, radiation therapy may be recommended to treat pain (if it is in one main location). Pain medications are available. These include strong medicines like morphine. You may be instructed to take a long-acting pain medication (to control most of your pain) and a short-acting medication to control occasional flares of pain. Pain medication is sometimes also given continuously through  a pump. HOME CARE INSTRUCTIONS   Take medications exactly as prescribed.   Keep any follow-up appointments.   Pain medications can make you sleepy or confused. Do not drive, climb ladders, or do other dangerous activities while on pain medication.   Pain medications often cause  constipation. Ask your caregiver for information on stool softeners.   Do not share your pain medication with others.  SEEK MEDICAL CARE IF:   Your bone pain is not controlled.   You are having problems or side effects from your medication.   You have excessive sleepiness or confusion.  SEEK IMMEDIATE MEDICAL CARE IF:   You fall and have any injury or pain from the fall.   You have trouble walking.   You have numbness or tingling in your legs.   You develop a sudden significant worsening of your pain.  Document Released: 12/23/2001 Document Revised: 12/22/2010 Document Reviewed: 08/16/2007 [facility] Patient Information 2012 [place], [place].Lung Cancer Lung cancer is a tumor which starts as a growth in your lungs. Cancer is a group of many related diseases that begin in cells, the building blocks of the body. Normally, cells grow and divide to produce more cells only when the body needs them. Sometimes cells keep dividing when new cells are not needed. These extra cells may form a mass of tissue called a growth or tumor. Tumors can be either benign (not cancerous) or malignant (cancerous). Cancer can begin in any organ or tissue of the body. The original tumor (where the tumor started out) is called the primary cancer and is usually named for where it begins.  Lung cancer is the most common cause of cancer death in men and women. There are several different types of lung cancers. Usually, lung cancer is described as either small-cell lung cancer or non-small-cell lung cancer. Other types of cancer occur in the lungs, including carcinoid and cancers spread from other organs. The types of cancer have different behavior and treatment. CAUSES  This cancer usually starts when the lungs are exposed to harmful chemicals. When you quit smoking, your risk of lung cancer falls each year (but is never the same as a person who has never smoked).  Other risks include:   Radon gas exposure.   Asbestos  and other industrial substance exposure.   Second hand tobacco smoke.   Air pollution.   Family or personal history of lung cancer.   Age over 27.  SYMPTOMS  Lung cancer can cause many symptoms. They depend on the type of cancer, its location and other factors. Symptoms of lung cancer can include:  Cough (either new, different or more severe).   Shortness of breath.   Coughing up blood (hemoptysis).   Chest pain.   Hoarseness.   Swelling of the face.   Drooping eyelid.   Changes in blood tests: low sodium (hyponatremia), high calcium (hypercalcemia) or low blood count (anemia).   Weight loss.  In its early stages, lung cancer may not have symptoms and can be discovered by accident. Many of the symptoms above can be caused by diseases other than lung cancer. DIAGNOSIS  In early lung cancer, the patient often does not notice problems. It usually has spread by the time problems are first noticed. Your caregiver may suspect lung cancer based on your symptoms, your exam or based on tests (such as x-rays) obtained for other reasons. Common tests that help your caregiver diagnose your condition include:  Chest x-ray.   CT scan of the lungs  and chest.   Blood tests.  If a tumor is found, a biopsy will be necessary to confirm that cancer is present and to determine the type of cancer. TREATMENT   Surgery offers a hope for a cure if the cancer has not spread and the cancer is not a small cell (oat cell) cancer of the lung. Surgery cannot cure the small cell type of cancer.   Radiation Therapy is a form of high energy X-ray that helps slow or kill the cancer. It is often used along with medications (chemotherapy) to help treat the cancer and control pain.   Chemotherapy is used in combination with surgery in advanced cancer. It is also used in all small cell cancers.   Many new treatments look promising.   Your caregiver can give you more information and discuss treatment  options that are best for your type of cancer.  HOME CARE INSTRUCTIONS   If you smoke, stop!   Take all medications as told.   Keep all appointments with your caregiver and other specialists.   Ask your caregiver if you should see a cancer specialist, if that has not been arranged.   If you require oxygen or breathing equipment, be sure you know how to use it and who to call with questions.   Follow any special diet directions. If you have problems with appetite, ask your caregiver for help.  SEEK MEDICAL CARE IF:   You have had a surgical procedure are you are having trouble recovering.   You have ongoing weight loss.   You have decreased strength or energy past the point when your caregiver said you would feel better.   You develop nausea or lightheadedness.   You have pain that is not improving.  SEEK IMMEDIATE MEDICAL CARE IF:   You cough up clotted blood or bright red blood.   Your pain is uncontrolled.   You develop new difficulty breathing or chest pain.   You develop swelling in one or both ankles or legs, or swelling in your face or neck.   You develop new headache or confusion.  Document Released: 04/10/2000 Document Revised: 12/22/2010 Document Reviewed: 01/20/2008 [facility] Patient Information 2012 [place], [place].

## 2011-07-07 NOTE — Progress Notes (Signed)
Subjective: Pt looks and feels good this am. Still has a little bit of pain in low back at L4 level, but better than pre-procedure. Essentially no pain at T8 or L1 levels.  She denies any pain, paresthesias, numbness, tingling in extremities  Objective: Physical Exam: BP 132/85  Pulse 76  Temp 97.8 F (36.6 C) (Oral)  Resp 20  Ht 5\' 2"  (1.575 m)  Wt 176 lb 12.9 oz (80.2 kg)  BMI 32.34 kg/m2  SpO2 93% Back: All sites clean, dressings dry. Minimal tenderness at L4 level, no hematoma. Ext: FROM, normal 5/5 strength symmetrically. Normal sensory motor function    Labs: CBC  Basename 07/05/11 0523  WBC 9.7  HGB 11.5*  HCT 35.2*  PLT 511*   BMET  Basename 07/05/11 0523  NA 134*  K 4.2  CL 98  CO2 25  GLUCOSE 151*  BUN 9  CREATININE 0.48*  CALCIUM 9.0   LFT No results found for this basename: PROT,ALBUMIN,AST,ALT,ALKPHOS,BILITOT,BILIDIR,IBILI,LIPASE in the last 72 hours PT/INR No results found for this basename: LABPROT:2,INR:2 in the last 72 hours   Studies/Results: No results found.  Assessment/Plan: S/p Ablation and VP of T8, L1, L4 pathologic compression fx. Doing well. Reviewed all activity restrictions. Walking with walker for 2 weeks. No driving or working until seen for follow up. Office will call pt for follow up time/date. Ok to remove bandages and shower tomorrow.  D/W Dr. Corliss Skains.  LOS: 4 days    Brayton El PA-C 07/07/2011 9:21 AM

## 2011-07-10 ENCOUNTER — Telehealth (HOSPITAL_COMMUNITY): Payer: Self-pay

## 2011-07-10 ENCOUNTER — Encounter: Payer: Self-pay | Admitting: Radiation Oncology

## 2011-07-10 ENCOUNTER — Ambulatory Visit
Admission: RE | Admit: 2011-07-10 | Discharge: 2011-07-10 | Disposition: A | Discharge status: Home / Self Care | Payer: BC Managed Care – PPO | Source: Ambulatory Visit | Attending: Radiation Oncology | Admitting: Radiation Oncology

## 2011-07-10 ENCOUNTER — Other Ambulatory Visit (HOSPITAL_COMMUNITY): Payer: Self-pay | Admitting: Interventional Radiology

## 2011-07-10 VITALS — BP 127/79 | HR 95 | Temp 98.2°F | Resp 20 | Ht 62.0 in | Wt 175.6 lb

## 2011-07-10 DIAGNOSIS — Z09 Encounter for follow-up examination after completed treatment for conditions other than malignant neoplasm: Secondary | ICD-10-CM

## 2011-07-10 DIAGNOSIS — IMO0002 Reserved for concepts with insufficient information to code with codable children: Secondary | ICD-10-CM

## 2011-07-10 DIAGNOSIS — C3490 Malignant neoplasm of unspecified part of unspecified bronchus or lung: Secondary | ICD-10-CM

## 2011-07-10 DIAGNOSIS — C7951 Secondary malignant neoplasm of bone: Secondary | ICD-10-CM

## 2011-07-10 NOTE — Progress Notes (Signed)
   Weekly Management Note: Thoracic and lumbosacral spine Current Dose:  900 cGy  Projected Dose: 3000 cGy   Narrative:  The patient presents for routine under treatment assessment.  CBCT/MVCT images/Port film x-rays were reviewed.  The chart was checked. She is doing well. The pain in her thoracic spine responded very well to vertebroplasty. Staging scan of the brain revealed a 10 mm lesion in the right thalamus, suspicious for metastasis. It is a single lesion. However the lesion does not enhance. It is atypical without significant edema. Most suspicious for metastasis.  4/10 pain in her right hip/ the low back. Taking Duragesic and Dilaudid.  Physical Findings:  height is 5\' 2"  (1.575 m) and weight is 175 lb 9.6 oz (79.652 kg). Her oral temperature is 98.2 F (36.8 C). Her blood pressure is 127/79 and her pulse is 95. Her respiration is 20.  sitting comfortably in a chair. Ambulating with a walker   Impression:  The patient is tolerating radiotherapy.  Plan:  Continue radiotherapy as planned. I reviewed the results of her MRI and showed her the images. We spoke with briefly about stereotactic radiosurgery v. whole brain radiotherapy. I will talk to our [facility] coordinator and get the patient scheduled for consultation to address her putative brain metastasis and 3 T MRI. Also-The patient will be discussed at  brain tumor conference. This will be especially useful as it has a somewhat atypical appearance.  ________________________________   Lonie Peak, M.D.

## 2011-07-10 NOTE — Telephone Encounter (Signed)
fI spoke with [name].  She stated that she is feeling very well.  She is still having some hip pain but, she stated that she feels like that is from the other.  I gave her the  f/u apt time

## 2011-07-10 NOTE — Progress Notes (Signed)
Pt states her appetite is improving, her fatigue level as well. She has pain 4/10 in right hip; taking Dilaudid prn w/75% relief. Has Duragesic patch 25 mcg. Occass has pain in lower back.

## 2011-07-11 ENCOUNTER — Other Ambulatory Visit: Payer: Self-pay | Admitting: Radiation Therapy

## 2011-07-11 ENCOUNTER — Ambulatory Visit
Admission: RE | Admit: 2011-07-11 | Discharge: 2011-07-11 | Disposition: A | Discharge status: Home / Self Care | Payer: BC Managed Care – PPO | Source: Ambulatory Visit | Attending: Radiation Oncology | Admitting: Radiation Oncology

## 2011-07-11 DIAGNOSIS — C7931 Secondary malignant neoplasm of brain: Secondary | ICD-10-CM

## 2011-07-12 ENCOUNTER — Ambulatory Visit
Admission: RE | Admit: 2011-07-12 | Discharge: 2011-07-12 | Disposition: A | Discharge status: Home / Self Care | Payer: BC Managed Care – PPO | Source: Ambulatory Visit | Attending: Radiation Oncology | Admitting: Radiation Oncology

## 2011-07-13 ENCOUNTER — Ambulatory Visit
Admission: RE | Admit: 2011-07-13 | Discharge: 2011-07-13 | Disposition: A | Discharge status: Home / Self Care | Payer: BC Managed Care – PPO | Source: Ambulatory Visit | Attending: Radiation Oncology | Admitting: Radiation Oncology

## 2011-07-13 DIAGNOSIS — C7931 Secondary malignant neoplasm of brain: Secondary | ICD-10-CM

## 2011-07-13 MED ORDER — GADOBENATE DIMEGLUMINE 529 MG/ML IV SOLN
16.0000 mL | Freq: Once | INTRAVENOUS | Status: AC | PRN
  Administered 2011-07-13: 16 mL via INTRAVENOUS

## 2011-07-14 ENCOUNTER — Ambulatory Visit
Admission: RE | Admit: 2011-07-14 | Discharge: 2011-07-14 | Disposition: A | Discharge status: Home / Self Care | Payer: BC Managed Care – PPO | Source: Ambulatory Visit | Attending: Radiation Oncology | Admitting: Radiation Oncology

## 2011-07-17 ENCOUNTER — Ambulatory Visit
Admission: RE | Admit: 2011-07-17 | Discharge: 2011-07-17 | Disposition: A | Discharge status: Home / Self Care | Payer: BC Managed Care – PPO | Source: Ambulatory Visit | Attending: Radiation Oncology | Admitting: Radiation Oncology

## 2011-07-17 ENCOUNTER — Ambulatory Visit
Admission: RE | Admit: 2011-07-17 | Discharge: 2011-07-17 | Disposition: A | Discharge status: Home / Self Care | Payer: BC Managed Care – PPO | Source: Ambulatory Visit | Attending: Neurosurgery | Admitting: Neurosurgery

## 2011-07-17 ENCOUNTER — Encounter: Payer: Self-pay | Admitting: Radiation Oncology

## 2011-07-17 ENCOUNTER — Ambulatory Visit: Payer: BC Managed Care – PPO

## 2011-07-17 ENCOUNTER — Ambulatory Visit (HOSPITAL_BASED_OUTPATIENT_CLINIC_OR_DEPARTMENT_OTHER): Payer: BC Managed Care – PPO | Admitting: Hematology & Oncology

## 2011-07-17 ENCOUNTER — Other Ambulatory Visit: Payer: Self-pay | Admitting: *Deleted

## 2011-07-17 ENCOUNTER — Other Ambulatory Visit: Payer: BC Managed Care – PPO | Admitting: Lab

## 2011-07-17 VITALS — BP 127/79 | HR 89 | Temp 97.5°F | Ht 62.0 in | Wt 168.0 lb

## 2011-07-17 VITALS — BP 122/79 | HR 96 | Temp 97.4°F | Resp 20 | Wt 169.4 lb

## 2011-07-17 DIAGNOSIS — C3490 Malignant neoplasm of unspecified part of unspecified bronchus or lung: Secondary | ICD-10-CM

## 2011-07-17 DIAGNOSIS — C7931 Secondary malignant neoplasm of brain: Secondary | ICD-10-CM

## 2011-07-17 DIAGNOSIS — C7949 Secondary malignant neoplasm of other parts of nervous system: Secondary | ICD-10-CM

## 2011-07-17 DIAGNOSIS — C3430 Malignant neoplasm of lower lobe, unspecified bronchus or lung: Secondary | ICD-10-CM

## 2011-07-17 DIAGNOSIS — C7952 Secondary malignant neoplasm of bone marrow: Secondary | ICD-10-CM

## 2011-07-17 LAB — CBC WITH DIFFERENTIAL (CANCER CENTER ONLY)
BASO#: 0 10*3/uL (ref 0.0–0.2)
Eosinophils Absolute: 0.1 10*3/uL (ref 0.0–0.5)
HGB: 13.8 g/dL (ref 11.6–15.9)
LYMPH#: 0.3 10*3/uL — ABNORMAL LOW (ref 0.9–3.3)
MCH: 28.3 pg (ref 26.0–34.0)
MONO#: 1 10*3/uL — ABNORMAL HIGH (ref 0.1–0.9)
MONO%: 6.2 % (ref 0.0–13.0)
NEUT#: 14.1 10*3/uL — ABNORMAL HIGH (ref 1.5–6.5)
RBC: 4.87 10*6/uL (ref 3.70–5.32)
WBC: 15.4 10*3/uL — ABNORMAL HIGH (ref 3.9–10.0)

## 2011-07-17 LAB — COMPREHENSIVE METABOLIC PANEL
Albumin: 3.8 g/dL (ref 3.5–5.2)
BUN: 12 mg/dL (ref 6–23)
CO2: 27 mEq/L (ref 19–32)
Calcium: 9.3 mg/dL (ref 8.4–10.5)
Chloride: 100 mEq/L (ref 96–112)
Glucose, Bld: 103 mg/dL — ABNORMAL HIGH (ref 70–99)
Potassium: 4.4 mEq/L (ref 3.5–5.3)
Total Protein: 6 g/dL (ref 6.0–8.3)

## 2011-07-17 MED ORDER — HYDROCORTISONE 0.5 % EX CREA: TOPICAL_CREAM | CUTANEOUS | Status: DC

## 2011-07-17 MED ORDER — ERLOTINIB HCL 150 MG PO TABS: 150.0000 mg | ORAL_TABLET | Freq: Every day | ORAL | Status: AC

## 2011-07-17 MED ORDER — SUCRALFATE 1 GM/10ML PO SUSP: 1.0000 g | Freq: Four times a day (QID) | ORAL | Status: AC

## 2011-07-17 MED ORDER — DOXYCYCLINE HYCLATE 100 MG PO TABS: 100.0000 mg | ORAL_TABLET | Freq: Two times a day (BID) | ORAL | Status: DC

## 2011-07-17 NOTE — Progress Notes (Signed)
Weekly Management Note:  Site:T spine/L-S spine Current Dose:  2400  cGy Projected Dose: 3000  cGy  Narrative: The patient is seen today for routine under treatment assessment. CBCT/MVCT images/port films were reviewed. The chart was reviewed.   Her thoracic pain has him is completely resolved. Her thoracic spine discomfort improved within 1-2 days after her vertebroplasty. Her low back pain is also improved. She does report discomfort on swallowing, presuming from radiation esophagitis.She is noted to have a limited number of brain metastases and is seen by radiosurgery team directed by Dr. Basilio Cairo today.  Physical Examination: There were no vitals filed for this visit..  Weight:  . No significant skin changes. Intact neurologically.  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  Plan: Continue radiation therapy as planned. I'll go ahead and get her started on Carafate slurry to use when necessary. She does have hydrocodone to take when necessary.

## 2011-07-17 NOTE — Progress Notes (Signed)
This office note has been dictated.

## 2011-07-17 NOTE — Progress Notes (Signed)
Pt c/o fatigue limiting her daily activities and unrelieved by rest, esophagitis which is limiting her PO intake, has loss of appetite. Will refer her to dietician. Denies pain, headache, nausea, blurred vision, does have difficulty sleeping past few days. Taking Decadron 6 mg TID but states she "skips lunch dose due to not eating on many days." Mobility and pain in back, sacral area improved. Pt has not taken any PO pain meds in several days, does have Duragesic patch, 25 mcg.

## 2011-07-17 NOTE — Progress Notes (Signed)
  Radiation Oncology         [phone] ________________________________  Name: [name] MRN: [id number]  Date: 07/17/2011  DOB: Feb 23, 1955  SIMULATION AND TREATMENT PLANNING NOTE  DIAGNOSIS: adenocarcinoma of the lung, with four brain metastases  NARRATIVE:  The patient was brought to the CT Simulation planning suite.  Identity was confirmed.  All relevant records and images related to the planned course of therapy were reviewed.  The patient freely provided informed written consent to proceed with treatment after reviewing the details related to the planned course of therapy. The consent form was witnessed and verified by the simulation staff. Intravenous access was established for contrast administration. Then, the patient was set-up in a stable reproducible supine position for radiation therapy.  A relocatable thermoplastic stereotactic head frame was fabricated for precise immobilization.  CT images were obtained with IV contrast.  Surface markings were placed.  The CT images were loaded into the planning software and fused with the patient's targeting MRI scan.  Then the target and avoidance structures were contoured.  Treatment planning then occurred.  The radiation prescription was entered and confirmed.  I have requested 3D planning  I have requested a DVH of the following structures: Brain stem, brain, left eye, right eye, lenses, optic chiasm, target volumes, uninvolved brain, and normal tissue.    PLAN:  The patient will receive 20 Gy in 1 fraction to each of the 4 brain metastases in the right inferior frontal lobe, right thalamus, left posterior frontal lobe, and left medial occipital lobe   -----------------------------------  Lonie Peak, MD

## 2011-07-17 NOTE — Progress Notes (Signed)
Radiation Oncology         [phone] ________________________________  Initial outpatient Consultation  Name: [name] MRN: [id number]  Date: 07/17/2011  DOB: [birth year]   REFERRING PHYSICIAN: Josph Macho, MD  DIAGNOSIS: Brain metastases secondary to adenocarcinoma of lung primary.   HISTORY OF PRESENT ILLNESS::[name] is a 56 y.o. female who  is currently under the care of Dr. Chipper Herb in my department. She is currently receiving radiotherapy to her spine, treating bone metastases. For a full consultation note please see Dr. Rennie Plowman documentation. The patient underwent kyphoplasty before her radiotherapy and it significantly improved her pain. Since she was asymptomatic, but underwent an MRI of her brain for staging. This demonstrated a small metastasis in the right thalamus. The patient then underwent of 3 T MRI to verify if there are any other metastases in the brain. We reviewed her MRI today at brain tumor conference. It confirmed a 10mm lesion in the right thalamus as well as 3 other lesions suspicious for brain metastases. There is a is a 9 mm lesion in the right inferior frontal lobe, and a 6 mm lesion in left medial occipital lobe. Additionally, there is a 4 mm lesion in the posterior left frontal lobe.  Patient denies any seizures headaches nausea or visual changes. No focal neurologic deficits. No weakness or numbness in her extremities.  The patient also has diffuse metastatic disease in her skeleton and lungs. Biopsies upper been performed of her lung disease as well as her spinal skeletal disease. Findings are consistent with metastatic adenocarcinoma, lung primary. The patient reports that she'll be starting starting oral chemotherapy in the near future. Possibly Tarceva, but I cannot tell thus far with certainty from the documentation in the electronic medical record.  I would estimate her ECoG performance status to be 1. She is taking Decadron, 6  mg at breakfast and 6 mg at dinner. She does not take it at lunch.  PREVIOUS RADIATION THERAPY: Yes as above  PAST MEDICAL HISTORY:  has a past medical history of Pulmonary nodules (06/22/11); IBS (irritable bowel syndrome); Fibroadenoma of breast; and Metastasis of unknown origin.    PAST SURGICAL HISTORY: Past Surgical History  Procedure Date  . Lung biopsy     FAMILY HISTORY: family history includes Cancer in her mother.  SOCIAL HISTORY:  reports that she has never smoked. She has never used smokeless tobacco. She reports that she drinks alcohol. She reports that she does not use illicit drugs.  ALLERGIES: Review of patient's allergies indicates no known allergies.  MEDICATIONS:  Current Outpatient Prescriptions  Medication Sig Dispense Refill  . ALPRAZolam (XANAX) 0.25 MG tablet Take 0.25-0.5 mg by mouth every 8 (eight) hours as needed. For anxiety.      . bismuth subsalicylate (PEPTO BISMOL) 262 MG chewable tablet Chew 524 mg by mouth daily as needed. For indigestion.      Marland Kitchen dexamethasone (DECADRON) 6 MG tablet Take 6 mg by mouth 2 (two) times daily with a meal.      . diphenhydrAMINE (BENADRYL) 25 mg capsule Take 25 mg by mouth at bedtime as needed. For sleep.      Marland Kitchen doxycycline (VIBRA-TABS) 100 MG tablet Take 1 tablet (100 mg total) by mouth 2 (two) times daily.  60 tablet  6  . erlotinib (TARCEVA) 150 MG tablet Take 1 tablet (150 mg total) by mouth daily. Take on an empty stomach 1 hour before meals or 2 hours after.  30 tablet  2  . estradiol (VIVELLE-DOT) 0.0375 MG/24HR Place 1 patch onto the skin 2 (two) times a week. Apply on Tuesdays and on Saturdays.      . fentaNYL (DURAGESIC - DOSED MCG/HR) 25 MCG/HR Place 1 patch (25 mcg total) onto the skin every 3 (three) days.  5 patch  0  . hydrocortisone cream 0.5 % Like to skin at bedtime every night starting the day before Tarceva.  60 g  6  . ibuprofen (ADVIL,MOTRIN) 200 MG tablet Take 200 mg by mouth every 6 (six) hours as  needed. For headache.      . medroxyPROGESTERone (PROVERA) 5 MG tablet Take 2.5 mg by mouth daily.      . promethazine (PHENERGAN) 12.5 MG tablet Take 1-2, if needed, every 6 hours for nausea  60 tablet  3  . sucralfate (CARAFATE) 1 GM/10ML suspension Take 10 mLs (1 g total) by mouth 4 (four) times daily.  420 mL  1    REVIEW OF SYSTEMS:  As Above   PHYSICAL EXAM:  weight is 169 lb 6.4 oz (76.839 kg). Her oral temperature is 97.4 F (36.3 C). Her blood pressure is 122/79 and her pulse is 96. Her respiration is 20.   General: Alert and oriented, in no acute distress HEENT: Head is normocephalic. Pupils are equally round and reactive to light. Extraocular movements are intact. Oropharynx is clear. No thrush  Neck: Neck is supple Heart: Regular in rate and rhythm with no murmurs, rubs, or gallops. Chest: Clear to auscultation bilaterally, with no rhonchi, wheezes, or rales. Abdomen: Soft, nontender, nondistended, with no rigidity or guarding. Extremities: No cyanosis or edema. Lymphatics: No concerning lymphadenopathy. Skin: No concerning lesions. Musculoskeletal: symmetric strength and muscle tone throughout. Neurologic: Cranial nerves II through XII are grossly intact. No obvious focalities. Speech is fluent. Coordination is intact. Psychiatric: Judgment and insight are intact. Affect is appropriate.    LABORATORY DATA:  Lab Results  Component Value Date   WBC 15.4* 07/17/2011   HGB 13.8 07/17/2011   HCT 40.9 07/17/2011   MCV 84 07/17/2011   PLT 392 07/17/2011   Lab Results  Component Value Date   NA 134* 07/05/2011   K 4.2 07/05/2011   CL 98 07/05/2011   CO2 25 07/05/2011   Lab Results  Component Value Date   ALT 51* 06/28/2011   AST 41* 06/28/2011   ALKPHOS 422* 06/28/2011   BILITOT 0.3 06/28/2011     RADIOGRAPHY:  Mr Laqueta Jean Wo Contrast  07/17/2011  **ADDENDUM** CREATED: 07/17/2011 10:47:24  An additional 4 mm lesion is present in the posterior left frontal lobe on image 135 of series  11.  This was discussed at the brain tumor conference 07/17/2011.  **END ADDENDUM** SIGNED BY: Chauncey Fischer, M.D.   07/13/2011  *RADIOLOGY REPORT*  Clinical Data: Lung cancer.  MRI HEAD WITHOUT AND WITH CONTRAST  Technique:  Multiplanar, multiecho pulse sequences of the brain and surrounding structures were obtained according to standard protocol without and with intravenous contrast  Contrast: 16mL MULTIHANCE GADOBENATE DIMEGLUMINE 529 MG/ML IV SOLN  Comparison: MRI 07/07/2011  Findings: Stereotactic radiosurgery protocol performed with thin sections of the brain on the 3 Tesla MRI unit.  8 mm lesion right thalamus is unchanged.  This has increased signal on T1 and is difficult to see on T2.  This lesion does not show significant enhancement.  No surrounding edema.  This may represent metastatic disease, possibly with hemorrhage.  Subtle area of ill-defined ring-like enhancement measuring  9mm,  in the right inferior frontal lobe without surrounding edema.  This is likely related to metastatic disease.  Ill-defined enhancement in the left medial occipital lobe and the cortex measures approximately 5 mm.  This is not seen previously and may represent metastatic disease.  Linear enhancement in the right occipital cortex probably represents a normal artery.  Ventricle size is normal.  No acute infarct.  No significant chronic ischemia.  No areas of hemorrhage are present in the brain. No areas of restricted diffusion are present.  IMPRESSION: 10 mm lesion in the right thalamus is unchanged.  This does not enhance and may represent a hemorrhagic metastatic deposit.  Two additional lesions seen which show minimal enhancement.  There is a 9 mm lesion in the right inferior frontal lobe, and a 6 mm lesion in left medial occipital lobe. Original Report Authenticated By: Jamesetta Orleans. MATTERN, M.D.   Mr Laqueta Jean Wo Contrast  07/07/2011  *RADIOLOGY REPORT*  Clinical Data: Metastatic carcinoma  MRI HEAD WITHOUT  AND WITH CONTRAST  Technique:  Multiplanar, multiecho pulse sequences of the brain and surrounding structures were obtained according to standard protocol without and with intravenous contrast  Contrast: 16mL MULTIHANCE GADOBENATE DIMEGLUMINE 529 MG/ML IV SOLN  Comparison: None.  Findings: 10 mm round lesion in the right medial upper thalamus. This shows homogeneous increased signal on T1 and is very difficult to see on FLAIR and T2.  The lesion does not enhance.  Given the history, this is suspicious for metastatic disease.  Melanoma would be a consideration as well as carcinoma.  No other mass lesions are identified.  Postcontrast imaging reveals no enhancing lesions  in the brain.  Ventricle size is normal.  No acute infarct.  No significant chronic ischemia.  Mild chronic sinusitis.  Brainstem is normal. Vessels at the base the brain are patent.  IMPRESSION: 10 mm lesion right thalamus.  This has an unusual appearance and is bright on T1 and very difficult to see on T2.  The lesion does not enhance.  Given the history, this is most likely due to metastatic disease however the lesion is atypical without significant edema or enhancement.  There may be some associated hemorrhage or melanin causing T1 shortening.  Original Report Authenticated By: Camelia Phenes, M.D. patible with bony metastatic disease.  There are multiple areas of abnormal uptake in the spine include the right of midline within a vertebral body near the junction of the cervical and thoracic spine, the left aspect of an upper thoracic spine vertebral body, diffusely throughout the T8 vertebral body, the L2 vertebral body, and the L4 vertebral body.  A long segment of abnormal uptake within a left rib correlates with a large destructive lesion in the posterior left fourth rib is seen on concurrent CT.  Additionally, there is a smaller area of uptake in the right sixth rib.  No correlative lesion is seen on chest CT of the right sixth rib.  Multiple  areas of abnormal uptake are seen in the pelvis bilaterally including the right superior and inferior pubic ramus, the left superior pubic ramus, the left iliac bone, and the right sacroiliac region, involving both the iliac bone and sacrum. Abnormal uptake is seen in the left femoral head.  There is degenerative change of the right ankle and right first toe.  IMPRESSION: Bony metastatic disease.  There is marked abnormal uptake in the skeleton and corresponding to the suspicious bony lesions seen on CT.  The nuclear medicine bone  scan also shows a suspicious area within a right rib, not definitely seen on CT.  Original Report Authenticated By: Britta Mccreedy, M.D.    CBC    Component Value Date/Time   WBC 15.4* 07/17/2011 1429   WBC 9.7 07/05/2011 0523   RBC 4.20 07/05/2011 0523   HGB 13.8 07/17/2011 1429   HGB 11.5* 07/05/2011 0523   HCT 40.9 07/17/2011 1429   HCT 35.2* 07/05/2011 0523   PLT 392 07/17/2011 1429   PLT 511* 07/05/2011 0523   MCV 84 07/17/2011 1429   MCV 83.8 07/05/2011 0523   MCH 28.3 07/17/2011 1429   MCH 27.4 07/05/2011 0523   MCHC 33.7 07/17/2011 1429   MCHC 32.7 07/05/2011 0523   RDW 16.2* 07/17/2011 1429   RDW 14.0 07/05/2011 0523   LYMPHSABS 0.3* 07/17/2011 1429   EOSABS 0.1 07/17/2011 1429   BASOSABS 0.0 07/17/2011 1429    CMP     Component Value Date/Time   NA 134* 07/05/2011 0523   K 4.2 07/05/2011 0523   CL 98 07/05/2011 0523   CO2 25 07/05/2011 0523   GLUCOSE 151* 07/05/2011 0523   BUN 9 07/05/2011 0523   CREATININE 0.48* 07/05/2011 0523   CALCIUM 9.0 07/05/2011 0523   PROT 6.9 06/28/2011 1517   ALBUMIN 3.9 06/28/2011 1517   AST 41* 06/28/2011 1517   ALT 51* 06/28/2011 1517   ALKPHOS 422* 06/28/2011 1517   BILITOT 0.3 06/28/2011 1517   GFRNONAA >90 07/05/2011 0523   GFRAA >90 07/05/2011 0523       IMPRESSIONPLAN: I had a lengthy discussion with the patient and her husband after reviewing her MRIs with them.  We spoke about whole brain radiotherapy versus stereotactic radiosurgery to the  brain. We spoke about the differing risks, benefits and side effects of both of these treatments. During part of our discussion, we spoke about the cognitive side effects and fatigue that can result from whole brain radiotherapy and we spoke about radionecrosis that can result from stereotactic radiosurgery. I explained that whole brain radiotherapy is more comprehensive and therefore can decrease the chance of recurrences elsewhere in the brain while stereotactic radiosurgery only treats the areas of gross disease while sparing the rest of the brain parenchyma.  She understands that by pursuing stereotactic radiosurgery, this will not necessarily prevent an eventual need for whole brain radiation. We discussed holding whole brain radiation as a possible backup plan if she develops diffuse brain metastases in the future.  After lengthy discussion, the patient and her husband would like to proceed with stereotactic radiosurgery to the 4 brain metastases.   I anticipate delivering 20 Gray in 1 fraction to each of these metastases.  It was a pleasure meeting the patient today. We discussed the risks, benefits, and side effects of radiotherapy. No guarantees of treatment were given. A consent form was signed and placed in the patient's medical record. The patient is enthusiastic about proceeding with treatment. I look forward to participating in the patient's care. She will proceed with CT simulation today, anticipating treatments in 2 days.     -----------------------------------  Lonie Peak, MD

## 2011-07-18 ENCOUNTER — Ambulatory Visit
Admission: RE | Admit: 2011-07-18 | Discharge: 2011-07-18 | Disposition: A | Discharge status: Home / Self Care | Payer: BC Managed Care – PPO | Source: Ambulatory Visit | Attending: Radiation Oncology | Admitting: Radiation Oncology

## 2011-07-18 ENCOUNTER — Other Ambulatory Visit: Payer: Self-pay | Admitting: Radiation Oncology

## 2011-07-18 ENCOUNTER — Ambulatory Visit: Payer: BC Managed Care – PPO | Admitting: Radiation Oncology

## 2011-07-18 NOTE — Progress Notes (Signed)
CC:   [name], M.D.  DIAGNOSIS:  Stage IV adenocarcinoma of Kimberly lung, EGFR positive.  CURRENT THERAPY: 1. Moreno to start Tarceva 150 mg p.o. daily this week. 2. Moreno to have stereotactic radiosurgery for asymptomatic brain     metastases. 3. Moreno is status post palliative radiation therapy to Kimberly thoracic     spine. 4. Moreno is status post vertebroplasty to Kimberly spine.  INTERIM HISTORY:  [name] comes in for [name] 2nd visit.  [name] was hospitalized because of pain problems.  We were able to get [name] pain under very good control due to vertebroplasty by Dr. Corliss Skains.  He did 3 levels.  This helped [name] out quite Kimberly bit.  [name] also started radiation therapy to Kimberly spine because of spinal metastases. With [name] vertebroplasties, [name] had biopsies taken.  Kimberly biopsies all came back positive for adenocarcinoma.  These were similar to [name] lung biopsy. Thankfully, [name] tumor is positive for Kimberly EGFR mutation.  As such, we are starting [name] on Tarceva.  [name] is also getting bisphosphonate therapy.  We do need to set [name] up for that when [name] comes back.  [name] is on Kimberly fentanyl patch.  [name] wants to try to stop this.  I told [name] it was okay for [name] to try to stop this.  [name] is having Kimberly little bit of odynophagia from Kimberly radiation.  I think [name] was given some Carafate.  There is no problem with [name] bowels.  There is no cough.  [name] has no bleeding.  There is no headache.  We did Kimberly MRI of [name] brain while in Kimberly hospital.  [name] is asymptomatic but yet I still felt that Kimberly MRI would be helpful.  Kimberly MRI did show that there was Kimberly 10 mm lesion in Kimberly right thalamus.  [name] had 2 additional lesions that were noted, 1 was 9 mm in Kimberly right inferior frontal and Kimberly 6 mm lesion in Kimberly right medial occipital lobe. [name] will have [name] stereotactic radiosurgery this week.  PHYSICAL EXAMINATION:  This is Kimberly well-developed, well-nourished white Moreno in no obvious distress.  Vital signs:   Temperature of 97.5, pulse 89, respiratory rate 20, blood pressure 127/79.  Weight is 168.  Head and neck:  Normocephalic, atraumatic skull.  There are no ocular or oral lesions.  There are no palpable cervical or supraclavicular lymph nodes. Lungs:  Clear to percussion auscultation.  Cardiac:  Regular rate and rhythm with Kimberly normal S1 and S2.  There are no murmurs, rubs or bruits. Abdomen:  Soft with good bowel sounds.  There is no palpable abdominal mass.  There is no palpable hepatosplenomegaly.  Extremities:  No clubbing, cyanosis or edema.  Neurological:  No focal neurological deficits.  Skin:  No rashes, ecchymosis or petechia.  LABORATORY STUDIES:  White cell count 15.4, hemoglobin 13.8, hematocrit 40.9, platelet count 392.  IMPRESSION:  [name] is Kimberly 56 year old white Moreno with metastatic adenocarcinoma of Kimberly lung.  [name] has never smoked.  Again, [name] does have Kimberly EGFR mutation.  Hopefully, [name] will respond favorably to Tarceva versus chemotherapy.  I will plan to get [name] back in 1 month.  I do need to set [name] up with [facility] when [name] comes back because of [name] bone metastasis.  I spent Kimberly good 45 minutes with them today talking about our plans now that we have more information about [name] metastatic adenocarcinoma of Kimberly lung.    ______________________________ Josph Macho, M.D.  PRE/MEDQ  D:  07/17/2011  T:  07/18/2011  Job:  [id number]

## 2011-07-18 NOTE — Addendum Note (Signed)
Encounter addended by: Delynn Flavin, RN on: 07/18/2011  1:07 PM<BR>     Documentation filed: Charges VN

## 2011-07-19 ENCOUNTER — Ambulatory Visit: Payer: BC Managed Care – PPO

## 2011-07-19 ENCOUNTER — Encounter: Payer: Self-pay | Admitting: Radiation Oncology

## 2011-07-19 ENCOUNTER — Ambulatory Visit
Admission: RE | Admit: 2011-07-19 | Discharge: 2011-07-19 | Disposition: A | Discharge status: Home / Self Care | Payer: BC Managed Care – PPO | Source: Ambulatory Visit | Attending: Radiation Oncology | Admitting: Radiation Oncology

## 2011-07-19 VITALS — BP 125/74 | HR 92 | Temp 97.1°F | Resp 18

## 2011-07-19 DIAGNOSIS — C7931 Secondary malignant neoplasm of brain: Secondary | ICD-10-CM

## 2011-07-19 DIAGNOSIS — Z923 Personal history of irradiation: Secondary | ICD-10-CM

## 2011-07-19 HISTORY — DX: Personal history of irradiation: Z92.3

## 2011-07-19 MED ORDER — LORAZEPAM 1 MG PO TABS
1.0000 mg | ORAL_TABLET | Freq: Once | ORAL | Status: AC
  Administered 2011-07-19: 1 mg via ORAL
  Filled 2011-07-19: qty 1

## 2011-07-19 NOTE — Progress Notes (Signed)
[facility] Radiation Oncology End of Treatment Note  Name:[name]  Date: 07/19/2011 UJW:[id number] DOB:1955-06-07  DIAGNOSIS: Metastatic adenocarcinoma of the lung to the brain    INDICATION FOR TREATMENT: Palliative   TREATMENT DATES:  07/19/2011                        SITE/DOSE/ BEAMS/ENERGY:                   Right frontal 10mm target was treated using 4 Dynamic Conformal Arcs to a prescription dose of 20 Gy to the 81.3% isodose line with 6 MV photons, flattening free filter  Right Thalamic 9mm target was treated using 3 Dynamic Conformal Arcs to a prescription dose of 20 Gy to the 83.3% isodose line with 6 MV photons, flattening free filter   Left  frontal 5mm target was treated using 4 Dynamic Conformal Arcs to a prescription dose of 20 Gy to the 83.3% isodose line with 6 MV photons, flattening free filter  Left occipital 5mm target was treated using 4 Dynamic Conformal Arcs to a prescription dose of 20 Gy to the 80.0% isodose line with 6 MV photons, flattening free filter    Following delivery, the patient was transported to nursing in stable condition and monitored for possible acute effects.  Vital signs were recorded BP 125/74  Pulse 92  Temp 97.1 F (36.2 C) (Oral)  Resp 18. The patient tolerated treatment without significant acute effects, and was discharged to home in stable condition.    PLAN: Follow-up in one month.

## 2011-07-19 NOTE — Progress Notes (Addendum)
  Radiation Oncology         [phone]) [phone] ________________________________  Stereotactic Treatment Procedure Note  Name: MELVINIA ASHBY MRN: [id number]  Date: 07/19/2011  DOB: [birth year]  SPECIAL TREATMENT PROCEDURE  3D TREATMENT PLANNING AND DOSIMETRY:  The patient's radiation plan was reviewed and approved by neurosurgery and radiation oncology prior to treatment.  It showed 3-dimensional radiation distributions overlaid onto the planning CT/MRI image set.  The [facility] for the target structures as well as the organs at risk were reviewed. The documentation of the 3D plan and dosimetry are filed in the radiation oncology EMR.  NARRATIVE:  YVONDA FOUTY was brought to the TrueBeam stereotactic radiation treatment machine and placed supine on the CT couch. The head frame was applied, and the patient was set up for stereotactic radiosurgery.  Neurosurgery was present for the set-up and delivery  SIMULATION VERIFICATION:  In the couch zero-angle position, the patient underwent Exactrac imaging using the Brainlab system with orthogonal KV images.  These were carefully aligned and repeated to confirm treatment position for each of the isocenters.  The Exactrac snap film verification was repeated at each couch angle.  SPECIAL TREATMENT PROCEDURE: Legrand Como received stereotactic radiosurgery to the following targets:  Right frontal 10mm target was treated using 4 Dynamic Conformal Arcs to a prescription dose of 20 Gy to the 81.3% isodose line .  ExacTrac Snap verification was performed for each couch angle.  Right Thalamic 9mm target was treated using 3 Dynamic Conformal Arcs to a prescription dose of 20 Gy to the 83.3% isodose line.  ExacTrac Snap verification was performed for each couch angle.  Left  frontal 5mm target was treated using 4 Dynamic Conformal Arcs to a prescription dose of 20 Gy to the 83.3% isodose line .  ExacTrac Snap verification was performed for each couch  angle.  Left occipital 5mm target was treated using 4 Dynamic Conformal Arcs to a prescription dose of 20 Gy to the 80.0% isodose line .  ExacTrac Snap verification was performed for each couch angle.  This constitutes a special treatment procedure due to the ablation dose delivered and the technical nature of treatment.  This highly technical modality of treatment ensures that the ablative dose is centered on the patient's tumor while sparing normal tissues from excessive dose and risk of detrimental effects.   STEREOTACTIC TREATMENT MANAGEMENT:  Following delivery, the patient was transported to nursing in stable condition and monitored for possible acute effects.  Vital signs were recorded BP 125/74  Pulse 92  Temp 97.1 F (36.2 C) (Oral)  Resp 18. The patient tolerated treatment without significant acute effects, and was discharged to home in stable condition.    PLAN: Follow-up in one month.  _______________________________   Lonie Peak, MD

## 2011-07-19 NOTE — Progress Notes (Signed)
Patient sitting up in geri chair with family members at her side. Patient is alert and oriented to person, place, and time. No distress noted. Pleasant affect noted. Patient denies pain at this time. Patient reports slight headache persist but is no worse. Vitals WDL. Patient denies nausea, dizziness or diplopia. One hour nurse monitor complete. Paged Dr. Basilio Cairo to see patient prior to discharge.

## 2011-07-19 NOTE — Progress Notes (Signed)
Received patient and her family in the clinic following treatment. Patient is alert and oriented to person, place, and time. No distress noted. Steady gait noted. Pleasant affect noted. Patient denies pain at this time. Patient reports slight headache. Patient denies nausea, dizziness or diplopia. Vitals WDL. Provided patient with Sprite to drink. Darl Pikes provided patient with follow up appointment card. Encouraged patient to press call bell for any needs and she verbalized understanding.

## 2011-07-19 NOTE — Addendum Note (Signed)
Encounter addended by: Delynn Flavin, RN on: 07/19/2011 11:12 AM<BR>     Documentation filed: Charges VN

## 2011-07-21 ENCOUNTER — Ambulatory Visit
Admission: RE | Admit: 2011-07-21 | Discharge: 2011-07-21 | Disposition: A | Discharge status: Home / Self Care | Payer: BC Managed Care – PPO | Source: Ambulatory Visit | Attending: Radiation Oncology | Admitting: Radiation Oncology

## 2011-07-21 ENCOUNTER — Encounter: Payer: Self-pay | Admitting: Radiation Oncology

## 2011-07-21 VITALS — BP 120/76 | HR 100 | Temp 98.4°F | Resp 20 | Wt 169.3 lb

## 2011-07-21 DIAGNOSIS — C7951 Secondary malignant neoplasm of bone: Secondary | ICD-10-CM

## 2011-07-21 NOTE — Progress Notes (Signed)
   Department of Radiation Oncology  Phone:  [phone] Fax:        [phone]  Weekly Treatment Note    Name: DEFNE GERLING Date: 07/21/2011 MRN: [id number] DOB: 1955-09-28   Current dose: 30 Gy  Current fraction: 10   MEDICATIONS: Current Outpatient Prescriptions  Medication Sig Dispense Refill  . ALPRAZolam (XANAX) 0.25 MG tablet Take 0.25-0.5 mg by mouth every 8 (eight) hours as needed. For anxiety.      . bismuth subsalicylate (PEPTO BISMOL) 262 MG chewable tablet Chew 524 mg by mouth daily as needed. For indigestion.      Marland Kitchen dexamethasone (DECADRON) 4 MG tablet 4 mg 2 (two) times daily with a meal.       . diphenhydrAMINE (BENADRYL) 25 mg capsule Take 25 mg by mouth at bedtime as needed. For sleep.      Marland Kitchen doxycycline (VIBRA-TABS) 100 MG tablet Take 1 tablet (100 mg total) by mouth 2 (two) times daily.  60 tablet  6  . erlotinib (TARCEVA) 150 MG tablet Take 1 tablet (150 mg total) by mouth daily. Take on an empty stomach 1 hour before meals or 2 hours after.  30 tablet  2  . estradiol (VIVELLE-DOT) 0.0375 MG/24HR Place 1 patch onto the skin 2 (two) times a week. Apply on Tuesdays and on Saturdays.      . hydrocortisone cream 0.5 % Like to skin at bedtime every night starting the day before Tarceva.  60 g  6  . ibuprofen (ADVIL,MOTRIN) 200 MG tablet Take 200 mg by mouth every 6 (six) hours as needed. For headache.      . medroxyPROGESTERone (PROVERA) 5 MG tablet Take 2.5 mg by mouth daily.      . promethazine (PHENERGAN) 12.5 MG tablet Take 1-2, if needed, every 6 hours for nausea  60 tablet  3  . sucralfate (CARAFATE) 1 GM/10ML suspension Take 10 mLs (1 g total) by mouth 4 (four) times daily.  420 mL  1     ALLERGIES: Review of patient's allergies indicates no known allergies.   LABORATORY DATA:  Lab Results  Component Value Date   WBC 15.4* 07/17/2011   HGB 13.8 07/17/2011   HCT 40.9 07/17/2011   MCV 84 07/17/2011   PLT 392 07/17/2011   Lab Results  Component Value  Date   NA 138 07/17/2011   K 4.4 07/17/2011   CL 100 07/17/2011   CO2 27 07/17/2011   Lab Results  Component Value Date   ALT 34 07/17/2011   AST 14 07/17/2011   ALKPHOS 350* 07/17/2011   BILITOT 0.3 07/17/2011     NARRATIVE: Legrand Como was seen today for weekly treatment management. The chart was checked and the patient's films were reviewed. The patient completed her treatment today to the spine. She states that she has done very well. She denies any nausea. Her appetite is improving. She does complain of some difficulty sleeping.  PHYSICAL EXAMINATION: weight is 169 lb 4.8 oz (76.794 kg). Her oral temperature is 98.4 F (36.9 C). Her blood pressure is 120/76 and her pulse is 100. Her respiration is 20.      no thrush present  ASSESSMENT: The patient did satisfactorily with treatment.  PLAN: Followup with Dr. Dayton Scrape in one month. The patient is unsure of her Decadron dose and she is to call back to clinic regarding this.

## 2011-07-21 NOTE — Progress Notes (Signed)
Pt completed treatment today to T-L-S spine. She denies pain, nausea, vision issues. She does report difficulty falling asleep, fatigue, loss of appetite, but appetite is improving. Pt no longer on Duragesic patch or taking Flexeril or pain med. She is on Decadron, unknown strength. Will call pt later to confirm exact dose she is currently taking.  Pt has FU card.

## 2011-07-24 ENCOUNTER — Encounter: Payer: Self-pay | Admitting: Radiation Oncology

## 2011-07-24 NOTE — Progress Notes (Signed)
  Radiation Oncology         [phone] ________________________________  Name: [name] MRN: [id number]  Date: 07/21/2011  DOB: [birth year]  End of Treatment Note  CC: Dr. 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 Mixed 18 MV/10 MV photons parallel opposed fields to the thoracic spine  Narrative: The patient tolerated radiation treatment relatively well.   She also underwent vertebroplasty during her first week of therapy. She had excellent palliation. She is also found have metastatic disease to brain for which she underwent stereotactic radiosurgery under the direction of Dr. Basilio Cairo.  Plan: The patient has completed radiation treatment. The patient will return to radiation oncology clinic for routine followup in one month. I advised her to call or return sooner if she have any questions or concerns related to their recovery or treatment.  ------------------------------------------------        Maryln Gottron, MD

## 2011-07-25 ENCOUNTER — Ambulatory Visit (HOSPITAL_COMMUNITY)
Admission: RE | Admit: 2011-07-25 | Discharge: 2011-07-25 | Disposition: A | Discharge status: Home / Self Care | Payer: BC Managed Care – PPO | Source: Ambulatory Visit | Attending: Interventional Radiology | Admitting: Interventional Radiology

## 2011-07-25 DIAGNOSIS — Z09 Encounter for follow-up examination after completed treatment for conditions other than malignant neoplasm: Secondary | ICD-10-CM

## 2011-07-25 DIAGNOSIS — IMO0002 Reserved for concepts with insufficient information to code with codable children: Secondary | ICD-10-CM

## 2011-07-28 ENCOUNTER — Other Ambulatory Visit: Payer: Self-pay | Admitting: *Deleted

## 2011-07-28 ENCOUNTER — Other Ambulatory Visit: Payer: Self-pay

## 2011-07-28 DIAGNOSIS — G4701 Insomnia due to medical condition: Secondary | ICD-10-CM

## 2011-07-28 DIAGNOSIS — C3490 Malignant neoplasm of unspecified part of unspecified bronchus or lung: Secondary | ICD-10-CM

## 2011-07-28 MED ORDER — ZOLPIDEM TARTRATE 10 MG PO TABS: ORAL_TABLET | ORAL | Status: DC

## 2011-07-28 NOTE — Telephone Encounter (Signed)
Pt called with c/o insomnia, int. diarrhea, & loss of appetite. She has tried several OTC sleep aids which no longer work. Requests something rx. Ok to start Ambien per Dr. Myna Hidalgo. To take Imodium for diarrhea (which she states is only every so often and once maybe twice at a time). Encouraged fluid intake when this happens especially Gatorade, or Pedialyte. Also left a message with Vernell Leep, dietitian at the [facility] office in [place] office asking her to call the pt to set up an appt. She is interested in meeting with her to see what can be done to help with her appetite and changes in taste. Reviewed some things with her but she was very interested with a nutrition consult. She verbalized understanding of all the information discussed and knows to call back if she has any further questions or concerns.

## 2011-08-14 NOTE — Addendum Note (Signed)
Encounter addended by: Lonie Peak, MD on: 08/14/2011  6:39 PM<BR>     Documentation filed: Notes Section

## 2011-08-17 ENCOUNTER — Telehealth: Payer: Self-pay | Admitting: *Deleted

## 2011-08-17 NOTE — Telephone Encounter (Signed)
CALLED PATIENT TO ASK QUESTION, LVM FOR A RETURN CALL 

## 2011-08-18 ENCOUNTER — Ambulatory Visit (HOSPITAL_BASED_OUTPATIENT_CLINIC_OR_DEPARTMENT_OTHER): Payer: BC Managed Care – PPO

## 2011-08-18 ENCOUNTER — Other Ambulatory Visit (HOSPITAL_BASED_OUTPATIENT_CLINIC_OR_DEPARTMENT_OTHER): Payer: BC Managed Care – PPO | Admitting: Lab

## 2011-08-18 ENCOUNTER — Encounter: Payer: Self-pay | Admitting: Radiation Oncology

## 2011-08-18 ENCOUNTER — Ambulatory Visit: Payer: BC Managed Care – PPO | Admitting: Hematology & Oncology

## 2011-08-18 ENCOUNTER — Other Ambulatory Visit: Payer: BC Managed Care – PPO | Admitting: Lab

## 2011-08-18 ENCOUNTER — Ambulatory Visit (HOSPITAL_BASED_OUTPATIENT_CLINIC_OR_DEPARTMENT_OTHER): Payer: BC Managed Care – PPO | Admitting: Hematology & Oncology

## 2011-08-18 VITALS — BP 123/86 | HR 80 | Temp 97.0°F

## 2011-08-18 VITALS — BP 130/88 | HR 92 | Temp 97.0°F | Resp 16 | Ht 62.0 in | Wt 162.0 lb

## 2011-08-18 DIAGNOSIS — C7951 Secondary malignant neoplasm of bone: Secondary | ICD-10-CM

## 2011-08-18 DIAGNOSIS — C3490 Malignant neoplasm of unspecified part of unspecified bronchus or lung: Secondary | ICD-10-CM | POA: Insufficient documentation

## 2011-08-18 DIAGNOSIS — C3430 Malignant neoplasm of lower lobe, unspecified bronchus or lung: Secondary | ICD-10-CM

## 2011-08-18 LAB — CBC WITH DIFFERENTIAL (CANCER CENTER ONLY)
BASO#: 0 10*3/uL (ref 0.0–0.2)
EOS%: 0 % (ref 0.0–7.0)
Eosinophils Absolute: 0 10*3/uL (ref 0.0–0.5)
HGB: 13.6 g/dL (ref 11.6–15.9)
LYMPH#: 0.2 10*3/uL — ABNORMAL LOW (ref 0.9–3.3)
MCHC: 35.1 g/dL (ref 32.0–36.0)
NEUT#: 7.5 10*3/uL — ABNORMAL HIGH (ref 1.5–6.5)
RBC: 4.56 10*6/uL (ref 3.70–5.32)

## 2011-08-18 LAB — COMPREHENSIVE METABOLIC PANEL
AST: 17 U/L (ref 0–37)
Albumin: 3.5 g/dL (ref 3.5–5.2)
BUN: 22 mg/dL (ref 6–23)
Calcium: 9.1 mg/dL (ref 8.4–10.5)
Chloride: 100 mEq/L (ref 96–112)
Potassium: 4.9 mEq/L (ref 3.5–5.3)
Sodium: 136 mEq/L (ref 135–145)
Total Protein: 5.1 g/dL — ABNORMAL LOW (ref 6.0–8.3)

## 2011-08-18 MED ORDER — ZOLEDRONIC ACID 4 MG/100ML IV SOLN
4.0000 mg | Freq: Once | INTRAVENOUS | Status: AC
  Administered 2011-08-18: 4 mg via INTRAVENOUS
  Filled 2011-08-18: qty 100

## 2011-08-18 NOTE — Patient Instructions (Signed)
Zoledronic Acid injection (Hypercalcemia, Oncology) What is this medicine? ZOLEDRONIC ACID (ZOE le dron ik AS id) lowers the amount of calcium loss from bone. It is used to treat too much calcium in your blood from cancer. It is also used to prevent complications of cancer that has spread to the bone. This medicine may be used for other purposes; ask your health care provider or pharmacist if you have questions. What should I tell my health care provider before I take this medicine? They need to know if you have any of these conditions: -aspirin-sensitive asthma -dental disease -kidney disease -an unusual or allergic reaction to zoledronic acid, other medicines, foods, dyes, or preservatives -pregnant or trying to get pregnant -breast-feeding How should I use this medicine? This medicine is for infusion into a vein. It is given by a health care professional in a hospital or clinic setting. Talk to your pediatrician regarding the use of this medicine in children. Special care may be needed. Overdosage: If you think you have taken too much of this medicine contact a poison control center or emergency room at once. NOTE: This medicine is only for you. Do not share this medicine with others. What if I miss a dose? It is important not to miss your dose. Call your doctor or health care professional if you are unable to keep an appointment. What may interact with this medicine? -certain antibiotics given by injection -NSAIDs, medicines for pain and inflammation, like ibuprofen or naproxen -some diuretics like bumetanide, furosemide -teriparatide -thalidomide This list may not describe all possible interactions. Give your health care provider a list of all the medicines, herbs, non-prescription drugs, or dietary supplements you use. Also tell them if you smoke, drink alcohol, or use illegal drugs. Some items may interact with your medicine. What should I watch for while using this medicine? Visit  your doctor or health care professional for regular checkups. It may be some time before you see the benefit from this medicine. Do not stop taking your medicine unless your doctor tells you to. Your doctor may order blood tests or other tests to see how you are doing. Women should inform their doctor if they wish to become pregnant or think they might be pregnant. There is a potential for serious side effects to an unborn child. Talk to your health care professional or pharmacist for more information. You should make sure that you get enough calcium and vitamin D while you are taking this medicine. Discuss the foods you eat and the vitamins you take with your health care professional. Some people who take this medicine have severe bone, joint, and/or muscle pain. This medicine may also increase your risk for a broken thigh bone. Tell your doctor right away if you have pain in your upper leg or groin. Tell your doctor if you have any pain that does not go away or that gets worse. What side effects may I notice from receiving this medicine? Side effects that you should report to your doctor or health care professional as soon as possible: -allergic reactions like skin rash, itching or hives, swelling of the face, lips, or tongue -anxiety, confusion, or depression -breathing problems -changes in vision -feeling faint or lightheaded, falls -jaw burning, cramping, pain -muscle cramps, stiffness, or weakness -trouble passing urine or change in the amount of urine Side effects that usually do not require medical attention (report to your doctor or health care professional if they continue or are bothersome): -bone, joint, or muscle pain -  fever -hair loss -irritation at site where injected -loss of appetite -nausea, vomiting -stomach upset -tired This list may not describe all possible side effects. Call your doctor for medical advice about side effects. You may report side effects to FDA at  1-800-FDA-1088. Where should I keep my medicine? This drug is given in a hospital or clinic and will not be stored at home. NOTE: This sheet is a summary. It may not cover all possible information. If you have questions about this medicine, talk to your doctor, pharmacist, or health care provider.  2012, Elsevier/Gold Standard. (07/01/2010 9:06:58 AM) 

## 2011-08-18 NOTE — Progress Notes (Signed)
This office note has been dictated.

## 2011-08-19 NOTE — Progress Notes (Signed)
CC:   [name], M.D.  DIAGNOSIS:  Metastatic adenocarcinoma of the lung, EGFR positive.  CURRENT THERAPY:  Tarceva 150 mg p.o. daily.  INTERIM HISTORY:  [name] comes in for followup.  She started Tarceva about a month ago.  She has done okay with this.  She has had some soreness in her mouth.  She has not noticed any obvious mucositis.  Her skin has been doing okay.  We do have her on a regimen to make sure that her skin does not become too involved with the Tarceva.  She is still on Decadron.  She is on 4 mg twice a day.  She has had stereotactic radiosurgery to the brain.  I think we can probably taper her down off the Decadron.  I gave her a schedule for this.  She is weak in her thighs.  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 This should improve as she gets down off the Decadron.  She is constipated.  She has been trying some different things at home to try to help with the constipation.  There has been no cough.  She has had no nausea or vomiting.  She has had no fever.  She has not noticed any leg swelling.  PHYSICAL EXAMINATION:  General:  This is a well-developed, well- nourished white female who may be slightly cushingoid appearing.  Vital signs:  Her vital signs show temperature of 97, pulse 92, respiratory rate 16, blood pressure 130/88.  Weight is 162.  Head and neck: Normocephalic, atraumatic skull.  There are no ocular or oral lesions. There are no palpable cervical or supraclavicular lymph nodes.  Lungs: Clear bilaterally.  Cardiac:  Regular rate and rhythm with a normal S1 and S2.  There are no murmurs, rubs or bruits.  Abdomen:  Soft with good bowel sounds.  There is no palpable abdominal mass.  There is no palpable hepatosplenomegaly.  Back:  No tenderness over the spine, ribs or hips.  Extremities:  Shows no clubbing, cyanosis or edema.  She has good strength in her legs bilaterally.  Skin:  Shows no rashes, ecchymoses or petechiae.  Neurologic:   No focal neurological deficits.  LABORATORY STUDIES:  White cell count is 8, hemoglobin 13.6, hematocrit 38.7, platelet count is 315.  IMPRESSION:  [name] is a 55 year old white female who has never smoked.  She has metastatic adenocarcinoma of the lung.  Her tumor, thankfully, is EGFR positive.  We will continue on the Tarceva.  We will get her through all of this on Tarceva and then reassess her with scans.    ______________________________ Josph Macho, M.D. PRE/MEDQ  D:  08/18/2011  T:  08/19/2011  Job:  [id number]

## 2011-08-21 ENCOUNTER — Other Ambulatory Visit: Payer: Self-pay | Admitting: Radiation Therapy

## 2011-08-21 ENCOUNTER — Ambulatory Visit
Admission: RE | Admit: 2011-08-21 | Discharge: 2011-08-21 | Disposition: A | Discharge status: Home / Self Care | Payer: BC Managed Care – PPO | Source: Ambulatory Visit | Attending: Radiation Oncology | Admitting: Radiation Oncology

## 2011-08-21 ENCOUNTER — Encounter: Payer: Self-pay | Admitting: Radiation Oncology

## 2011-08-21 ENCOUNTER — Telehealth: Payer: Self-pay | Admitting: Hematology & Oncology

## 2011-08-21 VITALS — BP 137/81 | HR 98 | Temp 97.6°F | Resp 20

## 2011-08-21 DIAGNOSIS — C7931 Secondary malignant neoplasm of brain: Secondary | ICD-10-CM

## 2011-08-21 DIAGNOSIS — C7951 Secondary malignant neoplasm of bone: Secondary | ICD-10-CM

## 2011-08-21 NOTE — Progress Notes (Signed)
Pt states she has had mid to low back pain, occassionally all over back pain x 2 weeks. At least twice daily the pain radiates around to her mid chest where she feels pressure and feels unable to take deep breath. Generally takes Ibuprofen 400 mg q6hrs daily. She also c/o dull headaches x 1 week, occur daily, Ibuprofen prn. States vision "is fuzzy". Pt c/o fatigue, weakness of legs. Denies nausea, loss of appetite. Pt on Decadron taper per Dr Myna Hidalgo, current dose 4 mg tab once daily.

## 2011-08-21 NOTE — Progress Notes (Addendum)
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 All 4 lesions received 20 Gray in 1 fraction   Narrative:  The patient returns today for routine follow-up. She is tapering off of Decadron. She completes Decadron in approximately one and a half weeks. She is taking 4 mg a day currently. She denies nausea. She does have some headaches which she attributes to allergies. She feels stuffy. Her vision is sometimes fuzzy but mainly only when she is looking at a computer screen for a while. She reports some new back pain in her upper and lower thoracic spine in her upper lumbar spine which is going to discuss further with Dr. Dayton Scrape in followup today. She believes she is scheduled for CT scan on August 25 with subsequent followup with Dr. Myna Hidalgo. 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                           ALLERGIES:   has no known allergies.  Meds: Current Outpatient Prescriptions  Medication Sig Dispense Refill  . ALPRAZolam (XANAX) 0.25 MG tablet Take 0.25-0.5 mg by mouth every 8 (eight) hours as needed. For anxiety.      Marland Kitchen aspirin 325 MG tablet Take 325 mg by mouth every 4 (four) hours as needed.      . bismuth subsalicylate (PEPTO BISMOL) 262 MG chewable tablet Chew 524 mg by mouth daily as needed. For indigestion.      . calcium citrate-vitamin D 200-200 MG-UNIT TABS Take 1 tablet by mouth daily.      Marland Kitchen dexamethasone (DECADRON) 4 MG tablet 4 mg 2 (two) times daily with a meal.       .  diphenhydrAMINE (BENADRYL) 25 mg capsule Take 25 mg by mouth at bedtime as needed. For sleep.      Marland Kitchen doxycycline (VIBRA-TABS) 100 MG tablet Take 100 mg by mouth Twice daily.      Marland Kitchen estradiol (VIVELLE-DOT) 0.0375 MG/24HR Place 1 patch onto the skin 2 (two) times a week. Apply on Tuesdays and on Saturdays.      . hydrocortisone cream 0.5 % Like to skin at bedtime every night starting the day before Tarceva.  60 g  6  . ibuprofen (ADVIL,MOTRIN) 200 MG tablet Take 200 mg by mouth every 6 (six) hours as needed. For headache.      . medroxyPROGESTERone (PROVERA) 5 MG tablet Take 2.5 mg by mouth daily.      . phenylephrine (SUDAFED PE) 10 MG TABS Take 10 mg by mouth every 4 (four) hours as needed.      . promethazine (PHENERGAN) 12.5 MG tablet Take 1-2, if needed, every 6 hours for nausea  60 tablet  3  . TARCEVA 150 MG tablet Take 150 mg by mouth Daily.      Marland Kitchen zolpidem (AMBIEN) 10 MG tablet Take 0.5 to 1 tab at night as needed for sleep  30 tablet  2    Physical Findings: The patient is in no acute  distress. Patient is alert and oriented. Blood pressure is 137/81, temperature 97.6 pulse 98 respiratory rate 20 oxygen saturation 97% on room air; weight 162.1 pounds She has slightly cushingoid faces; no thrush in her oropharynx. Extraocular movements are intact. No facial droop. Tongue is midline. Strength is intact in her upper extremities. She demonstrates some weakness in her quadriceps bilaterally. She denies numbness. Finger to nose testing and Rapidly alternating movements are intact. No swelling in her lower extremities.  Lab Findings: Lab Results  Component Value Date   WBC 8.0 08/18/2011   HGB 13.6 08/18/2011   HCT 38.7 08/18/2011   MCV 85 08/18/2011   PLT 315 08/18/2011    CMP     Component Value Date/Time   NA 136 08/18/2011 1346   K 4.9 08/18/2011 1346   CL 100 08/18/2011 1346   CO2 25 08/18/2011 1346   GLUCOSE 77 08/18/2011 1346   BUN 22 08/18/2011 1346   CREATININE 0.88 08/18/2011 1346   CALCIUM  9.1 08/18/2011 1346   PROT 5.1* 08/18/2011 1346   ALBUMIN 3.5 08/18/2011 1346   AST 17 08/18/2011 1346   ALT 28 08/18/2011 1346   ALKPHOS 152* 08/18/2011 1346   BILITOT 0.9 08/18/2011 1346   GFRNONAA >90 07/05/2011 0523   GFRAA >90 07/05/2011 0523      Radiographic Findings: Ir Radiologist Eval & Mgmt  07/26/2011  *RADIOLOGY REPORT*  Clinical Data: 56 year old female; thoracic #8; lumbar #1; lumbar #4 kyphoplasty performed July 06, 2011  CONSULTATION: Thoracic #8; lumbar #1; lumbar #4 kyphoplasty and tumor ablation performed July 06, 2011.  The patient has done very well.  She denies any pain remaining at all.  She is not taking any pain medications at all.  She still does not eat or sleep all that well with this was even prior to the procedure. She has returned to work approximately 4 hours every morning.  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  Objective : The patient ambulating without help.  Kyphoplasty sites are nontender; no bleeding; no sign of infection; no redness or swelling.  Medications:  No change  Findings: Approximately 20 minutes was spent with this patient. Imaging of recent kyphoplasty and tumor ablation was reviewed with the patient.  All questions were answered to satisfaction.  She is to return if symptoms recur.  Encouraged to keep appointment with Dr. Myna Hidalgo in August 2013.  IMPRESSION: 1.  The patient reports significant improvement in her pain following the ablation procedure.   Will see her  on a p.r.n. basis.  Original Report Authenticated By: Oneal Grout, M.D.    Impression:  The patient is recovering from the effects of radiation.  Plan:  Followup in approximately one month with an MRI of her brain at that time. She has fairly nonspecific symptoms that I do not think are likely due to her brain tumors but I would like [place] to review this MRI in about one month for reassurance.  She'll continue to be followed by Dr.  Myna Hidalgo. She'll see Dr. Dayton Scrape later today. _____________________________________   Lonie Peak, MD

## 2011-08-21 NOTE — Progress Notes (Signed)
Followup note: Ms. 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 She had vertebroplasties at T8, L1, and L4. She is also seen today for followup by Dr. 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 She saw Dr. Myna Hidalgo last week and he has her on Tarceva for her EGFR positive adenocarcinoma. She does report diffuse spine discomfort from the mid T-spine area down through her lumbar spine. She attributes this to overactivity. Her pain is poorly localized. Her pain gets as bad as 5-6/10, but she is not taking any narcotics. She does have hydromorphone and also a fentanyl patch to use if necessary. Dr. Myna Hidalgo plans on repeating her CT scan of the chest and abdomen on August 25. I believe that she is being scheduled for a bone scan as well.  Physical examination: She looks well. Wt Readings from Last 3 Encounters:  08/18/11 162 lb (73.483 kg)  07/21/11 169 lb 4.8 oz (76.794 kg)  07/17/11 168 lb (76.204 kg)   Temp Readings from Last 3 Encounters:  08/21/11 97.6 F (36.4 C) Oral  08/18/11 97 F (36.1 C)   08/18/11 97 F (36.1 C) Oral   BP Readings from Last 3 Encounters:  08/21/11 137/81  08/18/11 123/86  08/18/11 130/88   Pulse Readings from Last 3 Encounters:  08/21/11 98  08/18/11 80  08/18/11 92    Back: There is no focal discomfort on palpation of the thoracic, lumbar, or sacral spine. Extremities: Trace ankle edema. Neurologic examination: Site proximal weakness, nonfocal.  Impression: Overall, I feel that she is doing doing well although she does have back discomfort which occurs not particularly bothersome and not required narcotics. Dr. Basilio Cairo is moving up her followup brain MR because of mild headaches. She is scheduled for repeat scanning of the chest, abdomen, and pelvis by  Dr. Myna Hidalgo later this month.  Plan: I have not scheduled [name] for a followup visit with me provided that she maintains her followup with Dr. Myna Hidalgo and Dr. Basilio Cairo. She knows to call one of [place] if she develops worsening pain, weakness or numbness in her extremities, or difficulty with bladder or bowel control.

## 2011-08-21 NOTE — Telephone Encounter (Signed)
Left message informing patient of 09/11/11 CT appointment and 09/19/11 appointment with lab and doctor.  Also mailed calendar to patient's home address.

## 2011-08-22 ENCOUNTER — Ambulatory Visit: Payer: BC Managed Care – PPO | Admitting: Radiation Oncology

## 2011-08-23 ENCOUNTER — Ambulatory Visit: Payer: BC Managed Care – PPO | Admitting: Radiation Oncology

## 2011-08-23 ENCOUNTER — Encounter: Payer: BC Managed Care – PPO | Admitting: Nutrition

## 2011-08-29 ENCOUNTER — Telehealth: Payer: Self-pay | Admitting: Hematology & Oncology

## 2011-08-29 NOTE — Telephone Encounter (Signed)
Left pt message to call about 8-26 appointmnet

## 2011-08-30 ENCOUNTER — Ambulatory Visit: Payer: BC Managed Care – PPO | Admitting: Radiation Oncology

## 2011-09-04 ENCOUNTER — Ambulatory Visit (HOSPITAL_COMMUNITY)
Admission: RE | Admit: 2011-09-04 | Discharge: 2011-09-04 | Disposition: A | Discharge status: Home / Self Care | Payer: BC Managed Care – PPO | Source: Ambulatory Visit | Attending: Hematology & Oncology | Admitting: Hematology & Oncology

## 2011-09-04 ENCOUNTER — Encounter: Payer: Self-pay | Admitting: *Deleted

## 2011-09-04 ENCOUNTER — Other Ambulatory Visit: Payer: Self-pay | Admitting: Hematology & Oncology

## 2011-09-04 ENCOUNTER — Inpatient Hospital Stay (HOSPITAL_COMMUNITY): Admission: RE | Admit: 2011-09-04 | Payer: BC Managed Care – PPO | Source: Ambulatory Visit

## 2011-09-04 DIAGNOSIS — S22009A Unspecified fracture of unspecified thoracic vertebra, initial encounter for closed fracture: Secondary | ICD-10-CM | POA: Insufficient documentation

## 2011-09-04 DIAGNOSIS — C3490 Malignant neoplasm of unspecified part of unspecified bronchus or lung: Secondary | ICD-10-CM

## 2011-09-04 DIAGNOSIS — M546 Pain in thoracic spine: Secondary | ICD-10-CM | POA: Insufficient documentation

## 2011-09-04 DIAGNOSIS — C7951 Secondary malignant neoplasm of bone: Secondary | ICD-10-CM | POA: Insufficient documentation

## 2011-09-04 DIAGNOSIS — X58XXXA Exposure to other specified factors, initial encounter: Secondary | ICD-10-CM | POA: Insufficient documentation

## 2011-09-04 MED ORDER — GADOBENATE DIMEGLUMINE 529 MG/ML IV SOLN
15.0000 mL | Freq: Once | INTRAVENOUS | Status: AC | PRN
  Administered 2011-09-04: 15 mL via INTRAVENOUS

## 2011-09-06 ENCOUNTER — Encounter (HOSPITAL_BASED_OUTPATIENT_CLINIC_OR_DEPARTMENT_OTHER): Payer: Self-pay | Admitting: *Deleted

## 2011-09-06 ENCOUNTER — Emergency Department (HOSPITAL_BASED_OUTPATIENT_CLINIC_OR_DEPARTMENT_OTHER): Payer: BC Managed Care – PPO

## 2011-09-06 ENCOUNTER — Emergency Department (HOSPITAL_BASED_OUTPATIENT_CLINIC_OR_DEPARTMENT_OTHER)
Admission: EM | Admit: 2011-09-06 | Discharge: 2011-09-06 | Disposition: A | Discharge status: Home / Self Care | Payer: BC Managed Care – PPO | Attending: Emergency Medicine | Admitting: Emergency Medicine

## 2011-09-06 ENCOUNTER — Telehealth (HOSPITAL_COMMUNITY): Payer: Self-pay

## 2011-09-06 DIAGNOSIS — N390 Urinary tract infection, site not specified: Secondary | ICD-10-CM | POA: Insufficient documentation

## 2011-09-06 DIAGNOSIS — Z85841 Personal history of malignant neoplasm of brain: Secondary | ICD-10-CM | POA: Insufficient documentation

## 2011-09-06 DIAGNOSIS — K589 Irritable bowel syndrome without diarrhea: Secondary | ICD-10-CM | POA: Insufficient documentation

## 2011-09-06 DIAGNOSIS — Z79899 Other long term (current) drug therapy: Secondary | ICD-10-CM | POA: Insufficient documentation

## 2011-09-06 DIAGNOSIS — R079 Chest pain, unspecified: Secondary | ICD-10-CM | POA: Insufficient documentation

## 2011-09-06 DIAGNOSIS — Z85118 Personal history of other malignant neoplasm of bronchus and lung: Secondary | ICD-10-CM | POA: Insufficient documentation

## 2011-09-06 DIAGNOSIS — Z85830 Personal history of malignant neoplasm of bone: Secondary | ICD-10-CM | POA: Insufficient documentation

## 2011-09-06 DIAGNOSIS — R509 Fever, unspecified: Secondary | ICD-10-CM

## 2011-09-06 LAB — COMPREHENSIVE METABOLIC PANEL
AST: 25 U/L (ref 0–37)
Albumin: 2.8 g/dL — ABNORMAL LOW (ref 3.5–5.2)
Alkaline Phosphatase: 168 U/L — ABNORMAL HIGH (ref 39–117)
Chloride: 99 mEq/L (ref 96–112)
Potassium: 3.1 mEq/L — ABNORMAL LOW (ref 3.5–5.1)
Sodium: 134 mEq/L — ABNORMAL LOW (ref 135–145)
Total Bilirubin: 0.7 mg/dL (ref 0.3–1.2)
Total Protein: 5.5 g/dL — ABNORMAL LOW (ref 6.0–8.3)

## 2011-09-06 LAB — URINALYSIS, ROUTINE W REFLEX MICROSCOPIC
Bilirubin Urine: NEGATIVE
Glucose, UA: NEGATIVE mg/dL
Protein, ur: NEGATIVE mg/dL
Specific Gravity, Urine: 1.019 (ref 1.005–1.030)
Urobilinogen, UA: 0.2 mg/dL (ref 0.0–1.0)

## 2011-09-06 LAB — CBC WITH DIFFERENTIAL/PLATELET
Basophils Absolute: 0 10*3/uL (ref 0.0–0.1)
Basophils Relative: 1 % (ref 0–1)
Eosinophils Absolute: 0.1 10*3/uL (ref 0.0–0.7)
Hemoglobin: 12.5 g/dL (ref 12.0–15.0)
MCHC: 35.4 g/dL (ref 30.0–36.0)
Monocytes Relative: 8 % (ref 3–12)
Neutro Abs: 3.5 10*3/uL (ref 1.7–7.7)
Neutrophils Relative %: 85 % — ABNORMAL HIGH (ref 43–77)
Platelets: 363 10*3/uL (ref 150–400)
RDW: 20.9 % — ABNORMAL HIGH (ref 11.5–15.5)

## 2011-09-06 LAB — URINE MICROSCOPIC-ADD ON

## 2011-09-06 MED ORDER — CIPROFLOXACIN HCL 500 MG PO TABS: 500.0000 mg | ORAL_TABLET | Freq: Two times a day (BID) | ORAL | Status: DC

## 2011-09-06 NOTE — ED Provider Notes (Signed)
History     CSN: [id number]  Arrival date & time 09/06/11  1710   First MD Initiated Contact with Patient 09/06/11 1810      Chief Complaint  Patient presents with  . Chills  . Fever    (Consider location/radiation/quality/duration/timing/severity/associated sxs/prior treatment) HPI Comments: Patient diagnosed in June of this year with metastatic lung cancer.  She has seen Dr. Myna Hidalgo for this and is being treated with Tarceva for this.  She states for the past few days she has had a fever off and on and chills that suddenly worsened today.  She denies any symptoms that would account for the fever such as cough, dysuria, sore throat, abd pain etc.  Patient is a 56 y.o. female presenting with fever. The history is provided by the patient.  Fever Primary symptoms of the febrile illness include fever. Primary symptoms do not include cough, shortness of breath, nausea, vomiting, diarrhea or dysuria. The current episode started 2 days ago. The problem has been gradually worsening.    Past Medical History  Diagnosis Date  . Pulmonary nodules 06/22/11    per CT scan  . IBS (irritable bowel syndrome)   . Fibroadenoma of breast     hx of  . Metastasis of unknown origin   . Hx of radiation therapy 07/05/11 -07/21/11    LS, Tspine  . Hx of radiation therapy 07/19/11    brain, SRS  . Adenocarcinoma of lung   . 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 Metastatic adenocarcinoma to brain     Past Surgical History  Procedure Date  . Lung biopsy   . Vertebroplasty     Family History  Problem Relation Age of Onset  . Cancer Mother     cervical  . Asthma Father     History  Substance Use Topics  . Smoking status: Never Smoker   . Smokeless tobacco: Never Used  . Alcohol Use: Yes     rarely    OB History    Grav Para Term Preterm Abortions TAB SAB Ect Mult Living                  Review of Systems  Constitutional: Positive for fever.  Respiratory: Negative for cough and  shortness of breath.   Gastrointestinal: Negative for nausea, vomiting and diarrhea.  Genitourinary: Negative for dysuria.  All other systems reviewed and are negative.    Allergies  Review of patient's allergies indicates no known allergies.  Home Medications   Current Outpatient Rx  Name Route Sig Dispense Refill  . DOXYCYCLINE HYCLATE 100 MG PO TABS Oral Take 100 mg by mouth Twice daily.    Marland Kitchen HYDROCODONE-IBUPROFEN 7.5-200 MG PO TABS Oral Take 1 tablet by mouth every 8 (eight) hours as needed. For pain.    Marland Kitchen HYDROCORTISONE 0.5 % EX CREA  Like to skin at bedtime every night starting the day before Tarceva. 60 g 6  . IBUPROFEN 200 MG PO TABS Oral Take 400 mg by mouth every 6 (six) hours as needed. For pain in back.    . MEDROXYPROGESTERONE ACETATE 5 MG PO TABS Oral Take 2.5 mg by mouth daily.    Marland Kitchen PROMETHAZINE HCL 12.5 MG PO TABS  12.5 mg daily as needed. Take 1-2, if needed, every 6 hours for nausea    . TARCEVA 150 MG PO TABS Oral Take 150 mg by mouth Daily.    Marland Kitchen ZOLPIDEM TARTRATE 10 MG PO TABS Oral Take 5 mg by  mouth daily as needed. Take 0.5 to 1 tab at night as needed for sleep    . ALPRAZOLAM 0.25 MG PO TABS Oral Take 0.25-0.5 mg by mouth every 8 (eight) hours as needed. For anxiety.    Marland Kitchen DEXAMETHASONE 4 MG PO TABS Oral Take 4 mg by mouth 2 (two) times daily with a meal.     . ESTRADIOL 0.0375 MG/24HR TD PTTW Transdermal Place 1 patch onto the skin 2 (two) times a week. Apply on Tuesdays and on Saturdays.      BP 111/70  Pulse 120  Temp 98.5 F (36.9 C) (Oral)  Resp 20  Ht 5\' 2"  (1.575 m)  Wt 160 lb (72.576 kg)  BMI 29.26 kg/m2  SpO2 98%  Physical Exam  Nursing note and vitals reviewed. Constitutional: She is oriented to person, place, and time. She appears well-developed and well-nourished. No distress.  HENT:  Head: Normocephalic and atraumatic.  Neck: Normal range of motion. Neck supple.  Cardiovascular: Normal rate and regular rhythm.  Exam reveals no gallop and  no friction rub.   No murmur heard. Pulmonary/Chest: Effort normal and breath sounds normal. No respiratory distress. She has no wheezes.  Abdominal: Soft. Bowel sounds are normal. She exhibits no distension. There is no tenderness.  Musculoskeletal: Normal range of motion.  Neurological: She is alert and oriented to person, place, and time.  Skin: Skin is warm and dry. She is not diaphoretic.    ED Course  Procedures (including critical care time)  Labs Reviewed - No data to display Mr Thoracic Spine W Wo Contrast  09/05/2011  *RADIOLOGY REPORT*  Clinical Data: Progressive back pain.  Osseous metastases of lung cancer.  Status post tumor ablation and vertebral augmentation at T8, L1, and L4.  MRI THORACIC SPINE WITHOUT AND WITH CONTRAST  Technique:  Multiplanar and multiecho pulse sequences of the thoracic spine were obtained without and with intravenous contrast.  Contrast: 15mL MULTIHANCE GADOBENATE DIMEGLUMINE 529 MG/ML IV SOLN  Comparison: MRI of the thoracic spine without and with contrast 07/04/2011.  Findings: The patient is status post vertebral augmentation at T8-2 and L1.  The residual vertebral body height at T8 is diminished compared with prior study.  There is slight posterior bowing of the vertebral body without canal compromise, not significantly changed. The vertebral body height at L1 is maintained.  Methylmethacrylate is again noted within the T12-L1 disc space.  Retropulsed bone along the inferior aspect of L1 is stable without significant canal compromise.  Imaging the paraspinal soft tissues is unremarkable.  The metastasis along the left aspect of the T4 vertebral body is slightly larger than on the prior study.  The precontrast T1 shortening has increased throughout the normal marrow spaces.  This may result of interval spinal radiation.  A new superior endplate fractures are present at T7 and also at T10 without significant retropulsion of bone.  The extent of dural enhancement  is significantly diminished.  This is now evident within the ventral aspect of the canal only from T7-T9 and posteriorly from T7-8 through T8-9.  A slight superior endplate fracture is present at T9 without significant loss of disc height.  An inferior plate Schmorl's node at T11 is stable.  No significant disc herniation or stenosis is present.  Minimal osseous foraminal narrowing is present on the right at T8-T9 and T9- 10, new from prior exam.  Postcontrast images demonstrate enhancement associated with the new fractures at T7, T9, and T10.  More diffuse fracture at T7  suggests a pathologic fracture.  It is not clear that the T9 and T10 fractures are pathologic.  IMPRESSION:  1.  Interval vertebral augmentation at T8 and L1.  There is some loss of height at T8 relative to the prior study without increased retropulsion of bone. 2.  New superior endplate compression fractures at T7, T9, and T10. Loss of height is most significant at T7.  There is no significant retropulsion of bone or central canal compromise at these levels. 3.  Minimal right foraminal narrowing at T8-9 and T9-10 is new. 4.  Significant reduction in the extent of dural enhancement now extending from T7-T9 as described. 5.  Slight increase in size of a left-sided osseous metastasis at T4.   Original Report Authenticated By: Jamesetta Orleans. MATTERN, M.D.      No diagnosis found.    MDM  The patient presents with chills, fever but no other symptoms.  As she is on an oral chemotherapeutic agent, febrile workup was obtained.  This reveals only findings suggestive of a uti.  She will be prescribed cipro.  Blood cultures and urine culture have been obtained and are pending.  She is to return as needed if she worsens.        Geoffery Lyons, MD 09/06/11 2036

## 2011-09-06 NOTE — ED Notes (Signed)
Pt has hx of lung ca- just finished 6 weeks of steroid therapy- c/o diarrhea, fever and chills

## 2011-09-06 NOTE — Telephone Encounter (Signed)
I tried to call [name] in regards to setting up the KP, Ablation, BX.  I did not get an answer.  I will try to call again later.

## 2011-09-06 NOTE — ED Notes (Signed)
Patient ambulatory to the restroom with standby assistance to obtain urine sample for UA.

## 2011-09-06 NOTE — Telephone Encounter (Signed)
I tried to call Mrs. Shimada again.  I left a vm on the home phone.

## 2011-09-07 ENCOUNTER — Encounter (HOSPITAL_COMMUNITY): Payer: Self-pay | Admitting: Pharmacy Technician

## 2011-09-07 ENCOUNTER — Other Ambulatory Visit (HOSPITAL_COMMUNITY): Payer: Self-pay | Admitting: Interventional Radiology

## 2011-09-07 DIAGNOSIS — IMO0002 Reserved for concepts with insufficient information to code with codable children: Secondary | ICD-10-CM

## 2011-09-07 DIAGNOSIS — C3490 Malignant neoplasm of unspecified part of unspecified bronchus or lung: Secondary | ICD-10-CM

## 2011-09-07 LAB — URINE CULTURE
Colony Count: >=100000
Special Requests: NORMAL

## 2011-09-08 ENCOUNTER — Encounter (HOSPITAL_COMMUNITY): Payer: Self-pay | Admitting: Pharmacy Technician

## 2011-09-08 ENCOUNTER — Other Ambulatory Visit: Payer: Self-pay | Admitting: Physician Assistant

## 2011-09-11 ENCOUNTER — Ambulatory Visit (HOSPITAL_BASED_OUTPATIENT_CLINIC_OR_DEPARTMENT_OTHER): Payer: BC Managed Care – PPO

## 2011-09-11 ENCOUNTER — Encounter (HOSPITAL_COMMUNITY): Payer: Self-pay

## 2011-09-11 ENCOUNTER — Encounter (HOSPITAL_COMMUNITY)
Admission: RE | Admit: 2011-09-11 | Discharge: 2011-09-11 | Disposition: A | Discharge status: Home / Self Care | Payer: BC Managed Care – PPO | Source: Ambulatory Visit | Attending: Hematology & Oncology | Admitting: Hematology & Oncology

## 2011-09-11 DIAGNOSIS — M948X9 Other specified disorders of cartilage, unspecified sites: Secondary | ICD-10-CM | POA: Insufficient documentation

## 2011-09-11 DIAGNOSIS — C3490 Malignant neoplasm of unspecified part of unspecified bronchus or lung: Secondary | ICD-10-CM

## 2011-09-11 DIAGNOSIS — C7951 Secondary malignant neoplasm of bone: Secondary | ICD-10-CM | POA: Insufficient documentation

## 2011-09-11 DIAGNOSIS — C7952 Secondary malignant neoplasm of bone marrow: Secondary | ICD-10-CM | POA: Insufficient documentation

## 2011-09-11 MED ORDER — TECHNETIUM TC 99M MEDRONATE IV KIT
25.1000 | PACK | Freq: Once | INTRAVENOUS | Status: AC | PRN
  Administered 2011-09-11: 25.1 via INTRAVENOUS

## 2011-09-11 MED ORDER — IOHEXOL 300 MG/ML  SOLN
100.0000 mL | Freq: Once | INTRAMUSCULAR | Status: AC | PRN
  Administered 2011-09-11: 100 mL via INTRAVENOUS

## 2011-09-11 NOTE — Pre-Procedure Instructions (Addendum)
20 HOLDEN DRAUGHON  09/11/2011   Your procedure is scheduled on:  Friday, August 30th.   Report to [facility] at 8:30 AM.  Call this number if you have problems the morning of surgery: [phone]   Remember:   Do not eat food or anything to drink:After Midnight.      Take these medicines the morning of surgery with A SIP OF WATER: , Doxcycline (Vibra-tabs), Medroxyprogestrone (Provera), Tarceva.  May take Alprazolam (Xanax), Hydrocodone- Acetaminophen (Vicodan), Loperamide (Imodium), Promethazine (Phenergan) and Pseudospherine (Sudafed) if needed. only    Do not wear jewelry, make-up or nail polish.  Do not wear lotions, powders, or perfumes. You may wear deodorant.  Do not shave 48 hours prior to surgery. Men may shave face and neck.  Do not bring valuables to the hospital.  Contacts, dentures or bridgework may not be worn into surgery.  Leave suitcase in the car. After surgery it may be brought to your room.  For patients admitted to the hospital, checkout time is 11:00 AM the day of discharge.   Patients discharged the day of surgery will not be allowed to drive home.  Name and phone number of your driver: _rollins spouse [phone]_______________   Special Instructions: CHG Shower Use Special Wash: 1/2 bottle night before surgery and 1/2 bottle morning of surgery.   Please read over the following fact sheets that you were given: Pain Booklet, Coughing and Deep Breathing and Surgical Site Infection Prevention

## 2011-09-12 ENCOUNTER — Encounter (HOSPITAL_COMMUNITY): Payer: Self-pay

## 2011-09-12 ENCOUNTER — Encounter (HOSPITAL_COMMUNITY)
Admission: RE | Admit: 2011-09-12 | Discharge: 2011-09-12 | Disposition: A | Discharge status: Home / Self Care | Payer: BC Managed Care – PPO | Source: Ambulatory Visit | Attending: Interventional Radiology | Admitting: Interventional Radiology

## 2011-09-12 HISTORY — DX: Headache: R51

## 2011-09-12 LAB — BASIC METABOLIC PANEL
BUN: 9 mg/dL (ref 6–23)
Creatinine, Ser: 0.7 mg/dL (ref 0.50–1.10)
GFR calc Af Amer: >90 mL/min (ref >90)
GFR calc non Af Amer: >90 mL/min (ref >90)
Potassium: 3.7 mEq/L (ref 3.5–5.1)

## 2011-09-12 LAB — CBC
Hemoglobin: 12.8 g/dL (ref 12.0–15.0)
MCHC: 34.1 g/dL (ref 30.0–36.0)
RDW: 21.6 % — ABNORMAL HIGH (ref 11.5–15.5)
WBC: 7.1 10*3/uL (ref 4.0–10.5)

## 2011-09-12 LAB — PROTIME-INR
INR: 1.05 (ref 0.00–1.49)
Prothrombin Time: 13.9 seconds (ref 11.6–15.2)

## 2011-09-12 LAB — APTT: aPTT: 28 seconds (ref 24–37)

## 2011-09-13 ENCOUNTER — Other Ambulatory Visit: Payer: Self-pay | Admitting: Radiology

## 2011-09-13 LAB — CULTURE, BLOOD (ROUTINE X 2)
Culture: NO GROWTH
Culture: NO GROWTH

## 2011-09-14 NOTE — Progress Notes (Signed)
1710....Marland KitchenMarland KitchenThursday....Marland Kitchensurgical time to start has been changed to 8:00.Marland KitchenMarland KitchenI instructed pt to be here at 0600....she understands and re-confirms...Marland KitchenDA

## 2011-09-14 NOTE — Progress Notes (Signed)
Message left for time change... Arrival time 0700   Surgery time   0900   Pt's home phone # [phone]

## 2011-09-15 ENCOUNTER — Ambulatory Visit (HOSPITAL_COMMUNITY): Payer: BC Managed Care – PPO | Admitting: Anesthesiology

## 2011-09-15 ENCOUNTER — Encounter (HOSPITAL_COMMUNITY): Payer: Self-pay

## 2011-09-15 ENCOUNTER — Encounter (HOSPITAL_COMMUNITY): Payer: Self-pay | Admitting: *Deleted

## 2011-09-15 ENCOUNTER — Encounter (HOSPITAL_COMMUNITY): Payer: Self-pay | Admitting: Anesthesiology

## 2011-09-15 ENCOUNTER — Encounter (HOSPITAL_COMMUNITY)
Admission: RE | Disposition: A | Discharge status: Home / Self Care | Payer: Self-pay | Source: Ambulatory Visit | Attending: Interventional Radiology

## 2011-09-15 ENCOUNTER — Ambulatory Visit (HOSPITAL_COMMUNITY)
Admission: RE | Admit: 2011-09-15 | Discharge: 2011-09-15 | Disposition: A | Discharge status: Home / Self Care | Payer: BC Managed Care – PPO | Source: Ambulatory Visit | Attending: Interventional Radiology | Admitting: Interventional Radiology

## 2011-09-15 VITALS — BP 110/70 | HR 90 | Temp 96.9°F | Resp 18

## 2011-09-15 DIAGNOSIS — C3490 Malignant neoplasm of unspecified part of unspecified bronchus or lung: Secondary | ICD-10-CM | POA: Insufficient documentation

## 2011-09-15 DIAGNOSIS — C7952 Secondary malignant neoplasm of bone marrow: Secondary | ICD-10-CM | POA: Insufficient documentation

## 2011-09-15 DIAGNOSIS — M8448XA Pathological fracture, other site, initial encounter for fracture: Secondary | ICD-10-CM | POA: Insufficient documentation

## 2011-09-15 DIAGNOSIS — C7951 Secondary malignant neoplasm of bone: Secondary | ICD-10-CM | POA: Insufficient documentation

## 2011-09-15 DIAGNOSIS — IMO0002 Reserved for concepts with insufficient information to code with codable children: Secondary | ICD-10-CM

## 2011-09-15 DIAGNOSIS — Z01812 Encounter for preprocedural laboratory examination: Secondary | ICD-10-CM | POA: Insufficient documentation

## 2011-09-15 SURGERY — RADIOLOGY WITH ANESTHESIA
Anesthesia: General

## 2011-09-15 MED ORDER — FENTANYL CITRATE 0.05 MG/ML IJ SOLN: 50.0000 ug | INTRAMUSCULAR | Status: DC | PRN

## 2011-09-15 MED ORDER — LIDOCAINE HCL (CARDIAC) 20 MG/ML IV SOLN
INTRAVENOUS | Status: DC | PRN
  Administered 2011-09-15: 100 mg via INTRAVENOUS

## 2011-09-15 MED ORDER — EPHEDRINE SULFATE 50 MG/ML IJ SOLN
INTRAMUSCULAR | Status: DC | PRN
  Administered 2011-09-15: 10 mg via INTRAVENOUS

## 2011-09-15 MED ORDER — SUFENTANIL CITRATE 50 MCG/ML IV SOLN
INTRAVENOUS | Status: DC | PRN
  Administered 2011-09-15: 15 ug via INTRAVENOUS

## 2011-09-15 MED ORDER — SODIUM CHLORIDE 0.9 % IV SOLN: INTRAVENOUS | Status: DC

## 2011-09-15 MED ORDER — MUPIROCIN 2 % EX OINT
TOPICAL_OINTMENT | Freq: Once | CUTANEOUS | Status: AC
  Administered 2011-09-15: 1 via NASAL

## 2011-09-15 MED ORDER — TOBRAMYCIN SULFATE 1.2 G IJ SOLR
INTRAMUSCULAR | Status: AC
  Filled 2011-09-15: qty 1.2

## 2011-09-15 MED ORDER — ROCURONIUM BROMIDE 100 MG/10ML IV SOLN
INTRAVENOUS | Status: DC | PRN
  Administered 2011-09-15: 50 mg via INTRAVENOUS

## 2011-09-15 MED ORDER — CEFAZOLIN SODIUM-DEXTROSE 2-3 GM-% IV SOLR
INTRAVENOUS | Status: AC
  Administered 2011-09-15: 2 g via INTRAVENOUS
  Filled 2011-09-15: qty 50

## 2011-09-15 MED ORDER — MIDAZOLAM HCL 5 MG/5ML IJ SOLN
INTRAMUSCULAR | Status: DC | PRN
  Administered 2011-09-15: 2 mg via INTRAVENOUS

## 2011-09-15 MED ORDER — SODIUM CHLORIDE 0.9 % IV SOLN: INTRAVENOUS | Status: AC

## 2011-09-15 MED ORDER — DROPERIDOL 2.5 MG/ML IJ SOLN
INTRAMUSCULAR | Status: DC | PRN
  Administered 2011-09-15: 0.625 mg via INTRAVENOUS

## 2011-09-15 MED ORDER — GLYCOPYRROLATE 0.2 MG/ML IJ SOLN
INTRAMUSCULAR | Status: DC | PRN
  Administered 2011-09-15: 0.4 mg via INTRAVENOUS

## 2011-09-15 MED ORDER — CEFAZOLIN SODIUM 1-5 GM-% IV SOLN: 1.0000 g | INTRAVENOUS | Status: DC

## 2011-09-15 MED ORDER — CEFAZOLIN SODIUM-DEXTROSE 2-3 GM-% IV SOLR
2.0000 g | INTRAVENOUS | Status: DC
  Filled 2011-09-15: qty 50

## 2011-09-15 MED ORDER — HYDROMORPHONE HCL PF 1 MG/ML IJ SOLN: 0.2500 mg | INTRAMUSCULAR | Status: DC | PRN

## 2011-09-15 MED ORDER — PROPOFOL 10 MG/ML IV BOLUS
INTRAVENOUS | Status: DC | PRN
  Administered 2011-09-15: 180 mg via INTRAVENOUS

## 2011-09-15 MED ORDER — LACTATED RINGERS IV SOLN
INTRAVENOUS | Status: DC | PRN
  Administered 2011-09-15 (×2): via INTRAVENOUS

## 2011-09-15 MED ORDER — NEOSTIGMINE METHYLSULFATE 1 MG/ML IJ SOLN
INTRAMUSCULAR | Status: DC | PRN
  Administered 2011-09-15: 3 mg via INTRAVENOUS

## 2011-09-15 MED ORDER — LIDOCAINE HCL 4 % MT SOLN
OROMUCOSAL | Status: DC | PRN
  Administered 2011-09-15: 4 mL via TOPICAL

## 2011-09-15 MED ORDER — DEXAMETHASONE SODIUM PHOSPHATE 4 MG/ML IJ SOLN
INTRAMUSCULAR | Status: DC | PRN
  Administered 2011-09-15: 4 mg via INTRAVENOUS

## 2011-09-15 MED ORDER — PROMETHAZINE HCL 25 MG/ML IJ SOLN: 6.2500 mg | INTRAMUSCULAR | Status: DC | PRN

## 2011-09-15 MED ORDER — ONDANSETRON HCL 4 MG/2ML IJ SOLN
INTRAMUSCULAR | Status: DC | PRN
  Administered 2011-09-15: 4 mg via INTRAVENOUS

## 2011-09-15 MED ORDER — MIDAZOLAM HCL 2 MG/2ML IJ SOLN: 1.0000 mg | INTRAMUSCULAR | Status: DC | PRN

## 2011-09-15 MED ORDER — MUPIROCIN 2 % EX OINT
TOPICAL_OINTMENT | CUTANEOUS | Status: AC
  Administered 2011-09-15: 1 via NASAL
  Filled 2011-09-15: qty 22

## 2011-09-15 NOTE — Procedures (Signed)
S/P T7,T9 and T 10  Tumor ablation followed by augmentation  With  Methyl  methacrylate .

## 2011-09-15 NOTE — Anesthesia Preprocedure Evaluation (Addendum)
Anesthesia Evaluation  Patient identified by MRN, date of birth, ID band Patient awake    Reviewed: Allergy & Precautions, H&P , NPO status , Patient's Chart, lab work & pertinent test results  Airway Mallampati: I TM Distance: >3 FB Neck ROM: Full    Dental   Pulmonary  CA lung   Pulmonary exam normal       Cardiovascular     Neuro/Psych  Headaches,    GI/Hepatic   Endo/Other    Renal/GU      Musculoskeletal   Abdominal   Peds  Hematology   Anesthesia Other Findings   Reproductive/Obstetrics                         Anesthesia Physical Anesthesia Plan  ASA: II  Anesthesia Plan: General   Post-op Pain Management:    Induction: Intravenous  Airway Management Planned: Oral ETT  Additional Equipment:   Intra-op Plan:   Post-operative Plan: Extubation in OR  Informed Consent: I have reviewed the patients History and Physical, chart, labs and discussed the procedure including the risks, benefits and alternatives for the proposed anesthesia with the patient or authorized representative who has indicated his/her understanding and acceptance.     Plan Discussed with: CRNA and Surgeon  Anesthesia Plan Comments:         Anesthesia Quick Evaluation

## 2011-09-15 NOTE — Transfer of Care (Signed)
Immediate Anesthesia Transfer of Care Note  Patient: [name]  Procedure(s) Performed: Procedure(s) (LRB): RADIOLOGY WITH ANESTHESIA (N/A)  Patient Location: PACU  Anesthesia Type: General  Level of Consciousness: oriented, sedated, patient cooperative and responds to stimulation  Airway & Oxygen Therapy: Patient Spontanous Breathing and Patient connected to nasal cannula oxygen  Post-op Assessment: Report given to PACU RN, Post -op Vital signs reviewed and stable, Patient moving all extremities X 4 and Patient able to stick tongue midline  Post vital signs: Reviewed and stable  Complications: No apparent anesthesia complications

## 2011-09-15 NOTE — H&P (Signed)
Chief Complaint: Back pain from metastatic compression fractures Referring Physician:Ennever HPI: [name] is an 56 y.o. female with metastatic disease to her back and required co-ablation procedure back in June for several vertebral levels. She unfortunately has recurrent back pain and new MRI finds evidence of disease with pathologic compression fractures at levels T7, T9, and T10 She is scheduled for another co-ablation procedure today. She had a UTI  About 2 weeks ago and completed her abx course and has no residual sxs. She otherwise denies any changes to medical history or medications.  Past Medical History:  Past Medical History  Diagnosis Date  . Pulmonary nodules 06/22/11    per CT scan  . IBS (irritable bowel syndrome)   . Fibroadenoma of breast     hx of  . Metastasis of unknown origin   . Hx of radiation therapy 07/05/11 -07/21/11    LS, Tspine  . Hx of radiation therapy 07/19/11    brain, SRS  . Adenocarcinoma of lung   . 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 Headache     Past Surgical History:  Past Surgical History  Procedure Date  . Lung biopsy   . Vertebroplasty     Family History:  Family History  Problem Relation Age of Onset  . Cancer Mother     cervical  . Asthma Father     Social History:  reports that she has never smoked. She has never used smokeless tobacco. She reports that she does not drink alcohol or use illicit drugs.  Allergies: No Known Allergies  Medications: ALPRAZolam (XANAX) 0.25 MG tablet Sig - Route: Take 0.25-0.5 mg by mouth every 8 (eight) hours as needed. For anxiety. - Oral Class: Historical Med Number of times this order has been changed since signing: 1 Order [address] ciprofloxacin (CIPRO) 500 MG tablet 09/06/2011 09/11/2011 Sig - Route: Take 500 mg by mouth 2 (two) times daily. - Oral Class: Historical Med doxycycline (VIBRA-TABS) 100 MG tablet 08/13/2011 Sig - Route: Take 100 mg by  mouth Twice daily. - Oral Class: Historical Med Number of times this order has been changed since signing: 1 Order [address] estradiol (VIVELLE-DOT) 0.0375 MG/24HR Sig - Route: Place 1 patch onto the skin 2 (two) times a week. Apply on Tuesday and on Friday - Transdermal Class: Historical Med Number of times this order has been changed since signing: 2 Order [address] HYDROcodone-acetaminophen (NORCO/VICODIN) 5-325 MG per tablet Sig - Route: Take 1 tablet by mouth every 6 (six) hours as needed. For pAin - Oral Class: Historical Med hydrocortisone cream 0.5 % 60 g 6 07/17/2011 Sig: Like to skin at bedtime every night starting the day before Tarceva. Number of times this order has been changed since signing: 1 Order [address] ibuprofen (ADVIL,MOTRIN) 200 MG tablet Sig - Route: Take 400 mg by mouth every 6 (six) hours as needed. For pain in back. - Oral Class: Historical Med Number of times this order has been changed since signing: 2 Order [address] loperamide (IMODIUM) 2 MG capsule Sig - Route: Take 2 mg by mouth 4 (four) times daily as needed. For diarhea - Oral Class: Historical Med medroxyPROGESTERone (PROVERA) 5 MG tablet Sig - Route: Take 2.5 mg by mouth daily. - Oral Class: Historical Med promethazine (PHENERGAN) 12.5 MG tablet 07/03/2011 Sig - Route: Take 12.5 mg by mouth daily as needed. for nausea - Oral Class: Historical Med Number of times this order  has been changed since signing: 1 Order [address] pseudoephedrine (SUDAFED) 30 MG tablet Sig - Route: Take 30 mg by mouth every 4 (four) hours as needed. For allergies - Oral Class: Historical Med Skin Protectants, Misc. (EUCERIN) cream Sig - Route: Apply 1 application topically every morning. - Topical Class: Historical Med TARCEVA 150 MG tablet 08/14/2011 Sig - Route: Take 150 mg by mouth Daily. - Oral Class: Historical Med Number of times this order has been changed since signing: 1 Order [address] zolpidem (AMBIEN) 10 MG tablet 07/28/2011 Sig -  Route: Take 10 mg by mouth daily as needed. for sleep - Oral Class: Historical Med   Please HPI for pertinent positives, otherwise complete 10 system ROS negative.  Physical Exam: There were no vitals taken for this visit. There is no height or weight on file to calculate BMI.   General Appearance:  Alert, cooperative, no distress, appears stated age  Head:  Normocephalic, without obvious abnormality, atraumatic  ENT: Unremarkable  Neck: Supple, symmetrical, trachea midline, no adenopathy, thyroid: not enlarged, symmetric, no tenderness/mass/nodules  Lungs:   Clear to auscultation bilaterally, no w/r/r, respirations unlabored without use of accessory muscles.  Chest Wall:  No tenderness or deformity  Heart:  Regular rate and rhythm, S1, S2 normal, no murmur, rub or gallop. Carotids 2+ without bruit.  Abdomen:   Soft, non-tender, non distended. Bowel sounds active all four quadrants,  no masses, no organomegaly.  Neurologic: Normal affect, no gross deficits.   Labs: CBC    Component Value Date/Time   WBC 7.1 09/12/2011 0922   WBC 8.0 08/18/2011 1346   RBC 4.39 09/12/2011 0922   HGB 12.8 09/12/2011 0922   HGB 13.6 08/18/2011 1346   HCT 37.5 09/12/2011 0922   HCT 38.7 08/18/2011 1346   PLT 470* 09/12/2011 0922   BMET    Component Value Date/Time   NA 143 09/12/2011 0922   K 3.7 09/12/2011 0922   CL 106 09/12/2011 0922   CO2 28 09/12/2011 0922   GLUCOSE 82 09/12/2011 0922   BUN 9 09/12/2011 0922   CREATININE 0.70 09/12/2011 0922   CALCIUM 8.7 09/12/2011 0922   GFRNONAA >90 09/12/2011 0922   GFRAA >90 09/12/2011 0922    Prothrombin Time/INR   PTT 13.9/1.05     28   Assessment/Plan Pathologic compression fractures of T7, T9, 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 Planned co-ablation/augmentation procedure today with general anesthesia. Labs reviewed. Consent signed  Brayton El PA-C 09/15/2011, 8:07 AM

## 2011-09-15 NOTE — Progress Notes (Signed)
One diamond ring removed but could not get wedding band off. Dr. Gypsy Balsam notified and okay with that.

## 2011-09-15 NOTE — Anesthesia Postprocedure Evaluation (Signed)
  Anesthesia Post-op Note  Patient: [name]  Procedure(s) Performed: Procedure(s) (LRB): RADIOLOGY WITH ANESTHESIA (N/A)  Patient Location: PACU  Anesthesia Type: General  Level of Consciousness: awake  Airway and Oxygen Therapy: Patient Spontanous Breathing  Post-op Pain: mild  Post-op Assessment: Post-op Vital signs reviewed, Patient's Cardiovascular Status Stable, Respiratory Function Stable, Patent Airway, No signs of Nausea or vomiting and Pain level controlled  Post-op Vital Signs: stable  Complications: No apparent anesthesia complications

## 2011-09-19 ENCOUNTER — Ambulatory Visit (HOSPITAL_BASED_OUTPATIENT_CLINIC_OR_DEPARTMENT_OTHER): Payer: BC Managed Care – PPO | Admitting: Hematology & Oncology

## 2011-09-19 ENCOUNTER — Other Ambulatory Visit (HOSPITAL_COMMUNITY): Payer: Self-pay | Admitting: Interventional Radiology

## 2011-09-19 ENCOUNTER — Ambulatory Visit (HOSPITAL_BASED_OUTPATIENT_CLINIC_OR_DEPARTMENT_OTHER): Payer: BC Managed Care – PPO

## 2011-09-19 ENCOUNTER — Other Ambulatory Visit (HOSPITAL_BASED_OUTPATIENT_CLINIC_OR_DEPARTMENT_OTHER): Payer: BC Managed Care – PPO | Admitting: Lab

## 2011-09-19 VITALS — BP 120/80 | HR 107 | Temp 98.0°F | Resp 18 | Ht 62.0 in | Wt 160.0 lb

## 2011-09-19 DIAGNOSIS — IMO0002 Reserved for concepts with insufficient information to code with codable children: Secondary | ICD-10-CM

## 2011-09-19 DIAGNOSIS — C3430 Malignant neoplasm of lower lobe, unspecified bronchus or lung: Secondary | ICD-10-CM

## 2011-09-19 DIAGNOSIS — C7951 Secondary malignant neoplasm of bone: Secondary | ICD-10-CM

## 2011-09-19 DIAGNOSIS — C3490 Malignant neoplasm of unspecified part of unspecified bronchus or lung: Secondary | ICD-10-CM

## 2011-09-19 DIAGNOSIS — IMO0001 Reserved for inherently not codable concepts without codable children: Secondary | ICD-10-CM

## 2011-09-19 LAB — COMPREHENSIVE METABOLIC PANEL
ALT: 39 U/L — ABNORMAL HIGH (ref 0–35)
AST: 43 U/L — ABNORMAL HIGH (ref 0–37)
Albumin: 2.6 g/dL — ABNORMAL LOW (ref 3.5–5.2)
CO2: 29 mEq/L (ref 19–32)
Calcium: 8.2 mg/dL — ABNORMAL LOW (ref 8.4–10.5)
Chloride: 106 mEq/L (ref 96–112)
Potassium: 3.8 mEq/L (ref 3.5–5.3)
Sodium: 142 mEq/L (ref 135–145)
Total Protein: 4.7 g/dL — ABNORMAL LOW (ref 6.0–8.3)

## 2011-09-19 LAB — CBC WITH DIFFERENTIAL (CANCER CENTER ONLY)
BASO%: 0.7 % (ref 0.0–2.0)
EOS%: 2.1 % (ref 0.0–7.0)
LYMPH#: 1 10*3/uL (ref 0.9–3.3)
MCHC: 33.9 g/dL (ref 32.0–36.0)
MONO#: 0.7 10*3/uL (ref 0.1–0.9)
NEUT#: 5.2 10*3/uL (ref 1.5–6.5)
RDW: 23.1 % — ABNORMAL HIGH (ref 11.1–15.7)
WBC: 7.2 10*3/uL (ref 3.9–10.0)

## 2011-09-19 MED ORDER — TRIAMTERENE-HCTZ 37.5-25 MG PO TABS: ORAL_TABLET | ORAL | Status: DC

## 2011-09-19 MED ORDER — ERLOTINIB HCL 25 MG PO TABS: 25.0000 mg | ORAL_TABLET | Freq: Every day | ORAL | Status: AC

## 2011-09-19 MED ORDER — ERLOTINIB HCL 100 MG PO TABS: 100.0000 mg | ORAL_TABLET | Freq: Every day | ORAL | Status: DC

## 2011-09-19 MED ORDER — ZOLEDRONIC ACID 4 MG/100ML IV SOLN
4.0000 mg | Freq: Once | INTRAVENOUS | Status: AC
  Administered 2011-09-19: 4 mg via INTRAVENOUS
  Filled 2011-09-19: qty 100

## 2011-09-19 NOTE — Progress Notes (Signed)
This office note has been dictated.

## 2011-09-19 NOTE — Patient Instructions (Signed)
Zoledronic Acid injection (Hypercalcemia, Oncology) What is this medicine? ZOLEDRONIC ACID (ZOE le dron ik AS id) lowers the amount of calcium loss from bone. It is used to treat too much calcium in your blood from cancer. It is also used to prevent complications of cancer that has spread to the bone. This medicine may be used for other purposes; ask your health care provider or pharmacist if you have questions. What should I tell my health care provider before I take this medicine? They need to know if you have any of these conditions: -aspirin-sensitive asthma -dental disease -kidney disease -an unusual or allergic reaction to zoledronic acid, other medicines, foods, dyes, or preservatives -pregnant or trying to get pregnant -breast-feeding How should I use this medicine? This medicine is for infusion into a vein. It is given by a health care professional in a hospital or clinic setting. Talk to your pediatrician regarding the use of this medicine in children. Special care may be needed. Overdosage: If you think you have taken too much of this medicine contact a poison control center or emergency room at once. NOTE: This medicine is only for you. Do not share this medicine with others. What if I miss a dose? It is important not to miss your dose. Call your doctor or health care professional if you are unable to keep an appointment. What may interact with this medicine? -certain antibiotics given by injection -NSAIDs, medicines for pain and inflammation, like ibuprofen or naproxen -some diuretics like bumetanide, furosemide -teriparatide -thalidomide This list may not describe all possible interactions. Give your health care provider a list of all the medicines, herbs, non-prescription drugs, or dietary supplements you use. Also tell them if you smoke, drink alcohol, or use illegal drugs. Some items may interact with your medicine. What should I watch for while using this medicine? Visit  your doctor or health care professional for regular checkups. It may be some time before you see the benefit from this medicine. Do not stop taking your medicine unless your doctor tells you to. Your doctor may order blood tests or other tests to see how you are doing. Women should inform their doctor if they wish to become pregnant or think they might be pregnant. There is a potential for serious side effects to an unborn child. Talk to your health care professional or pharmacist for more information. You should make sure that you get enough calcium and vitamin D while you are taking this medicine. Discuss the foods you eat and the vitamins you take with your health care professional. Some people who take this medicine have severe bone, joint, and/or muscle pain. This medicine may also increase your risk for a broken thigh bone. Tell your doctor right away if you have pain in your upper leg or groin. Tell your doctor if you have any pain that does not go away or that gets worse. What side effects may I notice from receiving this medicine? Side effects that you should report to your doctor or health care professional as soon as possible: -allergic reactions like skin rash, itching or hives, swelling of the face, lips, or tongue -anxiety, confusion, or depression -breathing problems -changes in vision -feeling faint or lightheaded, falls -jaw burning, cramping, pain -muscle cramps, stiffness, or weakness -trouble passing urine or change in the amount of urine Side effects that usually do not require medical attention (report to your doctor or health care professional if they continue or are bothersome): -bone, joint, or muscle pain -  fever -hair loss -irritation at site where injected -loss of appetite -nausea, vomiting -stomach upset -tired This list may not describe all possible side effects. Call your doctor for medical advice about side effects. You may report side effects to FDA at  1-800-FDA-1088. Where should I keep my medicine? This drug is given in a hospital or clinic and will not be stored at home. NOTE: This sheet is a summary. It may not cover all possible information. If you have questions about this medicine, talk to your doctor, pharmacist, or health care provider.  2012, Elsevier/Gold Standard. (07/01/2010 9:06:58 AM) 

## 2011-09-20 NOTE — Progress Notes (Signed)
CC:   [name], M.D. Pollyann Savoy, M.D.  DIAGNOSIS:  Metastatic adenocarcinoma of the lung, EGFR positive.  CURRENT THERAPY: 1. Tarceva 125 mg p.o. daily. 2. Zometa 4 mg IV q.month.  INTERIM HISTORY:  Ms. Bratcher comes in for followup.  She has responded incredibly well to the Tarceva.  She had a CT scan that showed marked response with decrease in pulmonary mets.  She has decrease in her extraosseous disease.  There was noted to be new areas of sclerosis within bilateral ribs with new T4 lesion.  It was unclear as to what these represented.  She was having some more back pain.  She did undergo another kyphoplasty procedure.  This within the thoracic spine.  She is complaining of tingling in the left leg.  This appears to be worse when she stands up.  We may need to get MRI of her lumbar spine to see if there is any evidence of nerve compression.  Again, she has responded incredibly well to treatment.  There is no headache.  She does have some nausea.  She does have some diarrhea.  I am going to cut her Tarceva dose down to 125 mg a day.  She was on 150.  Her skin has not been too affected by the Tarceva.  However, she is having other constitutional symptoms that I want to try to improve.  I did give her some samples of Zofran to try to help with some nausea. The patient has some swelling in her legs.  We may need to try a mild diuretic to help with the swelling.  She has had some amount of emesis.  She is on Compazine for this. Hopefully, the Zofran will help.  PHYSICAL EXAMINATION:  General:  This is a fairly well-developed, well- nourished white female in no obvious distress.  Vital signs:  98, pulse 107, respiratory rate 18, blood pressure 120/80.  Weight is 160.  Head and neck:  Normocephalic, atraumatic skull.  There are no ocular or oral lesions.  There are no palpable cervical or supraclavicular lymph nodes. Lungs:  Clear bilaterally.  Cardiac:  Regular  rate and rhythm with a normal S1, S2.  There are no murmurs, rubs or bruits.  Abdomen:  Soft with good bowel sounds.  There is no palpable abdominal mass.  There is no fluid wave.  There is no palpable hepatosplenomegaly.  Back:  No tenderness over the spine, ribs or hips.  Extremities:  Shows no clubbing, cyanosis.  She does have some trace edema in her lower legs. She has good range of motion of her joints.  She has good pulses in her distal extremities.  Skin:  Shows no rashes.  Neurological:  Shows no focal neurological deficits.  LABORATORY STUDIES:  White cell count is 7.2, hemoglobin 13.3, hematocrit 39.2, platelet count 385.  IMPRESSION:  [name] is a very nice 56 year old white female with metastatic adenocarcinoma of the lung.  She is EGFR positive.  Again, she has responded to Tarceva incredibly well.  I think we do have the "flexibility" to cut her dose back a little bit.  I want to try to get her better situated so she can have a better quality of life.  Again, we will decrease the dose at 125 mg a day.  I told her to take a week off from the Tarceva.  We will go ahead and give her Zometa today.  I will need to get her set up with an MRI of her  lumbar spine to see what this radiculopathy is in the left leg.  I also will give her a mild diuretic (Maxzide) to see if this does not help with her swelling.  I do want to see her back in about 3 weeks' time.  Hopefully, she will be feeling a little better.    ______________________________ Josph Macho, M.D. PRE/MEDQ  D:  09/19/2011  T:  09/19/2011  Job:  [id number]

## 2011-09-21 ENCOUNTER — Ambulatory Visit (HOSPITAL_COMMUNITY)
Admission: RE | Admit: 2011-09-21 | Discharge: 2011-09-21 | Disposition: A | Discharge status: Home / Self Care | Payer: BC Managed Care – PPO | Source: Ambulatory Visit | Attending: Hematology & Oncology | Admitting: Hematology & Oncology

## 2011-09-21 DIAGNOSIS — IMO0002 Reserved for concepts with insufficient information to code with codable children: Secondary | ICD-10-CM | POA: Insufficient documentation

## 2011-09-21 DIAGNOSIS — M79609 Pain in unspecified limb: Secondary | ICD-10-CM | POA: Insufficient documentation

## 2011-09-21 DIAGNOSIS — R209 Unspecified disturbances of skin sensation: Secondary | ICD-10-CM | POA: Insufficient documentation

## 2011-09-21 DIAGNOSIS — C7951 Secondary malignant neoplasm of bone: Secondary | ICD-10-CM | POA: Insufficient documentation

## 2011-09-21 DIAGNOSIS — C3490 Malignant neoplasm of unspecified part of unspecified bronchus or lung: Secondary | ICD-10-CM | POA: Insufficient documentation

## 2011-09-21 MED ORDER — GADOBENATE DIMEGLUMINE 529 MG/ML IV SOLN
15.0000 mL | Freq: Once | INTRAVENOUS | Status: AC | PRN
  Administered 2011-09-21: 15 mL via INTRAVENOUS

## 2011-09-22 ENCOUNTER — Other Ambulatory Visit: Payer: Self-pay | Admitting: Hematology & Oncology

## 2011-09-22 ENCOUNTER — Ambulatory Visit
Admission: RE | Admit: 2011-09-22 | Discharge: 2011-09-22 | Disposition: A | Discharge status: Home / Self Care | Payer: BC Managed Care – PPO | Source: Ambulatory Visit | Attending: Radiation Oncology | Admitting: Radiation Oncology

## 2011-09-22 DIAGNOSIS — C7931 Secondary malignant neoplasm of brain: Secondary | ICD-10-CM

## 2011-09-22 DIAGNOSIS — C7951 Secondary malignant neoplasm of bone: Secondary | ICD-10-CM

## 2011-09-22 MED ORDER — GADOBENATE DIMEGLUMINE 529 MG/ML IV SOLN
15.0000 mL | Freq: Once | INTRAVENOUS | Status: AC | PRN
  Administered 2011-09-22: 15 mL via INTRAVENOUS

## 2011-09-22 MED ORDER — DEXAMETHASONE 4 MG PO TABS: ORAL_TABLET | ORAL | Status: DC

## 2011-09-22 NOTE — Progress Notes (Signed)
I spoke with Ms. [name] today. She had her MRI done. This was of the lumbar spine. 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 This would explain her symptoms.  I spoke with Dr. Karoline Caldwell of radiation oncology. She was to see Ms. [name] on the ninth. She is aware of the situation with her lumbar spine. She will see if Ms. [name] can take radiation to that area.  I sent in a prescription for Decadron for Ms. [name]. I told Ms. [name] to start taking this today. This may help her pain by taking some swelling of the L4 nerve root.  I told Ms. [name] to call me if she had any problems.  She is getting Zometa.  Cindee Lame

## 2011-09-25 ENCOUNTER — Ambulatory Visit
Admission: RE | Admit: 2011-09-25 | Discharge: 2011-09-25 | Disposition: A | Discharge status: Home / Self Care | Payer: BC Managed Care – PPO | Source: Ambulatory Visit | Attending: Neurosurgery | Admitting: Neurosurgery

## 2011-09-25 ENCOUNTER — Other Ambulatory Visit: Payer: Self-pay | Admitting: Radiation Therapy

## 2011-09-25 ENCOUNTER — Encounter: Payer: Self-pay | Admitting: Radiation Oncology

## 2011-09-25 ENCOUNTER — Ambulatory Visit
Admission: RE | Admit: 2011-09-25 | Discharge: 2011-09-25 | Disposition: A | Discharge status: Home / Self Care | Payer: BC Managed Care – PPO | Source: Ambulatory Visit | Attending: Radiation Oncology | Admitting: Radiation Oncology

## 2011-09-25 DIAGNOSIS — C7931 Secondary malignant neoplasm of brain: Secondary | ICD-10-CM

## 2011-09-25 DIAGNOSIS — C3490 Malignant neoplasm of unspecified part of unspecified bronchus or lung: Secondary | ICD-10-CM

## 2011-09-25 NOTE — Progress Notes (Signed)
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 She sought medical oncology 3 days ago and was found based on new scans to have a dramatic response to Tarceva   in her lungs; however the patient complained of a month history of tingling down her left leg to her foot from her hip. She's been started 8 mg of Decadron 3 times a day. She reports that this is somewhat improved her symptoms however she still has some tingling and also has been having some weakness, more in the left leg than the right leg. She reports that when she stands up she has a charley horse in her thigh which is short-lived. This is somewhat improved since starting the Decadron. Other complaints include the fact that she's had a floater and her right thigh since her brain MRI. She reports nausea and vomiting related to Tarceva. She reports pitting edema in her feet. She underwent repeat vertebroplasty to her thoracic spine   at T7 T9  T10. This was on 09/15/2011. Her back pain is fairly well controlled.                    She reports a slight headaches related to starting the Decadron but nothing severe. Brain MRI showed excellent response at all 4 lesions with no new disease in her brain.  ALLERGIES:   has no known allergies.  Meds: Current Outpatient Prescriptions  Medication Sig Dispense Refill  . ALPRAZolam (XANAX) 0.25 MG tablet Take 0.25-0.5 mg by mouth every 8 (eight) hours as needed. For anxiety.      Marland Kitchen dexamethasone (DECADRON) 4 MG tablet Take 5 pills today with  food.  Starting on 9/7, take 2 pills 3x a day with food.  60 tablet  2  . doxycycline (VIBRA-TABS) 100 MG tablet Take 100 mg by mouth Twice daily.      Marland Kitchen erlotinib (TARCEVA) 100 MG tablet Take 1 tablet (100 mg total) by mouth daily.  30 tablet  3  . erlotinib (TARCEVA) 25 MG tablet Take 1 tablet (25 mg total) by mouth daily. Take on an empty stomach 1 hour before meals or 2 hours after.  30 tablet  3  . estradiol (VIVELLE-DOT) 0.0375 MG/24HR Place 1 patch onto the skin 2 (two) times a week. Apply on Tuesday and on Friday      . HYDROcodone-acetaminophen (NORCO/VICODIN) 5-325 MG per tablet Take 1 tablet by mouth every 6 (six) hours as needed. For pAin      . hydrocortisone cream 0.5 % Like to skin at bedtime every night starting the day before Tarceva.  60 g  6  . ibuprofen (ADVIL,MOTRIN) 200 MG tablet Take 400 mg by mouth every 6 (six) hours as needed. For pain in back.      . loperamide (IMODIUM) 2 MG capsule Take 2 mg by mouth 4 (four) times daily as needed. For diarhea      . medroxyPROGESTERone (PROVERA) 5 MG tablet Take 2.5 mg by mouth daily.      . promethazine (  PHENERGAN) 12.5 MG tablet Take 12.5 mg by mouth daily as needed. for nausea      . pseudoephedrine (SUDAFED) 30 MG tablet Take 30 mg by mouth every 4 (four) hours as needed. For allergies      . Skin Protectants, Misc. (EUCERIN) cream Apply 1 application topically every morning.      . triamterene-hydrochlorothiazide (MAXZIDE-25) 37.5-25 MG per tablet Take 1 pill a day, IF NEEDED, for leg swelling  30 tablet  3  . zolpidem (AMBIEN) 10 MG tablet Take 10 mg by mouth daily as needed. for sleep        Physical Findings: The patient is in no acute distress. Patient is alert and oriented. Pressure 122/78, pulse 89, temperature 97.4, respiratory rate 18, weight 162 pounds.  No significant changes. Sitting comfortably in a chair. Extraocular movements are intact. Pupils are equally round and reactive to light. Oropharynx is clear with no  thrush. Left patellar reflex less brisk than the right. Fairly good strength bilaterally in the lower extremities. Pitting edema bilaterally in the feet. Speech is fluent. Judgment and insight are intact. She ambulates independently.  Lab Findings: Lab Results  Component Value Date   WBC 7.2 09/19/2011   HGB 13.3 09/19/2011   HCT 39.2 09/19/2011   MCV 88 09/19/2011   PLT 385 09/19/2011    CMP     Component Value Date/Time   NA 142 09/19/2011 0924   K 3.8 09/19/2011 0924   CL 106 09/19/2011 0924   CO2 29 09/19/2011 0924   GLUCOSE 88 09/19/2011 0924   BUN 9 09/19/2011 0924   CREATININE 0.70 09/19/2011 0924   CALCIUM 8.2* 09/19/2011 0924   PROT 4.7* 09/19/2011 0924   ALBUMIN 2.6* 09/19/2011 0924   AST 43* 09/19/2011 0924   ALT 39* 09/19/2011 0924   ALKPHOS 229* 09/19/2011 0924   BILITOT 1.2 09/19/2011 0924   GFRNONAA >90 09/12/2011 0922   GFRAA >90 09/12/2011 0922      Radiographic Findings: Dg Chest 2 View  09/06/2011  *RADIOLOGY REPORT*  Clinical Data: Chest pain, chills and fever.  CHEST - 2 VIEW  Comparison: CT chest 07/04/2011 and chest radiograph 07/03/2011.  Findings: Trachea is midline.  Heart size stable.  Lungs are somewhat low in volume with linear densities in the mid and lower lung zones.  Previously seen bilateral pulmonary nodules are less prominent than on the examination of 07/03/2011.  No pleural fluid. T8 and L1 vertebroplasties.  There may be subtle loss of height involving the superior endplate of T7.  IMPRESSION:  1.  Interval improvement in pulmonary metastatic disease when compared 07/03/2011.  Scattered atelectasis or scarring. 2.  Possible slight compression of the superior endplate of T7, new.   Original Report Authenticated By: Reyes Ivan, M.D.    Ct Chest W Contrast  09/11/2011  *RADIOLOGY REPORT*  Clinical Data:  Metastatic lung cancer.  Assess response to chemotherapy.  CT CHEST, ABDOMEN AND PELVIS WITH CONTRAST  Technique: Contiguous axial images of the chest abdomen and pelvis  were obtained after IV contrast administration.  Contrast: 100  ml Omnipaque-300  Comparison: Plain film chest 09/06/2011.  Prior chest CT of 07/04/2011. Chest, abdomen, pelvic CT 06/22/2011 (to which all measurement comparisons will be made)  CT CHEST  Findings: Lung windows demonstrate marked improvement in pulmonary metastatic burden.  The vast majority of pulmonary nodules have resolved.  A dominant left lower lobe lung mass has been replaced by an area of soft tissue density which is  primarily felt to represent scarring.  This measures 1.5 x 1.8 cm on image 33. Similarly, superior segment left lower lobe, 8 mm soft tissue density which is primarily felt to represent scarring is at the site of a 1.6 cm nodule on the prior exam.  No new nodules are identified.  Soft tissue windows demonstrate resolution of left supraclavicular adenopathy.  Borderline cardiomegaly, without pericardial effusion. There is trace left pleural fluid, decreased from 07/04/2011.  No right-sided pleural effusion. No central pulmonary embolism, on this non-dedicated study.  Resolved mediastinal adenopathy.  A precarinal node measures 5 mm on image 17. No hilar adenopathy.  Interval sclerosis at the site of a posterior left fifth rib expansile lytic lesion on the prior exam; image 14 today.  New sclerosis involving the third anterior right rib at the site of a lytic lesion on the prior.  Image 23 today.  New sclerotic lesion involving the T4 vertebral body and C7 vertebral body.  Vertebral augmentation at T8; the site of a moderate compression deformity.  There is mild superior endplate compression deformity at T10 which is new since 06/22/2011.  IMPRESSION: 1. Since 06/22/2011, marked response to therapy of extra osseous metastatic disease throughout the chest. 2.  No new or progressive extra osseous disease. 3.  Decreased trace left pleural fluid. 4.  New areas of sclerosis within bilateral ribs with new T4 sclerotic lesion and mild T10  compression deformity.  The areas of rib sclerosis could represent healing of metastasis or progressive disease.  CT ABDOMEN AND PELVIS  Findings:  Focal steatosis adjacent the falciform ligament. Resolution of the previously described hepatic dome lesion.  Normal spleen, stomach, pancreas, gallbladder, biliary tract, adrenal glands, kidneys.  Small retroperitoneal nodes are similar and not pathologic by size criteria.  Normal colon and terminal ileum.  Normal small bowel without abdominal ascites.    No pelvic adenopathy.  Normal urinary bladder and uterus. Probable residual follicle within the right ovary. No significant free fluid.  Interval healing of an right inferior pubic ramus pathologic fracture.  Increased sclerosis of the left femoral head at the site of a lytic lesion on the prior.  There is also new sclerosis in the left femoral neck and increased sclerosis in the parasymphyseal region of the pubic bones bilaterally.  Suspicion of developing pathologic fracture of the right acetabulum on image 100.  Prior vertebral augmentation at L1, the site of a moderate compression deformity.  L4 vertebral augmentation as well.  Minimal canal encroachment at L1 which is new since 06/22/2011.  IMPRESSION:  1.  No evidence of extra osseous metastasis within the abdomen or pelvis.  The previously described hepatic dome lesion is no longer identified, suggesting response to therapy of metastatic disease. 2.  New areas of osseous sclerosis. Although some of these could represent healing of lytic lesions since the prior exam, others represent  new sclerotic metastasis.  Suspicion of right acetabular pathologic fracture, likely excluding the weightbearing surface. 3.  Vertebral metastasis and prior vertebral augmentation, as detailed above.   Original Report Authenticated By: Consuello Bossier, M.D.    Mr Laqueta Jean Wo Contrast  09/22/2011  *RADIOLOGY REPORT*  Clinical Data: 56 year old female restaging scan status post SRS  for brain metastasis, lung cancer primary.  Treatment completed on 07/19/2011.  MRI HEAD WITHOUT AND WITH CONTRAST  Technique:  Multiplanar, multiecho pulse sequences of the brain and surrounding structures were obtained according to standard protocol without and with intravenous contrast  Contrast: 15mL MULTIHANCE GADOBENATE  DIMEGLUMINE 529 MG/ML IV SOLN  Comparison: 07/13/2011 and earlier.  Findings: No new metastatic foci identified in the brain.  Preexisting foci: - Right thalamic lesion now only faintly enhancing, series 10 image 82. - Medial left occipital lobe lesion no longer enhancing on series 10 image 66. - Superior left frontal gyrus lesion now only minimal if any enhancement, image 125. - Right inferior frontal lesion appears not significantly changed on image 68.  As before a linear area of enhancement in the right inferior parietal lobe abutting the right parieto-occipital sulcus is felt to be vascular related (image 77).  No restricted diffusion to suggest acute infarction.  No ventriculomegaly. No acute intracranial hemorrhage identified.  No mass effect.  No cerebral edema or new signal abnormality. Negative pituitary, cervicomedullary junction and visualized cervical spine. Visualized bone marrow signal is within normal limits.  Visualized orbit soft tissues are within normal limits.  Stable mild mucosal thickening in the left maxillary sinus.  New left mastoid effusion.  Negative visualized nasopharynx. Negative scalp soft tissues.  IMPRESSION: 1.  Four small intracranial metastases are stable or regressed as detailed above. 2.  No new brain metastasis.   Original Report Authenticated By: Harley Hallmark, M.D.    Mr Thoracic Spine W Wo Contrast  09/05/2011  *RADIOLOGY REPORT*  Clinical Data: Progressive back pain.  Osseous metastases of lung cancer.  Status post tumor ablation and vertebral augmentation at T8, L1, and L4.  MRI THORACIC SPINE WITHOUT AND WITH CONTRAST  Technique:  Multiplanar  and multiecho pulse sequences of the thoracic spine were obtained without and with intravenous contrast.  Contrast: 15mL MULTIHANCE GADOBENATE DIMEGLUMINE 529 MG/ML IV SOLN  Comparison: MRI of the thoracic spine without and with contrast 07/04/2011.  Findings: The patient is status post vertebral augmentation at T8-2 and L1.  The residual vertebral body height at T8 is diminished compared with prior study.  There is slight posterior bowing of the vertebral body without canal compromise, not significantly changed. The vertebral body height at L1 is maintained.  Methylmethacrylate is again noted within the T12-L1 disc space.  Retropulsed bone along the inferior aspect of L1 is stable without significant canal compromise.  Imaging the paraspinal soft tissues is unremarkable.  The metastasis along the left aspect of the T4 vertebral body is slightly larger than on the prior study.  The precontrast T1 shortening has increased throughout the normal marrow spaces.  This may result of interval spinal radiation.  A new superior endplate fractures are present at T7 and also at T10 without significant retropulsion of bone.  The extent of dural enhancement is significantly diminished.  This is now evident within the ventral aspect of the canal only from T7-T9 and posteriorly from T7-8 through T8-9.  A slight superior endplate fracture is present at T9 without significant loss of disc height.  An inferior plate Schmorl's node at T11 is stable.  No significant disc herniation or stenosis is present.  Minimal osseous foraminal narrowing is present on the right at T8-T9 and T9- 10, new from prior exam.  Postcontrast images demonstrate enhancement associated with the new fractures at T7, T9, and T10.  More diffuse fracture at T7 suggests a pathologic fracture.  It is not clear that the T9 and T10 fractures are pathologic.  IMPRESSION:  1.  Interval vertebral augmentation at T8 and L1.  There is some loss of height at T8 relative to  the prior study without increased retropulsion of bone. 2.  New superior endplate  compression fractures at T7, T9, and T10. Loss of height is most significant at T7.  There is no significant retropulsion of bone or central canal compromise at these levels. 3.  Minimal right foraminal narrowing at T8-9 and T9-10 is new. 4.  Significant reduction in the extent of dural enhancement now extending from T7-T9 as described. 5.  Slight increase in size of a left-sided osseous metastasis at T4.   Original Report Authenticated By: Jamesetta Orleans. MATTERN, M.D.    Mr Lumbar Spine W Wo Contrast  09/21/2011  *RADIOLOGY REPORT*  Clinical Data: Lung cancer.  Pain and tingling left leg.  Recent radiofrequency ablation of tumor and T9, T10, and T7  MRI LUMBAR SPINE WITHOUT AND WITH CONTRAST  Technique:  Multiplanar and multiecho pulse sequences of the lumbar spine were obtained without and with intravenous contrast.  Contrast: 15mL MULTIHANCE GADOBENATE DIMEGLUMINE 529 MG/ML IV SOLN  Comparison: MRI 07/04/2011  Findings: Metastatic deposit at T10 is only partially covered on this study.  This has progressed considerably since the MRI of 07/04/2011.  This level shows a pathologic fracture on the CT of 09/11/2011.  Metastatic disease to the L1 vertebral body with pathologic fracture.  Retropulsion of tumor or bone into the canal is similar to the prior study.  Metastatic disease to L4 as noted previously.  There is infiltration of the left pedicle by tumor with tumor extending into the left foramen which could cause a left L4 radiculopathy.  This was present previously.  Metastatic disease to the right sacrum and iliac bone also present previously and similar.  Conus medullaris is normal.  No compression of the conus medullaris.  No retroperitoneal adenopathy.  No new bony metastatic deposits are identified.  IMPRESSION: Metastatic disease at   L1, L4, and right sacrum and iliac bones are similar to the prior MRI. T10 lesion has  progressed since the MRI of 06/26/2011.  This is been treated with radiofrequency ablation and vertebral body augmentation recently.  Tumor infiltration of the left L4 pedicle with left L4 nerve root impingement, similar to the prior study.   Original Report Authenticated By: Camelia Phenes, M.D.    Nm Bone Scan Whole Body  09/11/2011  *RADIOLOGY REPORT*  Clinical Data: Metastatic lung cancer.  NUCLEAR MEDICINE WHOLE BODY BONE SCINTIGRAPHY  Technique:  Whole body anterior and posterior images were obtained approximately 3 hours after intravenous injection of radiopharmaceutical.  Radiopharmaceutical: 25.1MILLI CURIE TC-MDP TECHNETIUM TC 65M MEDRONATE IV KIT  Comparison: Bone scan dated 06/22/2011  Findings: There has been overall decreased activity in most of the metastatic lesions seen on the prior exam.  Particularly, there has been marked decrease in activity at T8.  There is a new area of increased activity in the superior endplate of T10 consistent with the new compression fracture of the superior endplate of T10 seen on the chest CT dated 09/11/2011.  There are no other new lesions.  IMPRESSION:  1.  New abnormal activity in the superior endplate of T10 consistent with the superior endplate compression fracture seen on the prior CT scan of 09/11/2011. 2.  There has been overall decrease in the activity in the numerous metastatic lesions seen on the prior study.  There are no new lesions other than the activity at T10.   Original Report Authenticated By: Gwynn Burly, M.D.    Ct Abdomen Pelvis W Contrast  09/11/2011  *RADIOLOGY REPORT*  Clinical Data:  Metastatic lung cancer.  Assess response to chemotherapy.  CT CHEST,  ABDOMEN AND PELVIS WITH CONTRAST  Technique: Contiguous axial images of the chest abdomen and pelvis were obtained after IV contrast administration.  Contrast: 100  ml Omnipaque-300  Comparison: Plain film chest 09/06/2011.  Prior chest CT of 07/04/2011. Chest, abdomen, pelvic CT  06/22/2011 (to which all measurement comparisons will be made)  CT CHEST  Findings: Lung windows demonstrate marked improvement in pulmonary metastatic burden.  The vast majority of pulmonary nodules have resolved.  A dominant left lower lobe lung mass has been replaced by an area of soft tissue density which is primarily felt to represent scarring.  This measures 1.5 x 1.8 cm on image 33. Similarly, superior segment left lower lobe, 8 mm soft tissue density which is primarily felt to represent scarring is at the site of a 1.6 cm nodule on the prior exam.  No new nodules are identified.  Soft tissue windows demonstrate resolution of left supraclavicular adenopathy.  Borderline cardiomegaly, without pericardial effusion. There is trace left pleural fluid, decreased from 07/04/2011.  No right-sided pleural effusion. No central pulmonary embolism, on this non-dedicated study.  Resolved mediastinal adenopathy.  A precarinal node measures 5 mm on image 17. No hilar adenopathy.  Interval sclerosis at the site of a posterior left fifth rib expansile lytic lesion on the prior exam; image 14 today.  New sclerosis involving the third anterior right rib at the site of a lytic lesion on the prior.  Image 23 today.  New sclerotic lesion involving the T4 vertebral body and C7 vertebral body.  Vertebral augmentation at T8; the site of a moderate compression deformity.  There is mild superior endplate compression deformity at T10 which is new since 06/22/2011.  IMPRESSION: 1. Since 06/22/2011, marked response to therapy of extra osseous metastatic disease throughout the chest. 2.  No new or progressive extra osseous disease. 3.  Decreased trace left pleural fluid. 4.  New areas of sclerosis within bilateral ribs with new T4 sclerotic lesion and mild T10 compression deformity.  The areas of rib sclerosis could represent healing of metastasis or progressive disease.  CT ABDOMEN AND PELVIS  Findings:  Focal steatosis adjacent the  falciform ligament. Resolution of the previously described hepatic dome lesion.  Normal spleen, stomach, pancreas, gallbladder, biliary tract, adrenal glands, kidneys.  Small retroperitoneal nodes are similar and not pathologic by size criteria.  Normal colon and terminal ileum.  Normal small bowel without abdominal ascites.    No pelvic adenopathy.  Normal urinary bladder and uterus. Probable residual follicle within the right ovary. No significant free fluid.  Interval healing of an right inferior pubic ramus pathologic fracture.  Increased sclerosis of the left femoral head at the site of a lytic lesion on the prior.  There is also new sclerosis in the left femoral neck and increased sclerosis in the parasymphyseal region of the pubic bones bilaterally.  Suspicion of developing pathologic fracture of the right acetabulum on image 100.  Prior vertebral augmentation at L1, the site of a moderate compression deformity.  L4 vertebral augmentation as well.  Minimal canal encroachment at L1 which is new since 06/22/2011.  IMPRESSION:  1.  No evidence of extra osseous metastasis within the abdomen or pelvis.  The previously described hepatic dome lesion is no longer identified, suggesting response to therapy of metastatic disease. 2.  New areas of osseous sclerosis. Although some of these could represent healing of lytic lesions since the prior exam, others represent  new sclerotic metastasis.  Suspicion of right acetabular pathologic fracture,  likely excluding the weightbearing surface. 3.  Vertebral metastasis and prior vertebral augmentation, as detailed above.   Original Report Authenticated By: Consuello Bossier, M.D.    Ir Bone Tumor Rf Ablation  09/15/2011  *RADIOLOGY REPORT*  Clinical Data: Patient with lung carcinoma with metastatic disease to the spine. New painful compression fractures at T7, T9 and T10.  Examination.  Percutaneous radio frequency ablation of tumor at  T 9,T10 and T7 the followed by vertebral  body augmentation for pain relief .  Comparison: MRI of the thoracic spine of 09/04/2011.  Following a full explanation of the procedure along with the potential associated  complications, an informed witnessed consent was obtained.  The   patient was put under general anesthetic by the Department of Anesthesiology [facility].  The skin overlying the thoracic spine was then prepped and draped in the usual sterile fashion  The skin overlying the right pedicle at T7, the left pedicle at T9, the right pedicle at T10 was then infiltrated with 0.25% bupivacaine.  Using biplane intermittent fluoroscopy, a 10.5-gauge Dfine  spine needles were then advanced into the posterior aspect of T7, T9 and T10.  At age of these levels, using a separate biopsy devices, biopsies were obtained and sent for pathologic analysis  Through each of these devices at three levels, the Dfine radiofrequency ablation device was then advanced distal to the tips of the needles and manipulated using biplane intermittent fluoroscopy, and ablation performed to 50 degrees Celsius.  At T10, ablation was performed in the contralateral neutral position, a contralateral inferior positions for approximately 2 minutes.  At T8-T9 ablation time will was 2 minutes and 30 seconds in the neutral contralateral and sent for positions.  At T7, the ablation time was t 2 minutes and 10 seconds.  At each levels, the needles were then removed and hemostasis achieved at the skin entry sites.  There were no acute complications.  The patient remains stable hemodynamically and neurologically  The patient was then extubated without difficulty., however the patient demonstrated no new neurological signs or symptoms. She was then transferred to the Neuro PACU Q for observations and then transferred to the short stay area prior to discharge  She was given special instructions.  She will return for follow-up in the clinic in 2 weeks time.  Impression. 1.  Status post  fluoroscopic-guided needle placement for radiofrequency tumor ablation at T7, T9-T10, followed by vertebral body augmentation using the Dfine kyphoplasty knee as described above.   Original Report Authenticated By: Oneal Grout, M.D.     Impression:  The patient is recovering from the effects of radiation.    Plan:   I saw the patient with Dr. Venetia Maxon of neurosurgery. We looked at the patient's MRI of her brain and spine with her. We also discussed the patient and her brain tumor conference. We feel that the radiculopathy down her left leg is related to some changes in the left L4 vertebrae/pedicle with increased swelling around at the nerve root. There does not appear to be any physical compression on the nerve. It does not seem to be related to any clear progression of disease. This area was treated previously with external beam radiotherapy in June/July.  I do not recommend repeat radiation to this area as there is no clear evidence of disease progression. Rather, we will treat this area symptomatically. We will continue Decadron, taper it fairly rapidly through September 16 and then she will stop it. We  instructed her that if her symptoms get worse, she would be a candidate for a steroid injection locally. The patient is also given a prescription for Neurontin by Dr. Venetia Maxon to titrate as needed for her symptoms.  As for her brain disease, no evidence of progression or recurrence. Good response at all 4 lesions. I will see her back for followup in approximately 3 months with a brain MRI at that time. The patient was encouraged to call if she has any questions in the interim.  _____________________________________   Lonie Peak, MD

## 2011-09-25 NOTE — Progress Notes (Signed)
Patient presents to the clinic today accompanied by her husband for a follow up appointment with Dr. Gaston Islam and Dr. Basilio Cairo. Patient is alert and oriented to person, place, and time. No distress noted. Steady gait noted. Pleasant affect noted. Patient denies pain. However, patient reports left leg numbness from the hip to the foot and that the middle of her back is sore. Patient reports that she has had her second vertebroplasty the last Friday in August. Patient reports taking Decadron 8 mg tid. Patient reports taking Tarceva 125 mg. Patient reports nausea and vomiting related to tarveca. Pitting edema +1 noted in both lower extremities. Patient reports daily headache related to Decadron. Patient reports that she began again taking Decadron this Saturday. Patient denies dizziness today. Patient reports floaters in her right eye since MRI on 09/21/2011. Patient denies hearing changes. Patient reports a good appetite when she is off the Tarceva but, denies having one when on the medication. Patient reports sleeping without difficulty. Reported all findings to the physicians.

## 2011-09-27 ENCOUNTER — Telehealth (HOSPITAL_COMMUNITY): Payer: Self-pay

## 2011-09-27 NOTE — Telephone Encounter (Signed)
I spoke with Mrs. [name] this morning in regards to her F/U apt.  She decided to wait until after her apt next Monday.  If she is feeling worse she will call.  She did mention that her posture has gotten bad.  She feels like and 56 year old woman.  Dr. Corliss Skains suggested that she practice her posture.

## 2011-09-28 ENCOUNTER — Ambulatory Visit (HOSPITAL_COMMUNITY): Payer: BC Managed Care – PPO

## 2011-10-05 ENCOUNTER — Telehealth: Payer: Self-pay | Admitting: *Deleted

## 2011-10-05 NOTE — Telephone Encounter (Incomplete)
Pt called from her dentist's office wanting to know if it was ok to have her teeth cleaned.

## 2011-10-10 ENCOUNTER — Ambulatory Visit (HOSPITAL_BASED_OUTPATIENT_CLINIC_OR_DEPARTMENT_OTHER): Payer: BC Managed Care – PPO

## 2011-10-10 ENCOUNTER — Ambulatory Visit (HOSPITAL_BASED_OUTPATIENT_CLINIC_OR_DEPARTMENT_OTHER): Payer: BC Managed Care – PPO | Admitting: Hematology & Oncology

## 2011-10-10 ENCOUNTER — Other Ambulatory Visit (HOSPITAL_BASED_OUTPATIENT_CLINIC_OR_DEPARTMENT_OTHER): Payer: BC Managed Care – PPO | Admitting: Lab

## 2011-10-10 VITALS — BP 123/70 | HR 87 | Temp 98.2°F | Resp 18 | Ht 62.0 in | Wt 156.0 lb

## 2011-10-10 DIAGNOSIS — C7951 Secondary malignant neoplasm of bone: Secondary | ICD-10-CM

## 2011-10-10 DIAGNOSIS — C3490 Malignant neoplasm of unspecified part of unspecified bronchus or lung: Secondary | ICD-10-CM

## 2011-10-10 DIAGNOSIS — C7952 Secondary malignant neoplasm of bone marrow: Secondary | ICD-10-CM

## 2011-10-10 DIAGNOSIS — C3430 Malignant neoplasm of lower lobe, unspecified bronchus or lung: Secondary | ICD-10-CM

## 2011-10-10 DIAGNOSIS — R609 Edema, unspecified: Secondary | ICD-10-CM

## 2011-10-10 LAB — CBC WITH DIFFERENTIAL (CANCER CENTER ONLY)
BASO#: 0.1 10*3/uL (ref 0.0–0.2)
BASO%: 1 % (ref 0.0–2.0)
EOS%: 3.1 % (ref 0.0–7.0)
HGB: 12.5 g/dL (ref 11.6–15.9)
MCH: 31.5 pg (ref 26.0–34.0)
MCHC: 34 g/dL (ref 32.0–36.0)
MONO%: 8.7 % (ref 0.0–13.0)
NEUT#: 3.9 10*3/uL (ref 1.5–6.5)
Platelets: 391 10*3/uL (ref 145–400)
RDW: 20.2 % — ABNORMAL HIGH (ref 11.1–15.7)

## 2011-10-10 LAB — CMP (CANCER CENTER ONLY)
Alkaline Phosphatase: 119 U/L — ABNORMAL HIGH (ref 26–84)
BUN, Bld: 9 mg/dL (ref 7–22)
Glucose, Bld: 94 mg/dL (ref 73–118)
Sodium: 143 mEq/L (ref 128–145)
Total Bilirubin: 0.9 mg/dl (ref 0.20–1.60)
Total Protein: 5.9 g/dL — ABNORMAL LOW (ref 6.4–8.1)

## 2011-10-10 MED ORDER — SODIUM CHLORIDE 0.9 % IV SOLN
Freq: Once | INTRAVENOUS | Status: AC
  Administered 2011-10-10: 09:00:00 via INTRAVENOUS

## 2011-10-10 MED ORDER — ZOLEDRONIC ACID 4 MG/5ML IV CONC
4.0000 mg | Freq: Once | INTRAVENOUS | Status: AC
  Administered 2011-10-10: 4 mg via INTRAVENOUS
  Filled 2011-10-10: qty 5

## 2011-10-10 NOTE — Patient Instructions (Addendum)
Zoledronic Acid injection (Hypercalcemia, Oncology) What is this medicine? ZOLEDRONIC ACID (ZOE le dron ik AS id) lowers the amount of calcium loss from bone. It is used to treat too much calcium in your blood from cancer. It is also used to prevent complications of cancer that has spread to the bone. This medicine may be used for other purposes; ask your health care provider or pharmacist if you have questions. What should I tell my health care provider before I take this medicine? They need to know if you have any of these conditions: -aspirin-sensitive asthma -dental disease -kidney disease -an unusual or allergic reaction to zoledronic acid, other medicines, foods, dyes, or preservatives -pregnant or trying to get pregnant -breast-feeding How should I use this medicine? This medicine is for infusion into a vein. It is given by a health care professional in a hospital or clinic setting. Talk to your pediatrician regarding the use of this medicine in children. Special care may be needed. Overdosage: If you think you have taken too much of this medicine contact a poison control center or emergency room at once. NOTE: This medicine is only for you. Do not share this medicine with others. What if I miss a dose? It is important not to miss your dose. Call your doctor or health care professional if you are unable to keep an appointment. What may interact with this medicine? -certain antibiotics given by injection -NSAIDs, medicines for pain and inflammation, like ibuprofen or naproxen -some diuretics like bumetanide, furosemide -teriparatide -thalidomide This list may not describe all possible interactions. Give your health care provider a list of all the medicines, herbs, non-prescription drugs, or dietary supplements you use. Also tell them if you smoke, drink alcohol, or use illegal drugs. Some items may interact with your medicine. What should I watch for while using this medicine? Visit  your doctor or health care professional for regular checkups. It may be some time before you see the benefit from this medicine. Do not stop taking your medicine unless your doctor tells you to. Your doctor may order blood tests or other tests to see how you are doing. Women should inform their doctor if they wish to become pregnant or think they might be pregnant. There is a potential for serious side effects to an unborn child. Talk to your health care professional or pharmacist for more information. You should make sure that you get enough calcium and vitamin D while you are taking this medicine. Discuss the foods you eat and the vitamins you take with your health care professional. Some people who take this medicine have severe bone, joint, and/or muscle pain. This medicine may also increase your risk for a broken thigh bone. Tell your doctor right away if you have pain in your upper leg or groin. Tell your doctor if you have any pain that does not go away or that gets worse. What side effects may I notice from receiving this medicine? Side effects that you should report to your doctor or health care professional as soon as possible: -allergic reactions like skin rash, itching or hives, swelling of the face, lips, or tongue -anxiety, confusion, or depression -breathing problems -changes in vision -feeling faint or lightheaded, falls -jaw burning, cramping, pain -muscle cramps, stiffness, or weakness -trouble passing urine or change in the amount of urine Side effects that usually do not require medical attention (report to your doctor or health care professional if they continue or are bothersome): -bone, joint, or muscle pain -  fever -hair loss -irritation at site where injected -loss of appetite -nausea, vomiting -stomach upset -tired This list may not describe all possible side effects. Call your doctor for medical advice about side effects. You may report side effects to FDA at  1-800-FDA-1088. Where should I keep my medicine? This drug is given in a hospital or clinic and will not be stored at home. NOTE: This sheet is a summary. It may not cover all possible information. If you have questions about this medicine, talk to your doctor, pharmacist, or health care provider.  2012, [employer]. (07/01/2010 9:06:58 AM) 

## 2011-10-10 NOTE — Progress Notes (Signed)
This office note has been dictated.

## 2011-10-10 NOTE — Progress Notes (Signed)
CC:   [name], M.D.  DIAGNOSIS:  Metastatic adenocarcinoma of the lung, EGFR positive.  CURRENT THERAPY: 1. Tarceva 125 mg p.o. daily. 2. Zometa 4 mg IV q.month.  INTERIM HISTORY:  [name] comes in for followup.  She did undergo another kyphoplasty.  This was done on the lower back.  She was having a lot of pain in the lower back.  She was complaining of some tingling in the left leg.  This has gotten better.  I think we did put her on some Decadron to try to help with this.  This has been helpful.  Her appetite is good.  She is eating better.  The patient is working okay.  Her skin has not been too affected by the Tarceva.  She did have 1 episode of diarrhea over the weekend.  She has had none since.  PHYSICAL EXAMINATION:  General:  This is a well-developed, well- nourished white female in no obvious distress.  Vital signs:  98.2, pulse 87, respiratory rate 18, blood pressure 123/70.  Weight is 156. Head and neck:  Normocephalic, atraumatic skull.  There are no ocular or oral lesions.  There are no palpable cervical or supraclavicular lymph nodes.  Lungs:  Clear bilaterally.  Cardiac:  Regular rate and rhythm with a normal S1, S2.  There are no murmurs, rubs or bruits.  Abdomen: Soft with good bowel sounds.  There is no palpable abdominal mass. There is no palpable hepatosplenomegaly.  Back:  No tenderness over the spine, ribs or hips.  Extremities:  Shows some 1-2+ edema in her feet bilaterally.  Neurological:  Shows no focal neurological deficits.  LABORATORY STUDIES:  White cell count is 5.2, hemoglobin 12.5, hematocrit 36.8, platelet count is 391.  Her sodium 143, potassium 4.3, BUN 9, creatinine 0.7.  LFTs are normal.  IMPRESSION:  [name] is a 56 year old female with metastatic adenocarcinoma of the lung.  She actually is doing quite well.  She has responded by her last set of scans.  We will go ahead and give her Zometa today.  I think this will  continue to help with her bones.  I am more worried about the swelling down the legs.  I do have her on some diuretic.  Again, I am not sure she is taking this or not.  I told her to stop the doxycycline.  Skin really has not been affected by the Tarceva that much and we hopefully can get her off the doxycycline.  I want to see her back in another few weeks or so.  We will do the Zometa when she gets back.  Again, will see how her legs look.    ______________________________ Josph Macho, M.D. PRE/MEDQ  D:  10/10/2011  T:  10/10/2011  Job:  [id number]

## 2011-10-31 ENCOUNTER — Ambulatory Visit (HOSPITAL_BASED_OUTPATIENT_CLINIC_OR_DEPARTMENT_OTHER): Payer: BC Managed Care – PPO | Admitting: Hematology & Oncology

## 2011-10-31 ENCOUNTER — Other Ambulatory Visit (HOSPITAL_BASED_OUTPATIENT_CLINIC_OR_DEPARTMENT_OTHER): Payer: BC Managed Care – PPO | Admitting: Lab

## 2011-10-31 ENCOUNTER — Ambulatory Visit (HOSPITAL_BASED_OUTPATIENT_CLINIC_OR_DEPARTMENT_OTHER): Payer: BC Managed Care – PPO

## 2011-10-31 VITALS — BP 120/60 | HR 86 | Temp 98.0°F | Resp 16 | Ht 62.0 in | Wt 158.0 lb

## 2011-10-31 DIAGNOSIS — C7951 Secondary malignant neoplasm of bone: Secondary | ICD-10-CM

## 2011-10-31 DIAGNOSIS — C3490 Malignant neoplasm of unspecified part of unspecified bronchus or lung: Secondary | ICD-10-CM

## 2011-10-31 DIAGNOSIS — C3430 Malignant neoplasm of lower lobe, unspecified bronchus or lung: Secondary | ICD-10-CM

## 2011-10-31 DIAGNOSIS — C7952 Secondary malignant neoplasm of bone marrow: Secondary | ICD-10-CM

## 2011-10-31 DIAGNOSIS — IMO0002 Reserved for concepts with insufficient information to code with codable children: Secondary | ICD-10-CM

## 2011-10-31 LAB — CBC WITH DIFFERENTIAL (CANCER CENTER ONLY)
BASO#: 0.1 10*3/uL (ref 0.0–0.2)
BASO%: 0.6 % (ref 0.0–2.0)
EOS%: 1.9 % (ref 0.0–7.0)
HGB: 13 g/dL (ref 11.6–15.9)
LYMPH#: 0.8 10*3/uL — ABNORMAL LOW (ref 0.9–3.3)
MCH: 32.4 pg (ref 26.0–34.0)
MCHC: 34.4 g/dL (ref 32.0–36.0)
MONO%: 9.3 % (ref 0.0–13.0)
NEUT#: 6.8 10*3/uL — ABNORMAL HIGH (ref 1.5–6.5)
Platelets: 474 10*3/uL — ABNORMAL HIGH (ref 145–400)

## 2011-10-31 LAB — CMP (CANCER CENTER ONLY)
ALT(SGPT): 19 U/L (ref 10–47)
AST: 22 U/L (ref 11–38)
Alkaline Phosphatase: 147 U/L — ABNORMAL HIGH (ref 26–84)
CO2: 30 mEq/L (ref 18–33)
Creat: 0.8 mg/dl (ref 0.6–1.2)
Sodium: 138 mEq/L (ref 128–145)
Total Bilirubin: 0.8 mg/dl (ref 0.20–1.60)

## 2011-10-31 LAB — LACTATE DEHYDROGENASE: LDH: 183 U/L (ref 94–250)

## 2011-10-31 LAB — PREALBUMIN: Prealbumin: 14.6 mg/dL — ABNORMAL LOW (ref 17.0–34.0)

## 2011-10-31 MED ORDER — ZOLEDRONIC ACID 4 MG/5ML IV CONC
4.0000 mg | Freq: Once | INTRAVENOUS | Status: AC
  Administered 2011-10-31: 4 mg via INTRAVENOUS
  Filled 2011-10-31: qty 5

## 2011-10-31 MED ORDER — ONDANSETRON 8 MG PO FILM: 8.0000 mg | ORAL_FILM | Freq: Two times a day (BID) | ORAL | Status: DC | PRN

## 2011-10-31 MED ORDER — MUPIROCIN 2 % EX OINT: TOPICAL_OINTMENT | Freq: Three times a day (TID) | CUTANEOUS | Status: DC

## 2011-10-31 NOTE — Progress Notes (Signed)
This office note has been dictated.

## 2011-10-31 NOTE — Patient Instructions (Signed)
Zoledronic Acid injection (Hypercalcemia, Oncology) What is this medicine? ZOLEDRONIC ACID (ZOE le dron ik AS id) lowers the amount of calcium loss from bone. It is used to treat too much calcium in your blood from cancer. It is also used to prevent complications of cancer that has spread to the bone. This medicine may be used for other purposes; ask your health care provider or pharmacist if you have questions. What should I tell my health care provider before I take this medicine? They need to know if you have any of these conditions: -aspirin-sensitive asthma -dental disease -kidney disease -an unusual or allergic reaction to zoledronic acid, other medicines, foods, dyes, or preservatives -pregnant or trying to get pregnant -breast-feeding How should I use this medicine? This medicine is for infusion into a vein. It is given by a health care professional in a hospital or clinic setting. Talk to your pediatrician regarding the use of this medicine in children. Special care may be needed. Overdosage: If you think you have taken too much of this medicine contact a poison control center or emergency room at once. NOTE: This medicine is only for you. Do not share this medicine with others. What if I miss a dose? It is important not to miss your dose. Call your doctor or health care professional if you are unable to keep an appointment. What may interact with this medicine? -certain antibiotics given by injection -NSAIDs, medicines for pain and inflammation, like ibuprofen or naproxen -some diuretics like bumetanide, furosemide -teriparatide -thalidomide This list may not describe all possible interactions. Give your health care provider a list of all the medicines, herbs, non-prescription drugs, or dietary supplements you use. Also tell them if you smoke, drink alcohol, or use illegal drugs. Some items may interact with your medicine. What should I watch for while using this medicine? Visit  your doctor or health care professional for regular checkups. It may be some time before you see the benefit from this medicine. Do not stop taking your medicine unless your doctor tells you to. Your doctor may order blood tests or other tests to see how you are doing. Women should inform their doctor if they wish to become pregnant or think they might be pregnant. There is a potential for serious side effects to an unborn child. Talk to your health care professional or pharmacist for more information. You should make sure that you get enough calcium and vitamin D while you are taking this medicine. Discuss the foods you eat and the vitamins you take with your health care professional. Some people who take this medicine have severe bone, joint, and/or muscle pain. This medicine may also increase your risk for a broken thigh bone. Tell your doctor right away if you have pain in your upper leg or groin. Tell your doctor if you have any pain that does not go away or that gets worse. What side effects may I notice from receiving this medicine? Side effects that you should report to your doctor or health care professional as soon as possible: -allergic reactions like skin rash, itching or hives, swelling of the face, lips, or tongue -anxiety, confusion, or depression -breathing problems -changes in vision -feeling faint or lightheaded, falls -jaw burning, cramping, pain -muscle cramps, stiffness, or weakness -trouble passing urine or change in the amount of urine Side effects that usually do not require medical attention (report to your doctor or health care professional if they continue or are bothersome): -bone, joint, or muscle pain -  fever -hair loss -irritation at site where injected -loss of appetite -nausea, vomiting -stomach upset -tired This list may not describe all possible side effects. Call your doctor for medical advice about side effects. You may report side effects to FDA at  1-800-FDA-1088. Where should I keep my medicine? This drug is given in a hospital or clinic and will not be stored at home. NOTE: This sheet is a summary. It may not cover all possible information. If you have questions about this medicine, talk to your doctor, pharmacist, or health care provider.  2012, Elsevier/Gold Standard. (07/01/2010 9:06:58 AM) 

## 2011-11-01 ENCOUNTER — Telehealth: Payer: Self-pay | Admitting: Hematology & Oncology

## 2011-11-01 NOTE — Telephone Encounter (Signed)
Pt aware of 11-5 CT and MRI to drink contrast and be NPO 4 hrs. She is also aware of 11-12 MD appointment

## 2011-11-01 NOTE — Telephone Encounter (Signed)
Left message for pt to call for details of 11-21-11 appointments

## 2011-11-01 NOTE — Telephone Encounter (Signed)
Pt aware of ct mri and md appointments in nov. She is aware to be NPO 4 hrs and has to drink 2 bottles of contrast

## 2011-11-01 NOTE — Progress Notes (Signed)
CC:   [name], M.D.  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  CURRENT THERAPY: 1. Tarceva 125 mg p.o. daily. 2. Zometa 4 mg IV each month.  INTERIM HISTORY:  [name] comes in for follow-up.  She is still having the lower back issues.  These symptoms have gotten to be a little bit better, however.  She is on Neurontin now.  She is not a candidate for further radiation therapy to that region.  On her last MRI, she had L4 involvement.  She is still working.  She is trying to work full-time.  Her skin has been taking a little bit of a "hit" from the Tarceva.  Her 3rd finger on each hand also has some paronychia.  I will give her some Bactroban ointment for this.  She has got a little bit of a sunburn on her forearms.  She was out in the sun a little bit this past weekend.  She has had a little nausea, but no vomiting.  Zuplenz seems to work very well for her.  We will see about getting some more for her.  She has had no headache.  She has had no double vision or blurred vision.  She has had no cough.  PHYSICAL EXAMINATION:  General:  This is a well-developed, well- nourished white female in no obvious distress.  Vital Signs:  98, pulse 86, respiratory rate 16, blood pressure 120/60.  Weight is 158.  Head and Neck:  Shows no ocular or oral lesions.  She does have some macular, scaly rash on her face.  She has no adenopathy in her neck.  Lungs: Clear bilaterally.  Cardiac:  Regular rate and rhythm with a normal S1 and S2.  There are no murmurs, rubs, or bruits.  Abdomen:  Soft, good bowel sounds.  There is no palpable abdominal mass.  There is no fluid wave.  There is no palpable hepatosplenomegaly.  Extremities:  No clubbing, cyanosis, or edema.  There may be some slight non-pitting edema of the lower legs.  She has good range motion of her joints. Neurological:  No focal neurological deficits.  LABORATORY  STUDIES:  White cell count is 8.6, hemoglobin 13, hematocrit 38, platelet count 474.  IMPRESSION:  [name] is a 56 year old white female with metastatic adenocarcinoma of the lung.  Again, she is EGFR positive.  We will go ahead and plan for another follow-up CT scan on her.  Her last scan was done back in August.  I think we will get one at the end of the month and see how her response is being maintained.  We will go ahead and get her back in another month as per usual.    ______________________________ Josph Macho, M.D. PRE/MEDQ  D:  10/31/2011  T:  11/01/2011  Job:  [id number]

## 2011-11-08 ENCOUNTER — Telehealth: Payer: Self-pay | Admitting: *Deleted

## 2011-11-09 NOTE — Telephone Encounter (Signed)
error 

## 2011-11-21 ENCOUNTER — Other Ambulatory Visit (HOSPITAL_COMMUNITY): Payer: BC Managed Care – PPO

## 2011-11-21 ENCOUNTER — Other Ambulatory Visit: Payer: Self-pay | Admitting: Hematology & Oncology

## 2011-11-21 ENCOUNTER — Ambulatory Visit (HOSPITAL_COMMUNITY)
Admission: RE | Admit: 2011-11-21 | Discharge: 2011-11-21 | Disposition: A | Discharge status: Home / Self Care | Payer: BC Managed Care – PPO | Source: Ambulatory Visit | Attending: Hematology & Oncology | Admitting: Hematology & Oncology

## 2011-11-21 DIAGNOSIS — IMO0002 Reserved for concepts with insufficient information to code with codable children: Secondary | ICD-10-CM

## 2011-11-21 DIAGNOSIS — N6459 Other signs and symptoms in breast: Secondary | ICD-10-CM | POA: Insufficient documentation

## 2011-11-21 DIAGNOSIS — C3490 Malignant neoplasm of unspecified part of unspecified bronchus or lung: Secondary | ICD-10-CM | POA: Insufficient documentation

## 2011-11-21 DIAGNOSIS — C7951 Secondary malignant neoplasm of bone: Secondary | ICD-10-CM | POA: Insufficient documentation

## 2011-11-21 DIAGNOSIS — C7952 Secondary malignant neoplasm of bone marrow: Secondary | ICD-10-CM | POA: Insufficient documentation

## 2011-11-21 MED ORDER — GADOBENATE DIMEGLUMINE 529 MG/ML IV SOLN
15.0000 mL | Freq: Once | INTRAVENOUS | Status: AC | PRN
  Administered 2011-11-21: 14 mL via INTRAVENOUS

## 2011-11-21 MED ORDER — IOHEXOL 300 MG/ML  SOLN
100.0000 mL | Freq: Once | INTRAMUSCULAR | Status: AC | PRN
  Administered 2011-11-21: 100 mL via INTRAVENOUS

## 2011-11-24 ENCOUNTER — Other Ambulatory Visit: Payer: Self-pay | Admitting: Hematology & Oncology

## 2011-11-24 ENCOUNTER — Other Ambulatory Visit: Payer: Self-pay | Admitting: Nurse Practitioner

## 2011-11-24 DIAGNOSIS — IMO0002 Reserved for concepts with insufficient information to code with codable children: Secondary | ICD-10-CM

## 2011-11-24 DIAGNOSIS — Z1231 Encounter for screening mammogram for malignant neoplasm of breast: Secondary | ICD-10-CM

## 2011-11-24 DIAGNOSIS — N63 Unspecified lump in unspecified breast: Secondary | ICD-10-CM

## 2011-11-28 ENCOUNTER — Ambulatory Visit (HOSPITAL_BASED_OUTPATIENT_CLINIC_OR_DEPARTMENT_OTHER): Payer: BC Managed Care – PPO

## 2011-11-28 ENCOUNTER — Other Ambulatory Visit (HOSPITAL_BASED_OUTPATIENT_CLINIC_OR_DEPARTMENT_OTHER): Payer: BC Managed Care – PPO | Admitting: Lab

## 2011-11-28 ENCOUNTER — Telehealth: Payer: Self-pay | Admitting: Hematology & Oncology

## 2011-11-28 ENCOUNTER — Ambulatory Visit (HOSPITAL_BASED_OUTPATIENT_CLINIC_OR_DEPARTMENT_OTHER): Payer: BC Managed Care – PPO | Admitting: Hematology & Oncology

## 2011-11-28 VITALS — BP 136/76 | HR 78 | Temp 97.8°F | Resp 18 | Ht 62.0 in | Wt 158.0 lb

## 2011-11-28 DIAGNOSIS — C7951 Secondary malignant neoplasm of bone: Secondary | ICD-10-CM

## 2011-11-28 DIAGNOSIS — R911 Solitary pulmonary nodule: Secondary | ICD-10-CM

## 2011-11-28 DIAGNOSIS — C3490 Malignant neoplasm of unspecified part of unspecified bronchus or lung: Secondary | ICD-10-CM

## 2011-11-28 DIAGNOSIS — C3430 Malignant neoplasm of lower lobe, unspecified bronchus or lung: Secondary | ICD-10-CM

## 2011-11-28 DIAGNOSIS — IMO0002 Reserved for concepts with insufficient information to code with codable children: Secondary | ICD-10-CM

## 2011-11-28 DIAGNOSIS — C7952 Secondary malignant neoplasm of bone marrow: Secondary | ICD-10-CM

## 2011-11-28 LAB — CBC WITH DIFFERENTIAL (CANCER CENTER ONLY)
BASO#: 0 10*3/uL (ref 0.0–0.2)
HCT: 38.2 % (ref 34.8–46.6)
HGB: 13 g/dL (ref 11.6–15.9)
LYMPH#: 0.8 10*3/uL — ABNORMAL LOW (ref 0.9–3.3)
MONO#: 0.6 10*3/uL (ref 0.1–0.9)
NEUT%: 77.9 % (ref 39.6–80.0)
WBC: 7.7 10*3/uL (ref 3.9–10.0)

## 2011-11-28 LAB — CMP (CANCER CENTER ONLY)
ALT(SGPT): 18 U/L (ref 10–47)
BUN, Bld: 18 mg/dL (ref 7–22)
CO2: 28 mEq/L (ref 18–33)
Calcium: 9.1 mg/dL (ref 8.0–10.3)
Chloride: 99 mEq/L (ref 98–108)
Creat: 0.7 mg/dl (ref 0.6–1.2)

## 2011-11-28 MED ORDER — ZOLEDRONIC ACID 4 MG/5ML IV CONC
4.0000 mg | Freq: Once | INTRAVENOUS | Status: AC
  Administered 2011-11-28: 4 mg via INTRAVENOUS
  Filled 2011-11-28: qty 5

## 2011-11-28 MED ORDER — SODIUM CHLORIDE 0.9 % IV SOLN
INTRAVENOUS | Status: DC
  Administered 2011-11-28: 13:00:00 via INTRAVENOUS

## 2011-11-28 NOTE — Telephone Encounter (Signed)
Patient's December calendar was mailed to her home.

## 2011-11-28 NOTE — Progress Notes (Signed)
This office note has been dictated.

## 2011-11-29 NOTE — Progress Notes (Signed)
CC:   [name], M.D. Sanjeev K. Corliss Skains, M.D.  DIAGNOSIS:  Metastatic adenocarcinoma of the lung-EGFR positive.  CURRENT THERAPY: 1. Tarceva 125 mg p.o. daily. 2. Zometa 4 mg IV monthly.  INTERIM HISTORY:  [name] comes in for a followup.  She is still having some back issues.  We did go ahead and do staging studies on her.  Thankfully, she continues to respond to treatment.  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  She has improved lung nodules.  She still has a pathologic fracture at L1.  I spoke with Dr. Corliss Skains of [facility].  He will see her again to see about another kyphoplasty.  She is still working.  She wants to go to [place] in December for her son's graduation from [employer].  She is worried about a little diarrhea.  I told her that she needs to take Imodium on the flight, or beforehand, to try to decrease the chance of diarrhea.  She has had no cough or shortness breath.  She has had no problems with leg swelling.  There has been some dryness of her skin from the Tarceva.  PHYSICAL EXAMINATION:  General:  This is a well-developed, well- nourished white female in no obvious distress.  Vital signs: Temperature of 97.8, pulse 78, respiratory rate 18, blood pressure 136/76.  Weight is 158.  Head and neck:  Shows a normocephalic, atraumatic skull.  There are no ocular or oral lesions.  There are no palpable cervical or supraclavicular lymph nodes.  Lungs:  Clear bilaterally.  Cardiac:  Regular rate and rhythm with a normal S1 and S2. There are no murmurs, rubs, or bruits.  Abdomen:  Soft with good bowel sounds.  There is no palpable abdominal mass.  There is no palpable hepatosplenomegaly.  Back:  Some slight tenderness in the lower thoracic and upper lumbar region.  Some slight spasm is noted in the paravertebral muscles.  Extremities:  No clubbing, cyanosis, or edema. Skin:  Does show some dryness and a macular type rash  from Tarceva. Neurological:  No focal neurological deficits.  LABORATORY STUDIES:  White cell count of 7.7, hemoglobin 13, hematocrit 38, platelet count 489.  IMPRESSION:  [name] is a very nice 56 year old white female with metastatic adenocarcinoma of the lung.  Again, tumor is EGFR positive. She has responded to Tarceva nicely.  Again, we are focusing on quality of life.  Hopefully, she can have a kyphoplasty at L1.  I know that there is some issue with this.  Again, hopefully we will get her pain under better control.  We will get her back in another month.  She gets her Zometa today.    ______________________________ Josph Macho, M.D. PRE/MEDQ  D:  11/28/2011  T:  11/29/2011  Job:  [id number]

## 2011-12-01 ENCOUNTER — Ambulatory Visit (HOSPITAL_COMMUNITY): Admission: RE | Admit: 2011-12-01 | Payer: BC Managed Care – PPO | Source: Ambulatory Visit

## 2011-12-07 ENCOUNTER — Ambulatory Visit (HOSPITAL_COMMUNITY)
Admission: RE | Admit: 2011-12-07 | Discharge: 2011-12-07 | Disposition: A | Discharge status: Home / Self Care | Payer: BC Managed Care – PPO | Source: Ambulatory Visit | Attending: Hematology & Oncology | Admitting: Hematology & Oncology

## 2011-12-07 DIAGNOSIS — IMO0002 Reserved for concepts with insufficient information to code with codable children: Secondary | ICD-10-CM

## 2011-12-12 ENCOUNTER — Other Ambulatory Visit (HOSPITAL_COMMUNITY): Payer: Self-pay | Admitting: Interventional Radiology

## 2011-12-12 ENCOUNTER — Other Ambulatory Visit: Payer: Self-pay | Admitting: Radiology

## 2011-12-12 DIAGNOSIS — IMO0002 Reserved for concepts with insufficient information to code with codable children: Secondary | ICD-10-CM

## 2011-12-13 ENCOUNTER — Ambulatory Visit (HOSPITAL_COMMUNITY)
Admission: RE | Admit: 2011-12-13 | Discharge: 2011-12-13 | Disposition: A | Discharge status: Home / Self Care | Payer: BC Managed Care – PPO | Source: Ambulatory Visit | Attending: Interventional Radiology | Admitting: Interventional Radiology

## 2011-12-13 ENCOUNTER — Other Ambulatory Visit (HOSPITAL_COMMUNITY): Payer: Self-pay | Admitting: Interventional Radiology

## 2011-12-13 DIAGNOSIS — C3490 Malignant neoplasm of unspecified part of unspecified bronchus or lung: Secondary | ICD-10-CM | POA: Insufficient documentation

## 2011-12-13 DIAGNOSIS — C7931 Secondary malignant neoplasm of brain: Secondary | ICD-10-CM | POA: Insufficient documentation

## 2011-12-13 DIAGNOSIS — M8448XA Pathological fracture, other site, initial encounter for fracture: Secondary | ICD-10-CM | POA: Insufficient documentation

## 2011-12-13 DIAGNOSIS — IMO0002 Reserved for concepts with insufficient information to code with codable children: Secondary | ICD-10-CM

## 2011-12-13 DIAGNOSIS — C7952 Secondary malignant neoplasm of bone marrow: Secondary | ICD-10-CM | POA: Insufficient documentation

## 2011-12-13 DIAGNOSIS — C7951 Secondary malignant neoplasm of bone: Secondary | ICD-10-CM | POA: Insufficient documentation

## 2011-12-13 LAB — CBC WITH DIFFERENTIAL/PLATELET
Basophils Relative: 0 % (ref 0–1)
Hemoglobin: 12.6 g/dL (ref 12.0–15.0)
MCHC: 33.6 g/dL (ref 30.0–36.0)
Monocytes Relative: 10 % (ref 3–12)
Neutro Abs: 4.7 10*3/uL (ref 1.7–7.7)
Neutrophils Relative %: 73 % (ref 43–77)
Platelets: 426 10*3/uL — ABNORMAL HIGH (ref 150–400)
RBC: 4.03 MIL/uL (ref 3.87–5.11)

## 2011-12-13 LAB — COMPREHENSIVE METABOLIC PANEL
ALT: 14 U/L (ref 0–35)
AST: 28 U/L (ref 0–37)
Albumin: 2.8 g/dL — ABNORMAL LOW (ref 3.5–5.2)
Alkaline Phosphatase: 131 U/L — ABNORMAL HIGH (ref 39–117)
BUN: 15 mg/dL (ref 6–23)
Chloride: 105 mEq/L (ref 96–112)
Potassium: 3.5 mEq/L (ref 3.5–5.1)
Sodium: 141 mEq/L (ref 135–145)
Total Bilirubin: 0.4 mg/dL (ref 0.3–1.2)

## 2011-12-13 LAB — PROTIME-INR: Prothrombin Time: 11.7 seconds (ref 11.6–15.2)

## 2011-12-13 LAB — APTT: aPTT: 25 seconds (ref 24–37)

## 2011-12-13 MED ORDER — FENTANYL CITRATE 0.05 MG/ML IJ SOLN
INTRAMUSCULAR | Status: AC
  Filled 2011-12-13: qty 4

## 2011-12-13 MED ORDER — FENTANYL CITRATE 0.05 MG/ML IJ SOLN
INTRAMUSCULAR | Status: DC | PRN
  Administered 2011-12-13 (×4): 25 ug via INTRAVENOUS

## 2011-12-13 MED ORDER — MIDAZOLAM HCL 2 MG/2ML IJ SOLN
INTRAMUSCULAR | Status: DC | PRN
  Administered 2011-12-13 (×4): 1 mg via INTRAVENOUS

## 2011-12-13 MED ORDER — TOBRAMYCIN SULFATE 1.2 G IJ SOLR
INTRAMUSCULAR | Status: AC
  Filled 2011-12-13: qty 1.2

## 2011-12-13 MED ORDER — CEFAZOLIN SODIUM 1-5 GM-% IV SOLN
1.0000 g | Freq: Once | INTRAVENOUS | Status: AC
  Administered 2011-12-13: 1 g via INTRAVENOUS

## 2011-12-13 MED ORDER — HYDROMORPHONE HCL PF 1 MG/ML IJ SOLN
INTRAMUSCULAR | Status: DC | PRN
  Administered 2011-12-13: 1 mg

## 2011-12-13 MED ORDER — SODIUM CHLORIDE 0.9 % IV SOLN
Freq: Once | INTRAVENOUS | Status: AC
  Administered 2011-12-13: 1000 mL via INTRAVENOUS

## 2011-12-13 MED ORDER — MIDAZOLAM HCL 2 MG/2ML IJ SOLN
INTRAMUSCULAR | Status: AC
  Filled 2011-12-13: qty 4

## 2011-12-13 MED ORDER — SODIUM CHLORIDE 0.9 % IV SOLN: INTRAVENOUS | Status: AC

## 2011-12-13 MED ORDER — CEFAZOLIN SODIUM 1-5 GM-% IV SOLN
INTRAVENOUS | Status: AC
  Administered 2011-12-13: 1 g via INTRAVENOUS
  Filled 2011-12-13: qty 50

## 2011-12-13 MED ORDER — HYDROMORPHONE HCL PF 1 MG/ML IJ SOLN
INTRAMUSCULAR | Status: AC
  Filled 2011-12-13: qty 2

## 2011-12-13 NOTE — H&P (Signed)
[name] is an 56 y.o. female.   Chief Complaint: pt known to IR service Lung Ca hx with mets to bone and brain L1/L4/T8 KP 07/10/11 (ablation T8) T7/T9/T10 KP 07/14/11 Tumor ablation these levels 09/26/11 Scheduled now for Lumbar #3 tumor ablation and kyphoplasty HPI: Lung Ca; mets to bone and brain; IBS; back pain  Past Medical History  Diagnosis Date  . Pulmonary nodules 06/22/11    per CT scan  . IBS (irritable bowel syndrome)   . Fibroadenoma of breast     hx of  . Metastasis of unknown origin   . Hx of radiation therapy 07/05/11 -07/21/11    LS, Tspine  . Hx of radiation therapy 07/19/11    brain, [facility]  . Adenocarcinoma of lung   . 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 Headache     Past Surgical History  Procedure Date  . Lung biopsy   . Vertebroplasty   . Vertebroplasty     Family History  Problem Relation Age of Onset  . Cancer Mother     cervical  . Asthma Father    Social History:  reports that she has never smoked. She has never used smokeless tobacco. She reports that she does not drink alcohol or use illicit drugs.  Allergies: No Known Allergies   (Not in a hospital admission)  Results for orders placed during the hospital encounter of 12/13/11 (from the past 48 hour(s))  CBC WITH DIFFERENTIAL     Status: Abnormal   Collection Time   12/13/11  7:55 AM      Component Value Range Comment   WBC 6.4  4.0 - 10.5 K/uL    RBC 4.03  3.87 - 5.11 MIL/uL    Hemoglobin 12.6  12.0 - 15.0 g/dL    HCT 16.1  09.6 - 04.5 %    MCV 93.1  78.0 - 100.0 fL    MCH 31.3  26.0 - 34.0 pg    MCHC 33.6  30.0 - 36.0 g/dL    RDW 40.9  81.1 - 91.4 %    Platelets 426 (*) 150 - 400 K/uL    Neutrophils Relative 73  43 - 77 %    Neutro Abs 4.7  1.7 - 7.7 K/uL    Lymphocytes Relative 11 (*) 12 - 46 %    Lymphs Abs 0.7  0.7 - 4.0 K/uL    Monocytes Relative 10  3 - 12 %    Monocytes Absolute 0.6  0.1 - 1.0 K/uL    Eosinophils Relative 6  (*) 0 - 5 %    Eosinophils Absolute 0.4  0.0 - 0.7 K/uL    Basophils Relative 0  0 - 1 %    Basophils Absolute 0.0  0.0 - 0.1 K/uL    No results found.  Review of Systems  Constitutional: Negative for fever.  Cardiovascular: Negative for chest pain.  Gastrointestinal: Negative for nausea and vomiting.  Musculoskeletal: Positive for back pain.  Neurological: Positive for weakness. Negative for headaches.    Blood pressure 111/72, pulse 87, temperature 96.7 F (35.9 C), temperature source Oral, resp. rate 18, height 5\' 2"  (1.575 m), weight 158 lb (71.668 kg), SpO2 98.00%. Physical Exam  Constitutional: She is oriented to person, place, and time. She appears well-developed.  Cardiovascular: Normal rate, regular rhythm and normal heart sounds.   No murmur heard. Respiratory: Effort normal and breath sounds normal. She has no wheezes.  GI:  Soft. Bowel sounds are normal. There is no tenderness.  Musculoskeletal: Normal range of motion.       Slow/weak legs  Neurological: She is alert and oriented to person, place, and time.  Skin: Skin is warm.  Psychiatric: She has a normal mood and affect. Her behavior is normal. Judgment and thought content normal.     Assessment/Plan L3 fx; back pain Scheduled for KP and ablation Hx lung ca Pt aware of procedure benefits and risks and agreeable to proceed. Consent signed and in chart  Kimothy Kishimoto A 12/13/2011, 8:38 AM

## 2011-12-13 NOTE — Procedures (Signed)
S/P L3 tumor ablation,followed by vertebral augmentation.

## 2011-12-15 ENCOUNTER — Telehealth (HOSPITAL_COMMUNITY): Payer: Self-pay

## 2011-12-18 ENCOUNTER — Other Ambulatory Visit (HOSPITAL_COMMUNITY): Payer: Self-pay | Admitting: Interventional Radiology

## 2011-12-18 ENCOUNTER — Other Ambulatory Visit: Payer: Self-pay | Admitting: Nurse Practitioner

## 2011-12-18 DIAGNOSIS — IMO0002 Reserved for concepts with insufficient information to code with codable children: Secondary | ICD-10-CM

## 2011-12-18 DIAGNOSIS — N63 Unspecified lump in unspecified breast: Secondary | ICD-10-CM

## 2011-12-25 ENCOUNTER — Other Ambulatory Visit: Payer: BC Managed Care – PPO

## 2011-12-27 ENCOUNTER — Ambulatory Visit (HOSPITAL_COMMUNITY)
Admission: RE | Admit: 2011-12-27 | Discharge: 2011-12-27 | Disposition: A | Discharge status: Home / Self Care | Payer: BC Managed Care – PPO | Source: Ambulatory Visit | Attending: Interventional Radiology | Admitting: Interventional Radiology

## 2011-12-27 DIAGNOSIS — IMO0002 Reserved for concepts with insufficient information to code with codable children: Secondary | ICD-10-CM

## 2011-12-28 ENCOUNTER — Ambulatory Visit (HOSPITAL_BASED_OUTPATIENT_CLINIC_OR_DEPARTMENT_OTHER): Payer: BC Managed Care – PPO

## 2011-12-28 ENCOUNTER — Ambulatory Visit (HOSPITAL_BASED_OUTPATIENT_CLINIC_OR_DEPARTMENT_OTHER): Payer: BC Managed Care – PPO | Admitting: Medical

## 2011-12-28 ENCOUNTER — Other Ambulatory Visit (HOSPITAL_BASED_OUTPATIENT_CLINIC_OR_DEPARTMENT_OTHER): Payer: BC Managed Care – PPO | Admitting: Lab

## 2011-12-28 VITALS — BP 141/73 | HR 99 | Temp 98.1°F | Resp 18 | Ht 62.0 in | Wt 157.0 lb

## 2011-12-28 DIAGNOSIS — C7952 Secondary malignant neoplasm of bone marrow: Secondary | ICD-10-CM

## 2011-12-28 DIAGNOSIS — C3430 Malignant neoplasm of lower lobe, unspecified bronchus or lung: Secondary | ICD-10-CM

## 2011-12-28 DIAGNOSIS — C3490 Malignant neoplasm of unspecified part of unspecified bronchus or lung: Secondary | ICD-10-CM

## 2011-12-28 DIAGNOSIS — M549 Dorsalgia, unspecified: Secondary | ICD-10-CM

## 2011-12-28 DIAGNOSIS — C7951 Secondary malignant neoplasm of bone: Secondary | ICD-10-CM

## 2011-12-28 LAB — CBC WITH DIFFERENTIAL (CANCER CENTER ONLY)
BASO#: 0.1 10*3/uL (ref 0.0–0.2)
HCT: 40 % (ref 34.8–46.6)
HGB: 13.6 g/dL (ref 11.6–15.9)
LYMPH#: 1.2 10*3/uL (ref 0.9–3.3)
MCHC: 34 g/dL (ref 32.0–36.0)
MCV: 91 fL (ref 81–101)
MONO#: 0.9 10*3/uL (ref 0.1–0.9)
NEUT%: 72.9 % (ref 39.6–80.0)
WBC: 9.5 10*3/uL (ref 3.9–10.0)

## 2011-12-28 MED ORDER — ZOLEDRONIC ACID 4 MG/5ML IV CONC
4.0000 mg | Freq: Once | INTRAVENOUS | Status: AC
  Administered 2011-12-28: 4 mg via INTRAVENOUS
  Filled 2011-12-28: qty 5

## 2011-12-28 MED ORDER — SODIUM CHLORIDE 0.9 % IV SOLN
Freq: Once | INTRAVENOUS | Status: AC
  Administered 2011-12-28: 15:00:00 via INTRAVENOUS

## 2011-12-28 NOTE — Patient Instructions (Signed)
Zoledronic Acid injection (Hypercalcemia, Oncology) What is this medicine? ZOLEDRONIC ACID (ZOE le dron ik AS id) lowers the amount of calcium loss from bone. It is used to treat too much calcium in your blood from cancer. It is also used to prevent complications of cancer that has spread to the bone. This medicine may be used for other purposes; ask your health care provider or pharmacist if you have questions. What should I tell my health care provider before I take this medicine? They need to know if you have any of these conditions: -aspirin-sensitive asthma -dental disease -kidney disease -an unusual or allergic reaction to zoledronic acid, other medicines, foods, dyes, or preservatives -pregnant or trying to get pregnant -breast-feeding How should I use this medicine? This medicine is for infusion into a vein. It is given by a health care professional in a hospital or clinic setting. Talk to your pediatrician regarding the use of this medicine in children. Special care may be needed. Overdosage: If you think you have taken too much of this medicine contact a poison control center or emergency room at once. NOTE: This medicine is only for you. Do not share this medicine with others. What if I miss a dose? It is important not to miss your dose. Call your doctor or health care professional if you are unable to keep an appointment. What may interact with this medicine? -certain antibiotics given by injection -NSAIDs, medicines for pain and inflammation, like ibuprofen or naproxen -some diuretics like bumetanide, furosemide -teriparatide -thalidomide This list may not describe all possible interactions. Give your health care provider a list of all the medicines, herbs, non-prescription drugs, or dietary supplements you use. Also tell them if you smoke, drink alcohol, or use illegal drugs. Some items may interact with your medicine. What should I watch for while using this medicine? Visit  your doctor or health care professional for regular checkups. It may be some time before you see the benefit from this medicine. Do not stop taking your medicine unless your doctor tells you to. Your doctor may order blood tests or other tests to see how you are doing. Women should inform their doctor if they wish to become pregnant or think they might be pregnant. There is a potential for serious side effects to an unborn child. Talk to your health care professional or pharmacist for more information. You should make sure that you get enough calcium and vitamin D while you are taking this medicine. Discuss the foods you eat and the vitamins you take with your health care professional. Some people who take this medicine have severe bone, joint, and/or muscle pain. This medicine may also increase your risk for a broken thigh bone. Tell your doctor right away if you have pain in your upper leg or groin. Tell your doctor if you have any pain that does not go away or that gets worse. What side effects may I notice from receiving this medicine? Side effects that you should report to your doctor or health care professional as soon as possible: -allergic reactions like skin rash, itching or hives, swelling of the face, lips, or tongue -anxiety, confusion, or depression -breathing problems -changes in vision -feeling faint or lightheaded, falls -jaw burning, cramping, pain -muscle cramps, stiffness, or weakness -trouble passing urine or change in the amount of urine Side effects that usually do not require medical attention (report to your doctor or health care professional if they continue or are bothersome): -bone, joint, or muscle pain -  fever -hair loss -irritation at site where injected -loss of appetite -nausea, vomiting -stomach upset -tired This list may not describe all possible side effects. Call your doctor for medical advice about side effects. You may report side effects to FDA at  1-800-FDA-1088. Where should I keep my medicine? This drug is given in a hospital or clinic and will not be stored at home. NOTE: This sheet is a summary. It may not cover all possible information. If you have questions about this medicine, talk to your doctor, pharmacist, or health care provider.  2012, Elsevier/Gold Standard. (07/01/2010 9:06:58 AM) 

## 2011-12-28 NOTE — Progress Notes (Signed)
Diagnosis: Metastatic adenocarcinoma of the lung-EGFR positive.  Current therapy: #1 Tarceva 125 mg by mouth daily. #2 Zometa 4 mg IV monthly.  Interim history: Ms. [name] presents today for an office followup visit.  She just recently had radiofrequency ablation and kyphoplasty on a compression fracture of her L3 lumbar vertebrae.  This was done back on 12/13/2011.   Unfortunately, she still reports, that she is having some pain.  I think at this point.  We are really adding impasse as what else more to do.  She is on Vicodin.  We did discuss utilizing a long-acting narcotic, however, she would really like to defer this for the time being.  We discussed utilizing a back brace.  She didn't like this idea and is going to contact [employer] medical [employer] company.  She states her back pain is more when she stands for long period of time or walks for a period of time.  She was having some diarrhea, however, she, reports, that that his premature resolved.  She was able to go to [place] in December to see her son graduate.  She does have some nausea, but utilizes Phenergan as needed.  She has a good appetite.  She denies any active, vomiting, any constipation, chest pain, shortness of breath, or cough.  She denies any fevers, chills, or night sweats.  She denies any headaches, visual changes, or rashes.  She denies any obvious, or abnormal bleeding.  She denies any lower leg swelling.  She does have some dryness of her skin, secondary to the Tarceva.  She does have lotion at home, but has not been that compliant with it.  Review of Systems: Constitutional:Negative for malaise/fatigue, fever, chills, weight loss, diaphoresis, activity change, appetite change, and unexpected weight change.  HEENT: Negative for double vision, blurred vision, visual loss, ear pain, tinnitus, congestion, rhinorrhea, epistaxis sore throat or sinus disease, oral pain/lesion, tongue soreness Respiratory: Negative for cough, chest  tightness, shortness of breath, wheezing and stridor.  Cardiovascular: Negative for chest pain, palpitations, leg swelling, orthopnea, PND, DOE or claudication Gastrointestinal: Negative for nausea, vomiting, abdominal pain, diarrhea, constipation, blood in stool, melena, hematochezia, abdominal distention, anal bleeding, rectal pain, anorexia and hematemesis.  Genitourinary: Negative for dysuria, frequency, hematuria,  Musculoskeletal: Negative for myalgias, back pain, joint swelling, arthralgias and gait problem.  Skin: Negative for rash, color change, pallor and wound.  Neurological:. Negative for dizziness/light-headedness, tremors, seizures, syncope, facial asymmetry, speech difficulty, weakness, numbness, headaches and paresthesias.  Hematological: Negative for adenopathy. Does not bruise/bleed easily.  Psychiatric/Behavioral:  Negative for depression, no loss of interest in normal activity or change in sleep pattern.   Physical Exam: This is a pleasant, 56 year old, well-developed, well-nourished, white female, in no obvious distress Vitals: temperature 98.1 degrees, pulse 99, respiration 18, blood pressure 141/73, weight 157 pounds HEENT reveals a normocephalic, atraumatic skull, no scleral icterus, no oral lesions  Neck is supple without any cervical or supraclavicular adenopathy.  Lungs are clear to auscultation bilaterally. There are no wheezes, rales or rhonci Cardiac is regular rate and rhythm with a normal S1 and S2. There are no murmurs, rubs, or bruits.  Abdomen is soft with good bowel sounds, there is no palpable mass. There is no palpable hepatosplenomegaly. There is no palpable fluid wave.  Musculoskeletal no tenderness of the spine, ribs, or hips.  Extremities there are no clubbing, cyanosis, or edema.  Skin no petechia, purpura or ecchymosis Neurologic is nonfocal.  Laboratory Data: White count 9.5, hemoglobin 13.6, hematocrit 40.0,  platelets 489,000  Current Outpatient  Prescriptions on File Prior to Visit  Medication Sig Dispense Refill  . ALPRAZolam (XANAX) 0.25 MG tablet Take 0.25-0.5 mg by mouth every 8 (eight) hours as needed. For anxiety.      Marland Kitchen doxycycline (VIBRA-TABS) 100 MG tablet Take 100 mg by mouth Twice daily.      Marland Kitchen erlotinib (TARCEVA) 100 MG tablet Take 100 mg by mouth daily. 100 mg & 25 mg. = 125 mg total dose.      . estradiol (VIVELLE-DOT) 0.0375 MG/24HR Place 1 patch onto the skin 2 (two) times a week. Apply on Tuesday and on Friday      . gabapentin (NEURONTIN) 100 MG capsule 100 mg 3 (three) times daily. 2 tabs tid.      Marland Kitchen HYDROcodone-acetaminophen (NORCO/VICODIN) 5-325 MG per tablet Take 1 tablet by mouth every 6 (six) hours as needed. For pAin      . hydrocortisone cream 0.5 % Like to skin at bedtime every night starting the day before Tarceva.  60 g  6  . loperamide (IMODIUM) 2 MG capsule Take 2 mg by mouth 4 (four) times daily as needed. For diarhea      . medroxyPROGESTERone (PROVERA) 5 MG tablet Take 2.5 mg by mouth daily.      . mupirocin ointment (BACTROBAN) 2 % Apply topically 3 (three) times daily.  22 g  0  . Ondansetron (ZUPLENZ) 8 MG FILM Take 8 mg by mouth every 12 (twelve) hours as needed.  12 each  3  . promethazine (PHENERGAN) 12.5 MG tablet Take 12.5 mg by mouth daily as needed. for nausea      . pseudoephedrine (SUDAFED) 30 MG tablet Take 30 mg by mouth every 4 (four) hours as needed. For allergies      . Skin Protectants, Misc. (EUCERIN) cream Apply 1 application topically every morning.      . triamterene-hydrochlorothiazide (MAXZIDE-25) 37.5-25 MG per tablet Take 1 pill a day, IF NEEDED, for leg swelling  30 tablet  3   Assessment/Plan: This is a pleasant, 56 year old, white female, with the following issues:  #1.  Metastatic adenocarcinoma of the lung.  Her tumor is EGFR positive.  She has responded quite nicely to the Tarceva.  She will remain on her current dose of Tarceva.  #2.  Compression fracture of L3 lumbar  vertebrae.  She is status post radiofrequency ablation and kyphoplasty on 12/13/2011. Marland Kitchen  She still continues to have some back pain.  We did discuss utilizing long-acting or chronic.  However, she would like to defer that for the time being.  She will remain on her Vicodin.  She is going to try and utilize a back brace.  #3.  Supportive therapy.  She receives Zometa 4 mg IV monthly.  #4.  Followup.  We will follow back up with Ms. Willinger in one month, but before then should there be questions or concerns.

## 2011-12-29 ENCOUNTER — Ambulatory Visit
Admission: RE | Admit: 2011-12-29 | Discharge: 2011-12-29 | Disposition: A | Discharge status: Home / Self Care | Payer: BC Managed Care – PPO | Source: Ambulatory Visit | Attending: Radiation Oncology | Admitting: Radiation Oncology

## 2011-12-29 DIAGNOSIS — C7931 Secondary malignant neoplasm of brain: Secondary | ICD-10-CM

## 2011-12-29 MED ORDER — GADOBENATE DIMEGLUMINE 529 MG/ML IV SOLN
15.0000 mL | Freq: Once | INTRAVENOUS | Status: AC | PRN
  Administered 2011-12-29: 15 mL via INTRAVENOUS

## 2012-01-01 ENCOUNTER — Ambulatory Visit (HOSPITAL_COMMUNITY): Payer: BC Managed Care – PPO

## 2012-01-01 ENCOUNTER — Ambulatory Visit
Admission: RE | Admit: 2012-01-01 | Discharge: 2012-01-01 | Disposition: A | Discharge status: Home / Self Care | Payer: BC Managed Care – PPO | Source: Ambulatory Visit | Attending: Radiation Oncology | Admitting: Radiation Oncology

## 2012-01-01 ENCOUNTER — Other Ambulatory Visit (HOSPITAL_COMMUNITY): Payer: Self-pay | Admitting: Neurosurgery

## 2012-01-01 ENCOUNTER — Ambulatory Visit (HOSPITAL_COMMUNITY): Admission: RE | Admit: 2012-01-01 | Payer: BC Managed Care – PPO | Source: Ambulatory Visit

## 2012-01-01 ENCOUNTER — Encounter: Payer: Self-pay | Admitting: Radiation Oncology

## 2012-01-01 VITALS — BP 113/72 | HR 94 | Temp 97.9°F | Resp 20 | Wt 159.5 lb

## 2012-01-01 DIAGNOSIS — C7931 Secondary malignant neoplasm of brain: Secondary | ICD-10-CM

## 2012-01-01 DIAGNOSIS — Z923 Personal history of irradiation: Secondary | ICD-10-CM | POA: Insufficient documentation

## 2012-01-01 DIAGNOSIS — K589 Irritable bowel syndrome without diarrhea: Secondary | ICD-10-CM | POA: Insufficient documentation

## 2012-01-01 MED ORDER — VITAMIN E 180 MG (400 UNIT) PO CAPS: 400.0000 [IU] | ORAL_CAPSULE | Freq: Every day | ORAL | Status: DC

## 2012-01-01 MED ORDER — PENTOXIFYLLINE ER 400 MG PO TBCR: 400.0000 mg | EXTENDED_RELEASE_TABLET | Freq: Every day | ORAL | Status: DC

## 2012-01-01 NOTE — Addendum Note (Signed)
Encounter addended by: Glennie Hawk, RN on: 01/01/2012 10:40 AM<BR>     Documentation filed: Inpatient Document Flowsheet

## 2012-01-01 NOTE — Progress Notes (Signed)
Radiation Oncology         [phone] ________________________________  Name: [name] MRN: [id number]  Date: 01/01/2012  DOB: October 23, 1955  Follow-Up Visit Note  CC: [name], MD  [name], MD  Diagnosis:  Adenocarcinoma of the lung, with history of 4 brain metastases and bone metastases  Interval Since Last Radiation:  She completed stereotactic radiosurgery to the right frontal, right thalamic, left frontal and left occipital target, 20 Gray in 1 fraction to each target, on 07/19/11.   Narrative:  The patient returns today for routine follow-up. She has undergone restaging CT imaging of her chest abdomen and pelvis which revealed stable disease in her bones and lungs on 11/21/2011 . She continues Tarceva. She has some nausea with the Tarceva but this is relieved by Phenergan.  She had progressive back pain for which she underwent vertebral body radiofrequency ablation at L3-4 with vertebral body augmentation kyphoplasty on 11-27. Biopsy from the procedure revealed benign bone elements. She still has back pain, but it seems to be a little higher in her back than where the procedure was performed. It is tolerable.   She denies any new weakness, numbness, headaches, visual changes, dizziness, seizures, or other neurologic changes. She has minimal complaints today.  MRI of her brain on 12/29/11 was reviewed at brain tumor conference today. It revealed previously noted lesions:   1)Medial right thalamic 6 mm lesion more conspicuous on axial imaging (series 10 image 90) although without significant change on the sagittal imaging (series 12 image 18).   2) Tiny area of enhancement posterior superior medial left frontal lobe (series 10 image 134) without significant change.  3) Inferior right frontal lesion has increased in size now 8.1 x 4.9 x 5.6 mm versus prior maximal dimension of 4.7 mm (series 10 image 77).   4) Stable tiny area of enhancement right parietal lobe  (series 10 image 85) previously felt to be vascular however, small metastatic focus not excluded.  This was discussed at brain tumor conference and will continue to be followed as it is nonspecific.  No new lesion noted.   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         ALLERGIES:   has no known allergies.  Meds: Current Outpatient Prescriptions  Medication Sig Dispense Refill  . ALPRAZolam (XANAX) 0.25 MG tablet Take 0.25-0.5 mg by mouth every 8 (eight) hours as needed. For anxiety.      Marland Kitchen doxycycline (VIBRA-TABS) 100 MG tablet Take 100 mg by mouth Twice daily.      Marland Kitchen erlotinib (TARCEVA) 100 MG tablet Take 100 mg by mouth daily. 100 mg & 25 mg. = 125 mg total dose.      . estradiol (VIVELLE-DOT) 0.0375 MG/24HR Place 1 patch onto the skin 2 (two) times a week. Apply on Tuesday and on Friday      . gabapentin (NEURONTIN) 100 MG capsule 100 mg 3 (three) times daily. 2 tabs tid.      Marland Kitchen HYDROcodone-acetaminophen (NORCO/VICODIN) 5-325 MG per tablet Take 1 tablet by mouth every 6 (six) hours as needed. For pAin      . hydrocortisone cream 0.5 % Like to skin at bedtime every night starting the day before Tarceva.  60 g  6  . loperamide (IMODIUM) 2 MG capsule Take 2 mg by mouth 4 (four) times daily as needed. For diarhea      . medroxyPROGESTERone (PROVERA) 5 MG tablet Take 2.5 mg by mouth daily.      . mupirocin ointment (BACTROBAN) 2 % Apply topically 3 (three) times daily.  22 g  0  . Ondansetron (ZUPLENZ) 8 MG FILM Take 8 mg by mouth every 12 (twelve) hours as needed.  12 each  3  . promethazine (PHENERGAN) 12.5 MG tablet Take 12.5 mg by mouth daily as needed. for nausea      . pseudoephedrine (SUDAFED) 30 MG tablet Take 30 mg by mouth every 4 (four) hours as needed. For allergies      . Skin  Protectants, Misc. (EUCERIN) cream Apply 1 application topically every morning.      . triamterene-hydrochlorothiazide (MAXZIDE-25) 37.5-25 MG per tablet Take 1 pill a day, IF NEEDED, for leg swelling  30 tablet  3    Physical Findings: The patient is in no acute distress. Patient is alert and oriented.  weight is 159 lb 8 oz (72.349 kg). Her oral temperature is 97.9 F (36.6 C). Her blood pressure is 113/72 and her pulse is 94. Her respiration is 20 and oxygen saturation is 98%. .  No significant changes. General: Alert and oriented, in no acute distress HEENT:  Pupils are equally round and reactive to light. Extraocular movements are intact. Oropharynx is clear. Neck: Neck is supple, no palpable cervical or supraclavicular lymphadenopathy. Heart: Regular in rate and rhythm with no murmurs, rubs, or gallops. Chest: Clear to auscultation bilaterally, with no rhonchi, wheezes, or rales. Skin: No concerning lesions. Musculoskeletal: symmetric strength and muscle tone throughout. No tenderness to palpation through the spine Neurologic: Cranial nerves II through XII are grossly intact. No obvious focalities. Speech is fluent. Coordination is intact. Reflexes intact throughout her arms and legs Psychiatric: Judgment and insight are intact. Affect is appropriate.   Lab Findings: Lab Results  Component Value Date   WBC 9.5 12/28/2011   HGB 13.6 12/28/2011   HCT 40.0 12/28/2011   MCV 91 12/28/2011   PLT 489* 12/28/2011    CMP     Component Value Date/Time   NA 141 12/13/2011 0755   NA 143 11/28/2011 1116   K 3.5 12/13/2011 0755   K 3.6 11/28/2011 1116   CL 105 12/13/2011 0755   CL 99 11/28/2011 1116   CO2 27 12/13/2011 0755   CO2 28 11/28/2011 1116   GLUCOSE 88 12/13/2011 0755   GLUCOSE 78 11/28/2011 1116   BUN 15 12/13/2011 0755   BUN 18 11/28/2011 1116   CREATININE 0.68 12/13/2011 0755   CREATININE 0.7 11/28/2011 1116   CALCIUM 9.0 12/13/2011 0755   CALCIUM 9.1 11/28/2011  1116   PROT 5.6* 12/13/2011 0755   PROT 6.8 11/28/2011 1116   ALBUMIN 2.8* 12/13/2011 0755   AST 28 12/13/2011 0755   AST 31 11/28/2011 1116   ALT 14 12/13/2011 0755   ALKPHOS 131* 12/13/2011 0755   ALKPHOS 138* 11/28/2011 1116   BILITOT 0.4 12/13/2011 0755   BILITOT 0.80 11/28/2011 1116   GFRNONAA >90 12/13/2011 0755   GFRAA >90 12/13/2011 0755     Radiographic Findings: Mr Laqueta Jean Wo Contrast  12/29/2011  *RADIOLOGY REPORT*  Clinical Data: Metastatic lung cancer.  Post stereotactic radiation therapy for brain metastatic disease.  Follow-up restaging.  MRI HEAD WITHOUT AND WITH CONTRAST  Technique:  Multiplanar, multiecho pulse sequences  of the brain and surrounding structures were obtained according to standard protocol without and with intravenous contrast  Contrast: 15mL MULTIHANCE GADOBENATE DIMEGLUMINE 529 MG/ML IV SOLN  Comparison: 07/22/2011, 07/13/2011 and 07/07/2011 brain MR  Findings: Previously noted lesions:  Medial right thalamic 6 mm lesion more conspicuous on axial imaging (series 10 image 90) although without significant change on the sagittal imaging (series 12 image 18).  Tiny area of enhancement posterior superior medial left frontal lobe (series 10 image 134) without significant change.  Inferior right frontal lesion has increased in size now 8.1 x 4.9 x 5.6 mm versus prior maximal dimension of 4.7 mm (series 10 image 77).  Stable tiny area of enhancement right parietal lobe (series 10 image 85) previously felt to be vascular however, small metastatic focus not excluded.  No new lesion noted.  No acute infarct.  No intracranial hemorrhage.  Mild atrophy without hydrocephalus.  Mild white matter type changes.  Major intracranial vascular structures are patent with small right vertebral artery once again noted.  Minimal mucosal thickening paranasal sinus with polypoid opacification inferior left maxillary sinus.  IMPRESSION: Previously noted lesions:  Medial right thalamic 6 mm  lesion more conspicuous on axial imaging (series 10 image 90) although without significant change on the sagittal imaging (series 12 image 18).  Tiny area of enhancement posterior superior medial left frontal lobe (series 10 image 134) without significant change.  Inferior right frontal lesion has increased in size now 8.1 x 4.9 x 5.6 mm versus prior maximal dimension of 4.7 mm (series 10 image 77).  Stable tiny area of enhancement right parietal lobe (series 10 image 85) previously felt to be vascular however, small metastatic focus not excluded.  No new lesion noted.   Original Report Authenticated By: Lacy Duverney, M.D.    Ir Kyphoplasty Or Sacroplasty  12/18/2011  *RADIOLOGY REPORT*  Clinical Data: New onset low back pain. Pathologic compression fracture at L3.  LUMBAR KYPHOPLASTY AT L3 PRECEDED BY RADIOFREQUENCY TUMOR ABLATION  Comparison: MRI of the lumbosacral spine of 11/21/2011.  Following a full explanation of the procedure along with the potential associated complications, an informed witnessed consent was obtained.  The patient was laid prone on the fluoroscopic table.  The skin overlying the lumbar region was then prepped and draped in the usual sterile fashion.  The skin overlying the right pedicle was then infiltrated with 0.25% bupivacaine and carried to the right pedicle.  Using biplane intermittent fluoroscopy, a 10.5-gauge DFine spine needle was advanced into the posterior third at L3.  Through this, a DFine core biopsy device was then advanced and positioned distal to the tip of the working needle.  Using a 20 ml syringe, a core biopsy was obtained and sent for pathologic analysis.  Through the working cannula, a DFine radiofrequency ablation device was then advanced and manipulated in different directions to ablate the tumor.  Temperatures achieved were 51 degrees Celsius to 53 degrees Celsius at the distal aspect of the radiofrequency ablation device.  This was then removed.  The needle was  then advanced to the junction of the anterior and middle thirds at L3.  At this time methylmethacrylate mixture was reconstituted with Tobramycin in the DFine mixing system.  This was then loaded onto the DFine injector device.  The device was then locked into position at the hub of the working needle.  Using biplane intermittent fluoroscopy, pulsed delivery of the methylmethacrylate mixture was then performed with excellent filling obtained of the ablated volume of the  vertebral body.  There was no extrusion noted into disc spaces or posteriorly into the spinal canal.  No paraspinous venous contamination was seen  The working needle and the injector device were then removed. Hemostasis was achieved at the skin entry site.  The patient tolerated the procedure well.  There were no acute complications.  Medications utilized: Versed 4 mg IV.  Fentanyl 100 mcg IV. Dilaudid 1 mg IV.  IMPRESSION: 1.  Status post fluoroscopic-guided needle placement for deep core bone biopsy at L3. 2.  Status post vertebral body radiofrequency ablation at L3-4, followed by methylmethacrylate augmentation using the DFine kyphoplasty system.   Original Report Authenticated By: Julieanne Cotton, M.D.     Impression/Plan:  Overall, she is doing very well. Systemic disease is stable.  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  Therefore, she will start Trental + Vit E at 400mg /400 IU daily, respectively, x 1 wk, then 400mg /400 IU BID for the next 3 months. In 3 months she will undergo another MRI of her brain with followup in my clinic.  It was a pleasure seeing Ms. Ford and her husband and they know to call if they have any issues in the interim.   _____________________________________   Lonie Peak, MD

## 2012-01-01 NOTE — Progress Notes (Addendum)
Pt denies HA, vision changes, unsteadiness, dizziness, fatigue, loss of appetite. She does report n/v several x weekly; she attributes it to Tarceva. Phenergan prn w/good relief. She has some mild mid to lower back pain; Hydrocodone prn. Pt is not on any steroid therapy at this time.

## 2012-01-04 ENCOUNTER — Ambulatory Visit
Admission: RE | Admit: 2012-01-04 | Discharge: 2012-01-04 | Disposition: A | Discharge status: Home / Self Care | Payer: BC Managed Care – PPO | Source: Ambulatory Visit | Attending: Nurse Practitioner | Admitting: Nurse Practitioner

## 2012-01-04 ENCOUNTER — Other Ambulatory Visit: Payer: Self-pay | Admitting: Nurse Practitioner

## 2012-01-04 DIAGNOSIS — C50919 Malignant neoplasm of unspecified site of unspecified female breast: Secondary | ICD-10-CM

## 2012-01-04 DIAGNOSIS — N63 Unspecified lump in unspecified breast: Secondary | ICD-10-CM

## 2012-01-04 HISTORY — DX: Malignant neoplasm of unspecified site of unspecified female breast: C50.919

## 2012-01-08 ENCOUNTER — Other Ambulatory Visit: Payer: Self-pay | Admitting: Radiation Therapy

## 2012-01-08 DIAGNOSIS — C7931 Secondary malignant neoplasm of brain: Secondary | ICD-10-CM

## 2012-01-15 ENCOUNTER — Ambulatory Visit (INDEPENDENT_AMBULATORY_CARE_PROVIDER_SITE_OTHER): Payer: BC Managed Care – PPO | Admitting: General Surgery

## 2012-01-15 ENCOUNTER — Encounter (INDEPENDENT_AMBULATORY_CARE_PROVIDER_SITE_OTHER): Payer: Self-pay | Admitting: General Surgery

## 2012-01-15 VITALS — BP 142/90 | HR 114 | Temp 97.8°F | Resp 16 | Ht 60.5 in | Wt 157.6 lb

## 2012-01-15 DIAGNOSIS — C50419 Malignant neoplasm of upper-outer quadrant of unspecified female breast: Secondary | ICD-10-CM

## 2012-01-15 NOTE — Patient Instructions (Addendum)
You have  been found to have a 1.4 cm diameter invasive lobular carcinoma of the right breast in the upper outer quadrant. Estrogen receptor is strongly positive. Herceptin receptor is negative.  We are going to hold off on any surgery at this point in time because of your lung cancer.  You will see Dr. Myna Hidalgo in early January. He will discuss the option of primary antiestrogen therapy to treat your breast cancer.  Return to see Dr. Derrell Lolling in 3 months, sooner if anything changes.

## 2012-01-15 NOTE — Progress Notes (Signed)
Patient ID: [name], female   DOB: 12-11-1955, 56 y.o.   MRN: [id number]  Chief Complaint  Patient presents with  . Breast Cancer    eval rt breast ca    HPI [name] is a 56 y.o. female.  She is referred by Dr. Juel Burrow at the [facility]  for evaluation and management of a newly diagnosed invasive lobular carcinoma of the right breast, upper outer quadrant.  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 She has had radiation therapy to the brain. She has had multiple kyphoplasties. Her health is stable and she is not in decline at this time.  She recently saw Dr. Tresa Res and there were some lumpiness of the left breast. Bilateral mammogram showed that the left breast looked fine and was stable but there was a new spiculated mass in the right breast upper outer quadrant, 1.4 cm by ultrasound. MRI has not been done. Biopsy of the right breast mass shows invasive lobular carcinoma, ER 100%, PR 81%, Ki-67 8%, HER-2 negative.  I discussed her breast cancer with Dr. Myna Hidalgo on the phone last week but we were not aware of her breast diagnostic profile at that time. The patient is here with her husband today.  Family history is negative for breast or ovarian cancer. HPI  Past Medical History  Diagnosis Date  . Pulmonary nodules 06/22/11    per CT scan  . IBS (irritable bowel syndrome)   . Fibroadenoma of breast     hx of  . Hx of radiation therapy 07/05/11 -07/21/11    LS, Tspine  . Hx of radiation therapy 07/19/11    brain, SRS  . Headache   . Metastasis of unknown origin   . Adenocarcinoma of lung   . 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 Breast cancer     2013    Past Surgical History  Procedure Date  . Lung biopsy   . Vertebroplasty   . Vertebroplasty   . Breast biopsy 2013    right breast    Family History  Problem Relation Age of Onset  . Cancer Mother     cervical  . Asthma Father    . Heart disease Maternal Grandmother   . Cancer Maternal Grandfather     skin  . Cancer Paternal Grandmother     pancreatic  . Heart disease Paternal Grandfather     Social History History  Substance Use Topics  . Smoking status: Never Smoker   . Smokeless tobacco: Never Used  . Alcohol Use: No     Comment: rarely    No Known Allergies  Current Outpatient Prescriptions  Medication Sig Dispense Refill  . ALPRAZolam (XANAX) 0.25 MG tablet Take 0.25-0.5 mg by mouth every 8 (eight) hours as needed. For anxiety.      . diphenhydrAMINE (BENADRYL) 25 MG tablet Take 25 mg by mouth every 6 (six) hours as needed.      . doxycycline (VIBRA-TABS) 100 MG tablet Take 100 mg by mouth Twice daily.      Marland Kitchen erlotinib (TARCEVA) 100 MG tablet Take 100 mg by mouth daily. 100 mg & 25 mg. = 125 mg total dose.      . gabapentin (NEURONTIN) 100 MG capsule 100 mg 3 (three) times daily. 2 tabs tid.      Marland Kitchen HYDROcodone-acetaminophen (NORCO/VICODIN) 5-325 MG per tablet Take 1 tablet by mouth every 6 (six) hours  as needed. For pAin      . hydrocortisone cream 0.5 % Like to skin at bedtime every night starting the day before Tarceva.  60 g  6  . ibuprofen (ADVIL,MOTRIN) 200 MG tablet Take 200 mg by mouth every 6 (six) hours as needed.      . loperamide (IMODIUM) 2 MG capsule Take 2 mg by mouth 4 (four) times daily as needed. For diarhea      . pentoxifylline (TRENTAL) 400 MG CR tablet Take 1 tablet (400 mg total) by mouth daily. After one week, take 1 tablet twice a day.  60 tablet  5  . promethazine (PHENERGAN) 12.5 MG tablet Take 12.5 mg by mouth daily as needed. for nausea      . pseudoephedrine (SUDAFED) 30 MG tablet Take 30 mg by mouth every 4 (four) hours as needed. For allergies      . Skin Protectants, Misc. (EUCERIN) cream Apply 1 application topically every morning.      . triamterene-hydrochlorothiazide (MAXZIDE-25) 37.5-25 MG per tablet Take 1 pill a day, IF NEEDED, for leg swelling  30 tablet  3  .  vitamin E (VITAMIN E) 400 UNIT capsule Take 1 capsule (400 Units total) by mouth daily. After 1 week, take 1 capsule twice daily.  60 capsule  5    Review of Systems Review of Systems  Constitutional: Negative for fever, chills and unexpected weight change.  HENT: Negative for hearing loss, congestion, sore throat, trouble swallowing and voice change.   Eyes: Negative for visual disturbance.  Respiratory: Negative for cough and wheezing.   Cardiovascular: Negative for chest pain, palpitations and leg swelling.  Gastrointestinal: Negative for nausea, vomiting, abdominal pain, diarrhea, constipation, blood in stool, abdominal distention and anal bleeding.  Genitourinary: Negative for hematuria, vaginal bleeding and difficulty urinating.  Musculoskeletal: Positive for back pain and arthralgias.  Skin: Negative for rash and wound.  Neurological: Negative for seizures, syncope and headaches.  Hematological: Negative for adenopathy. Does not bruise/bleed easily.  Psychiatric/Behavioral: Negative for confusion.    Blood pressure 142/90, pulse 114, temperature 97.8 F (36.6 C), temperature source Temporal, resp. rate 16, height 5' 0.5" (1.537 m), weight 157 lb 9.6 oz (71.487 kg).  Physical Exam Physical Exam  Constitutional: She is oriented to person, place, and time. She appears well-developed and well-nourished. No distress.       She looks well and is not appear to be chronically ill or in distress.  HENT:  Head: Normocephalic and atraumatic.  Nose: Nose normal.  Mouth/Throat: No oropharyngeal exudate.  Eyes: Conjunctivae normal and EOM are normal. Pupils are equal, round, and reactive to light. Left eye exhibits no discharge. No scleral icterus.  Neck: Neck supple. No JVD present. No tracheal deviation present. No thyromegaly present.  Cardiovascular: Normal rate, regular rhythm, normal heart sounds and intact distal pulses.   No murmur heard. Pulmonary/Chest: Effort normal and breath  sounds normal. No respiratory distress. She has no wheezes. She has no rales. She exhibits no tenderness.       Ecchymoses right breast, upper outer quadrant. Suggestion of slight fullness upper-outer quadrant but no large or dominant mass. No other skin changes in either breast. No axillary adenopathy. No supraclavicular adenopathy.  Abdominal: Soft. Bowel sounds are normal. She exhibits no distension and no mass. There is no tenderness. There is no rebound and no guarding.  Musculoskeletal: She exhibits no edema and no tenderness.  Lymphadenopathy:    She has no cervical adenopathy.  Neurological: She  is alert and oriented to person, place, and time. She exhibits normal muscle tone. Coordination normal.  Skin: Skin is warm. No rash noted. She is not diaphoretic. No erythema. No pallor.  Psychiatric: She has a normal mood and affect. Her behavior is normal. Judgment and thought content normal.    Data Reviewed Notes from the cancer center. Mammograms and ultrasounds. Pathology report.  Assessment    Invasive lobular carcinoma right breast, upper outer quadrant, receptor positive, HER-2-negative. Clinical stage T1c, N0.  Lung cancer with metastasis to spine and brain, status post multiple interventions.  Almost certainly, her stage IV lung cancer will determine her overall prognosis.    Plan    I had a long talk with the patient and her husband. I told them that we would like to palliate her breast cancer and prevent it from growing, shrink it if possible, and certainly prevent erosion into the skin. Antiestrogen therapy may be able to accomplish all of this without surgical intervention.  They are aware that her prognosis will be defined by her response to therapy for her lung cancer, and not by her breast cancer.  We talked about options for treatment of her breast cancer including observation only, primary antiestrogen therapy and close clinical followup, and  lumpectomy.  I believe  that mastectomy would be over- treatment for local control.  They are hoping that she can be treated with simply antiestrogen therapy alone and keep the tumor from growing or possibly shrink it.  She will be referred back to Dr. Arlan Organ, and she actually has an appointment to see him in the next 2 weeks. We will need his opinion and approval for primary antiestrogen therapy.  She will return to see me in 3 months, sooner if anything changes. She is aware that we can always consider lumpectomy if the tumor does not respond antiestrogen therapy.       Angelia Mould. Derrell Lolling, M.D., [facility] General and Minimally invasive Surgery Breast and Colorectal Surgery Office:   [phone] Pager:   [phone]  01/15/2012, 4:48 PM

## 2012-01-22 ENCOUNTER — Telehealth: Payer: Self-pay | Admitting: *Deleted

## 2012-01-22 ENCOUNTER — Encounter: Payer: Self-pay | Admitting: Radiation Oncology

## 2012-01-22 NOTE — Telephone Encounter (Signed)
Returned [name]'s call and she relayed that since she has been taking Trental that she is experiencing nausea, vomiting and diarrhea.  The side-effect profile for Trental includes Nausea and vomiting and Ms. Soliday states that Dr. Basilio Cairo informed her that she may experience N&V once starting this medication but diarrhea is not on the side-effect profile.. She denies any fever presently.  Will call once Dr. Basilio Cairo informed.

## 2012-01-22 NOTE — Progress Notes (Signed)
Ms. Salmons called to report  that since she has been taking Trental that she is experiencing nausea, vomiting and diarrhea.  She denies any fever presently. She has had GI upset from Tarceva but not recently. She denies escalating her dose of Tarceva recently. The GI upset started when she began taking the vitamin E and Trental twice a day.  I recommended that she continue the vitamin E twice a day but go back to Trental once a day and take it with meals. She will call me in a week if her symptoms have not improved significantly.  -----------------------------------  Lonie Peak, MD

## 2012-01-23 ENCOUNTER — Other Ambulatory Visit: Payer: Self-pay | Admitting: Hematology & Oncology

## 2012-01-24 ENCOUNTER — Ambulatory Visit (HOSPITAL_BASED_OUTPATIENT_CLINIC_OR_DEPARTMENT_OTHER): Payer: BC Managed Care – PPO

## 2012-01-24 ENCOUNTER — Ambulatory Visit (HOSPITAL_BASED_OUTPATIENT_CLINIC_OR_DEPARTMENT_OTHER): Payer: BC Managed Care – PPO | Admitting: Hematology & Oncology

## 2012-01-24 ENCOUNTER — Other Ambulatory Visit (HOSPITAL_BASED_OUTPATIENT_CLINIC_OR_DEPARTMENT_OTHER): Payer: BC Managed Care – PPO | Admitting: Lab

## 2012-01-24 VITALS — BP 126/62 | HR 90 | Temp 97.9°F | Resp 16 | Ht 61.0 in | Wt 157.0 lb

## 2012-01-24 DIAGNOSIS — C3490 Malignant neoplasm of unspecified part of unspecified bronchus or lung: Secondary | ICD-10-CM

## 2012-01-24 DIAGNOSIS — C3430 Malignant neoplasm of lower lobe, unspecified bronchus or lung: Secondary | ICD-10-CM

## 2012-01-24 DIAGNOSIS — C7951 Secondary malignant neoplasm of bone: Secondary | ICD-10-CM

## 2012-01-24 DIAGNOSIS — C50419 Malignant neoplasm of upper-outer quadrant of unspecified female breast: Secondary | ICD-10-CM

## 2012-01-24 DIAGNOSIS — C7952 Secondary malignant neoplasm of bone marrow: Secondary | ICD-10-CM

## 2012-01-24 DIAGNOSIS — Z170 Estrogen receptor positive status [ER+]: Secondary | ICD-10-CM

## 2012-01-24 DIAGNOSIS — C50919 Malignant neoplasm of unspecified site of unspecified female breast: Secondary | ICD-10-CM

## 2012-01-24 LAB — CMP (CANCER CENTER ONLY)
Albumin: 3.1 g/dL — ABNORMAL LOW (ref 3.3–5.5)
BUN, Bld: 19 mg/dL (ref 7–22)
Calcium: 8.6 mg/dL (ref 8.0–10.3)
Chloride: 97 mEq/L — ABNORMAL LOW (ref 98–108)
Glucose, Bld: 94 mg/dL (ref 73–118)
Potassium: 3.7 mEq/L (ref 3.3–4.7)

## 2012-01-24 LAB — CBC WITH DIFFERENTIAL (CANCER CENTER ONLY)
BASO#: 0 10*3/uL (ref 0.0–0.2)
Eosinophils Absolute: 0.2 10*3/uL (ref 0.0–0.5)
HCT: 37.6 % (ref 34.8–46.6)
HGB: 12.6 g/dL (ref 11.6–15.9)
LYMPH#: 1 10*3/uL (ref 0.9–3.3)
MCH: 30.5 pg (ref 26.0–34.0)
MONO%: 7.3 % (ref 0.0–13.0)
NEUT#: 6.7 10*3/uL — ABNORMAL HIGH (ref 1.5–6.5)
RBC: 4.13 10*6/uL (ref 3.70–5.32)

## 2012-01-24 MED ORDER — ZOLEDRONIC ACID 4 MG/100ML IV SOLN
4.0000 mg | Freq: Once | INTRAVENOUS | Status: AC
  Administered 2012-01-24: 4 mg via INTRAVENOUS
  Filled 2012-01-24: qty 100

## 2012-01-24 MED ORDER — EXEMESTANE 25 MG PO TABS: 25.0000 mg | ORAL_TABLET | Freq: Every day | ORAL | Status: DC

## 2012-01-24 MED ORDER — ZOLEDRONIC ACID 4 MG/5ML IV CONC
4.0000 mg | Freq: Once | INTRAVENOUS | Status: DC
  Filled 2012-01-24: qty 5

## 2012-01-24 MED ORDER — HYDROCODONE-ACETAMINOPHEN 10-325 MG PO TABS: 1.0000 | ORAL_TABLET | Freq: Four times a day (QID) | ORAL | Status: DC | PRN

## 2012-01-24 NOTE — Patient Instructions (Addendum)
Zoledronic Acid injection (Hypercalcemia, Oncology) What is this medicine? ZOLEDRONIC ACID (ZOE le dron ik AS id) lowers the amount of calcium loss from bone. It is used to treat too much calcium in your blood from cancer. It is also used to prevent complications of cancer that has spread to the bone. This medicine may be used for other purposes; ask your health care provider or pharmacist if you have questions. What should I tell my health care provider before I take this medicine? They need to know if you have any of these conditions: -aspirin-sensitive asthma -dental disease -kidney disease -an unusual or allergic reaction to zoledronic acid, other medicines, foods, dyes, or preservatives -pregnant or trying to get pregnant -breast-feeding How should I use this medicine? This medicine is for infusion into a vein. It is given by a health care professional in a hospital or clinic setting. Talk to your pediatrician regarding the use of this medicine in children. Special care may be needed. Overdosage: If you think you have taken too much of this medicine contact a poison control center or emergency room at once. NOTE: This medicine is only for you. Do not share this medicine with others. What if I miss a dose? It is important not to miss your dose. Call your doctor or health care professional if you are unable to keep an appointment. What may interact with this medicine? -certain antibiotics given by injection -NSAIDs, medicines for pain and inflammation, like ibuprofen or naproxen -some diuretics like bumetanide, furosemide -teriparatide -thalidomide This list may not describe all possible interactions. Give your health care provider a list of all the medicines, herbs, non-prescription drugs, or dietary supplements you use. Also tell them if you smoke, drink alcohol, or use illegal drugs. Some items may interact with your medicine. What should I watch for while using this medicine? Visit  your doctor or health care professional for regular checkups. It may be some time before you see the benefit from this medicine. Do not stop taking your medicine unless your doctor tells you to. Your doctor may order blood tests or other tests to see how you are doing. Women should inform their doctor if they wish to become pregnant or think they might be pregnant. There is a potential for serious side effects to an unborn child. Talk to your health care professional or pharmacist for more information. You should make sure that you get enough calcium and vitamin D while you are taking this medicine. Discuss the foods you eat and the vitamins you take with your health care professional. Some people who take this medicine have severe bone, joint, and/or muscle pain. This medicine may also increase your risk for a broken thigh bone. Tell your doctor right away if you have pain in your upper leg or groin. Tell your doctor if you have any pain that does not go away or that gets worse. What side effects may I notice from receiving this medicine? Side effects that you should report to your doctor or health care professional as soon as possible: -allergic reactions like skin rash, itching or hives, swelling of the face, lips, or tongue -anxiety, confusion, or depression -breathing problems -changes in vision -feeling faint or lightheaded, falls -jaw burning, cramping, pain -muscle cramps, stiffness, or weakness -trouble passing urine or change in the amount of urine Side effects that usually do not require medical attention (report to your doctor or health care professional if they continue or are bothersome): -bone, joint, or muscle pain -  fever -hair loss -irritation at site where injected -loss of appetite -nausea, vomiting -stomach upset -tired This list may not describe all possible side effects. Call your doctor for medical advice about side effects. You may report side effects to FDA at  1-800-FDA-1088. Where should I keep my medicine? This drug is given in a hospital or clinic and will not be stored at home. NOTE: This sheet is a summary. It may not cover all possible information. If you have questions about this medicine, talk to your doctor, pharmacist, or health care provider.  2012, [employer]. (07/01/2010 9:06:58 AM) 

## 2012-01-24 NOTE — Progress Notes (Signed)
This office note has been dictated.

## 2012-01-25 NOTE — Progress Notes (Signed)
CC:   [name], M.D.  DIAGNOSIS: 1. Metastatic adenocarcinoma of the lung, EGFR positive. 2. Invasive lobular carcinoma of the right breast.  CURRENT THERAPY: 1. Tarceva 125 mg p.o. daily. 2. Patient to start Aromasin 25 mg p.o. daily. 3. Zometa 4 mg IV q.4 weeks.  INTERIM HISTORY:  [name] comes in for followup.  Shockingly enough, she now has breast cancer.  She had a mammogram done in December.  This actually was for a lump in the left breast. Surprisingly enough, she was found to have an abnormality in the right breast.  There was a spiculated mass in the right breast.  This measured 1.2 x 0.9 x 1.4 cm.  This was biopsied by Radiology.  The pathology report ([id number]) showed an invasive lobular carcinoma.  It was strongly ER positive and PR positive.  It was HER-2 negative.  I spoke with Dr. Derrell Lolling.  She referred to Dr. Derrell Lolling.  Given that she has extensive metastatic lung cancer, I did not think that putting her through a surgical procedure would be in her best interest.  I felt that we could probably try hormonal therapy on her and see if we can get a response this way.  Her prognosis clearly is dictated by the lung cancer, and this is what we need to be more focused on.  Dr. Derrell Lolling was okay with this and will certainly see her back if necessary.  [name] is doing okay.  She is still having some back issues.  She is on Vicodin, which does help.  I want to try to get her on the long- acting, Vicodin, which was approved by the FDA in October.  This is called as Zohydro.  I want to put her on 20 mg twice a day. Unfortunately, I found out that this is not going to be available until March.  She has had no headache.  She was put on Trental and vitamin E by Dr. 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  She has had a good appetite.  She enjoyed the holidays.  She had a great time down in [place] to see her son graduate in December.  She is still working.   She enjoys work.  It does tire her out a little bit.  She has had no problems with bowels or bladder.  She has had no real skin issues with the Tarceva.  There has been no double vision or blurred vision.  She has had no swallowing difficulties.  PHYSICAL EXAMINATION:  This is a well-developed, well-nourished white female in no obvious distress.  Vital signs:  Temperature of 97.9, pulse 90, respiratory rate 16, blood pressure 126/62, weight is 157.  Head and Neck:  Normocephalic, atraumatic skull.  There are no ocular or oral lesions.  There are no palpable cervical or supraclavicular lymph nodes. Lungs:  Clear bilaterally.  Cardiac:  Regular rate and rhythm with a normal S1, S2.  There are no murmurs, rubs or bruits.  Abdomen:  Soft with good bowel sounds.  There is no palpable abdominal mass.  There is no fluid wave.  There is no palpable hepatosplenomegaly.  Back:  No tenderness over the spine, ribs, or hips.  There may be some slight tenderness in the lumbosacral spine.  Extremities:  Show some trace edema in her lower legs.  She has good range motion of her joints.  She has 5/5 strength bilaterally.  Neurological:  No focal neurological deficits.  LABORATORY STUDIES:  White cell count 8.6, hemoglobin  12.6, hematocrit 37.6, platelet count 561.  Sodium 138, potassium 3.7, BUN 19, creatinine 0.9.  Calcium 8.6 with an albumin of 3.1.  IMPRESSION:  [name] is a very charming 57 year old white female with 2 separate cancers now.  Again, the most serious one, from my point of view, is the lung cancer.  As such, we will continue to focus on this with respect to her quality of life and prognosis.  She is due for another set of scans for restaging in February.  We will get these set up in early February.  She will get her Zometa today.  Again, the breast cancer should not be much of an issue clinically.  We will put her on Aromasin.  I do not see that the Aromasin will cause  any problems or interact with what else she is taking.  We will get [name] back in in another month.  I probably would not repeat an ultrasound of the breast probably until April, so we can see what kind of response we have gotten with the Aromasin.    ______________________________ Josph Macho, M.D. PRE/MEDQ  D:  01/24/2012  T:  01/25/2012  Job:  [id number]

## 2012-02-11 ENCOUNTER — Other Ambulatory Visit: Payer: Self-pay | Admitting: Hematology & Oncology

## 2012-02-19 ENCOUNTER — Ambulatory Visit (HOSPITAL_COMMUNITY)
Admission: RE | Admit: 2012-02-19 | Discharge: 2012-02-19 | Disposition: A | Discharge status: Home / Self Care | Payer: BC Managed Care – PPO | Source: Ambulatory Visit | Attending: Hematology & Oncology | Admitting: Hematology & Oncology

## 2012-02-19 DIAGNOSIS — C7952 Secondary malignant neoplasm of bone marrow: Secondary | ICD-10-CM | POA: Insufficient documentation

## 2012-02-19 DIAGNOSIS — Z923 Personal history of irradiation: Secondary | ICD-10-CM | POA: Insufficient documentation

## 2012-02-19 DIAGNOSIS — I517 Cardiomegaly: Secondary | ICD-10-CM | POA: Insufficient documentation

## 2012-02-19 DIAGNOSIS — C7951 Secondary malignant neoplasm of bone: Secondary | ICD-10-CM | POA: Insufficient documentation

## 2012-02-19 DIAGNOSIS — C7800 Secondary malignant neoplasm of unspecified lung: Secondary | ICD-10-CM | POA: Insufficient documentation

## 2012-02-19 DIAGNOSIS — M8448XA Pathological fracture, other site, initial encounter for fracture: Secondary | ICD-10-CM | POA: Insufficient documentation

## 2012-02-19 DIAGNOSIS — C3490 Malignant neoplasm of unspecified part of unspecified bronchus or lung: Secondary | ICD-10-CM

## 2012-02-19 MED ORDER — IOHEXOL 300 MG/ML  SOLN
100.0000 mL | Freq: Once | INTRAMUSCULAR | Status: AC | PRN
  Administered 2012-02-19: 100 mL via INTRAVENOUS

## 2012-02-21 ENCOUNTER — Ambulatory Visit (HOSPITAL_BASED_OUTPATIENT_CLINIC_OR_DEPARTMENT_OTHER): Payer: BC Managed Care – PPO

## 2012-02-21 ENCOUNTER — Ambulatory Visit (HOSPITAL_BASED_OUTPATIENT_CLINIC_OR_DEPARTMENT_OTHER): Payer: BC Managed Care – PPO | Admitting: Hematology & Oncology

## 2012-02-21 ENCOUNTER — Other Ambulatory Visit: Payer: BC Managed Care – PPO | Admitting: Lab

## 2012-02-21 VITALS — BP 137/76 | HR 118 | Temp 98.0°F | Resp 16 | Ht 61.0 in | Wt 157.0 lb

## 2012-02-21 DIAGNOSIS — C3490 Malignant neoplasm of unspecified part of unspecified bronchus or lung: Secondary | ICD-10-CM

## 2012-02-21 DIAGNOSIS — C50419 Malignant neoplasm of upper-outer quadrant of unspecified female breast: Secondary | ICD-10-CM

## 2012-02-21 DIAGNOSIS — C7931 Secondary malignant neoplasm of brain: Secondary | ICD-10-CM

## 2012-02-21 DIAGNOSIS — C7952 Secondary malignant neoplasm of bone marrow: Secondary | ICD-10-CM

## 2012-02-21 DIAGNOSIS — C3430 Malignant neoplasm of lower lobe, unspecified bronchus or lung: Secondary | ICD-10-CM

## 2012-02-21 DIAGNOSIS — C7951 Secondary malignant neoplasm of bone: Secondary | ICD-10-CM

## 2012-02-21 LAB — COMPREHENSIVE METABOLIC PANEL
ALT: 17 U/L (ref 0–35)
AST: 23 U/L (ref 0–37)
Albumin: 3.9 g/dL (ref 3.5–5.2)
Calcium: 9.7 mg/dL (ref 8.4–10.5)
Chloride: 98 mEq/L (ref 96–112)
Potassium: 3.9 mEq/L (ref 3.5–5.3)

## 2012-02-21 MED ORDER — ZOLEDRONIC ACID 4 MG/100ML IV SOLN
4.0000 mg | Freq: Once | INTRAVENOUS | Status: AC
  Administered 2012-02-21: 4 mg via INTRAVENOUS
  Filled 2012-02-21: qty 100

## 2012-02-21 MED ORDER — SODIUM CHLORIDE 0.9 % IV SOLN
Freq: Once | INTRAVENOUS | Status: AC
  Administered 2012-02-21: 13:00:00 via INTRAVENOUS

## 2012-02-21 MED ORDER — AFATINIB DIMALEATE 40 MG PO TABS: 40.0000 mg | ORAL_TABLET | Freq: Every day | ORAL | Status: DC

## 2012-02-21 NOTE — Patient Instructions (Addendum)
Zoledronic Acid injection (Hypercalcemia, Oncology) What is this medicine? ZOLEDRONIC ACID (ZOE le dron ik AS id) lowers the amount of calcium loss from bone. It is used to treat too much calcium in your blood from cancer. It is also used to prevent complications of cancer that has spread to the bone. This medicine may be used for other purposes; ask your health care provider or pharmacist if you have questions. What should I tell my health care provider before I take this medicine? They need to know if you have any of these conditions: -aspirin-sensitive asthma -dental disease -kidney disease -an unusual or allergic reaction to zoledronic acid, other medicines, foods, dyes, or preservatives -pregnant or trying to get pregnant -breast-feeding How should I use this medicine? This medicine is for infusion into a vein. It is given by a health care professional in a hospital or clinic setting. Talk to your pediatrician regarding the use of this medicine in children. Special care may be needed. Overdosage: If you think you have taken too much of this medicine contact a poison control center or emergency room at once. NOTE: This medicine is only for you. Do not share this medicine with others. What if I miss a dose? It is important not to miss your dose. Call your doctor or health care professional if you are unable to keep an appointment. What may interact with this medicine? -certain antibiotics given by injection -NSAIDs, medicines for pain and inflammation, like ibuprofen or naproxen -some diuretics like bumetanide, furosemide -teriparatide -thalidomide This list may not describe all possible interactions. Give your health care provider a list of all the medicines, herbs, non-prescription drugs, or dietary supplements you use. Also tell them if you smoke, drink alcohol, or use illegal drugs. Some items may interact with your medicine. What should I watch for while using this medicine? Visit  your doctor or health care professional for regular checkups. It may be some time before you see the benefit from this medicine. Do not stop taking your medicine unless your doctor tells you to. Your doctor may order blood tests or other tests to see how you are doing. Women should inform their doctor if they wish to become pregnant or think they might be pregnant. There is a potential for serious side effects to an unborn child. Talk to your health care professional or pharmacist for more information. You should make sure that you get enough calcium and vitamin D while you are taking this medicine. Discuss the foods you eat and the vitamins you take with your health care professional. Some people who take this medicine have severe bone, joint, and/or muscle pain. This medicine may also increase your risk for a broken thigh bone. Tell your doctor right away if you have pain in your upper leg or groin. Tell your doctor if you have any pain that does not go away or that gets worse. What side effects may I notice from receiving this medicine? Side effects that you should report to your doctor or health care professional as soon as possible: -allergic reactions like skin rash, itching or hives, swelling of the face, lips, or tongue -anxiety, confusion, or depression -breathing problems -changes in vision -feeling faint or lightheaded, falls -jaw burning, cramping, pain -muscle cramps, stiffness, or weakness -trouble passing urine or change in the amount of urine Side effects that usually do not require medical attention (report to your doctor or health care professional if they continue or are bothersome): -bone, joint, or muscle pain -  fever -hair loss -irritation at site where injected -loss of appetite -nausea, vomiting -stomach upset -tired This list may not describe all possible side effects. Call your doctor for medical advice about side effects. You may report side effects to FDA at  1-800-FDA-1088. Where should I keep my medicine? This drug is given in a hospital or clinic and will not be stored at home. NOTE: This sheet is a summary. It may not cover all possible information. If you have questions about this medicine, talk to your doctor, pharmacist, or health care provider.  2012, [employer]. (07/01/2010 9:06:58 AM) 

## 2012-02-21 NOTE — Progress Notes (Signed)
This office note has been dictated.

## 2012-02-22 NOTE — Progress Notes (Signed)
CC:   [name], M.D.  DIAGNOSES: 1. Progressive adenocarcinoma of the lung-EGFR positive. 2. Invasive lobular carcinoma of the right breast.  CURRENT THERAPY: 1. Patient to start afatinib 40 mg p.o. daily. 2. Aromasin 25 mg p.o. daily. 3. Zometa 4 mg IV every 4 weeks.  INTERIM HISTORY:  [name] comes in for her followup.  She feels well.  Her back is not hurting as much.  She is working.  Her appetite has been quite good.  There has been no headache.  She has really tolerated the Tarceva well.  She has had no problems with the Aromasin.  We did do her followup scans.  Unfortunately, she is now progressing. She has progression with her lung primary and lung metastases.  She has increase in the left lung mass, now measuring 4.7 x 3.2 cm.  There appears to be some new nodules.  There is some minimal mediastinal adenopathy.  There is no disease below the diaphragm.  She does have some stable sclerotic metastases.  We are going to have to change her therapy.  I still feel that we can get away with another TKI.  As such, we will try the new agent, afatinib.  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  PHYSICAL EXAMINATION:  General:  This is a well-developed, well- nourished white female in no obvious distress.  Vital signs: Temperature of 98, pulse 118, respiratory rate 16, blood pressure 137/76.  Weight is 157.  Head and neck:  Normocephalic, atraumatic skull.  There are no ocular or oral lesions.  There are no palpable cervical or supraclavicular lymph nodes.  Lungs:  Clear bilaterally. Cardiac:  Regular rate and rhythm with a normal S1 and S2.  There are no murmurs, rubs, or bruits.  Abdomen:  Soft with good bowel sounds.  There is no palpable abdominal mass.  There is no palpable hepatosplenomegaly. Back:  No tenderness over the spine, ribs, or hips.  Extremities:  No clubbing, cyanosis, or edema.  Neurological:  No  focal neurological deficits.  LABORATORY STUDIES:  White cell count is 8.6, hemoglobin 12.6, hematocrit 37.6, platelet count 561.  BUN is 19, creatinine 0.9. Calcium 8.6 with an albumin of 2.1.  IMPRESSION:  [name] is a very nice 57 year old white female with metastatic adenocarcinoma of the lung.  Again, she does have the EGFR mutation.  For now, we will see about changing her protocol.  We will get her on afatinib.  I talked to her at length.  I told that if the afatinib does not work, then I would consider her for a clinical trial.  The only problem that we are going to have is that she has the concurrent breast cancer and I think that would disqualify her from almost any trial that is out there.  At some point, we will need to evaluate what is going on with the breast.  I probably would get a mammogram on her sometime in late March.  This would give [place] an idea as to how effective the Aromasin is.  One would think that Aromasin would be helpful as her tumor is ER positive.  I just feel bad for [name].  She is trying her best.  She has really done well.  She had a very nice response to Tarceva initially.  We will go ahead and get her on the afatinib.  The dose will be 40 mg a day.  We will plan  to get back in 3 weeks' time.  Again, I will probably set her up with a mammogram sometime in March.  I probably would not do another CT scan on her until April so we can see how the afatinib is working.    ______________________________ Josph Macho, M.D. PRE/MEDQ  D:  02/21/2012  T:  02/22/2012  Job:  [id number]

## 2012-03-14 ENCOUNTER — Ambulatory Visit: Payer: BC Managed Care – PPO

## 2012-03-14 ENCOUNTER — Ambulatory Visit (HOSPITAL_BASED_OUTPATIENT_CLINIC_OR_DEPARTMENT_OTHER): Payer: BC Managed Care – PPO | Admitting: Hematology & Oncology

## 2012-03-14 ENCOUNTER — Ambulatory Visit (HOSPITAL_BASED_OUTPATIENT_CLINIC_OR_DEPARTMENT_OTHER): Payer: BC Managed Care – PPO

## 2012-03-14 ENCOUNTER — Other Ambulatory Visit (HOSPITAL_BASED_OUTPATIENT_CLINIC_OR_DEPARTMENT_OTHER): Payer: BC Managed Care – PPO | Admitting: Lab

## 2012-03-14 VITALS — BP 127/87 | HR 109 | Temp 97.9°F | Resp 16 | Ht 61.0 in | Wt 164.0 lb

## 2012-03-14 DIAGNOSIS — C3490 Malignant neoplasm of unspecified part of unspecified bronchus or lung: Secondary | ICD-10-CM

## 2012-03-14 DIAGNOSIS — C7952 Secondary malignant neoplasm of bone marrow: Secondary | ICD-10-CM

## 2012-03-14 DIAGNOSIS — C3430 Malignant neoplasm of lower lobe, unspecified bronchus or lung: Secondary | ICD-10-CM

## 2012-03-14 DIAGNOSIS — M549 Dorsalgia, unspecified: Secondary | ICD-10-CM

## 2012-03-14 DIAGNOSIS — C7931 Secondary malignant neoplasm of brain: Secondary | ICD-10-CM

## 2012-03-14 DIAGNOSIS — C7951 Secondary malignant neoplasm of bone: Secondary | ICD-10-CM

## 2012-03-14 DIAGNOSIS — C50419 Malignant neoplasm of upper-outer quadrant of unspecified female breast: Secondary | ICD-10-CM

## 2012-03-14 LAB — CBC WITH DIFFERENTIAL (CANCER CENTER ONLY)
BASO%: 0.6 % (ref 0.0–2.0)
EOS%: 5.8 % (ref 0.0–7.0)
LYMPH#: 0.7 10*3/uL — ABNORMAL LOW (ref 0.9–3.3)
MCHC: 32 g/dL (ref 32.0–36.0)
NEUT#: 3.8 10*3/uL (ref 1.5–6.5)
NEUT%: 72.5 % (ref 39.6–80.0)
RDW: 14.4 % (ref 11.1–15.7)

## 2012-03-14 LAB — CMP (CANCER CENTER ONLY)
ALT(SGPT): 24 U/L (ref 10–47)
AST: 26 U/L (ref 11–38)
Calcium: 8.9 mg/dL (ref 8.0–10.3)
Chloride: 102 mEq/L (ref 98–108)
Creat: 0.7 mg/dl (ref 0.6–1.2)
Potassium: 4.7 mEq/L (ref 3.3–4.7)

## 2012-03-14 MED ORDER — ZOLEDRONIC ACID 4 MG/100ML IV SOLN
4.0000 mg | Freq: Once | INTRAVENOUS | Status: AC
  Administered 2012-03-14: 4 mg via INTRAVENOUS
  Filled 2012-03-14: qty 100

## 2012-03-14 MED ORDER — SODIUM CHLORIDE 0.9 % IV SOLN
Freq: Once | INTRAVENOUS | Status: AC
  Administered 2012-03-14: 15:00:00 via INTRAVENOUS

## 2012-03-14 NOTE — Patient Instructions (Signed)
Zoledronic Acid injection (Hypercalcemia, Oncology) What is this medicine? ZOLEDRONIC ACID (ZOE le dron ik AS id) lowers the amount of calcium loss from bone. It is used to treat too much calcium in your blood from cancer. It is also used to prevent complications of cancer that has spread to the bone. This medicine may be used for other purposes; ask your health care provider or pharmacist if you have questions. What should I tell my health care provider before I take this medicine? They need to know if you have any of these conditions: -aspirin-sensitive asthma -dental disease -kidney disease -an unusual or allergic reaction to zoledronic acid, other medicines, foods, dyes, or preservatives -pregnant or trying to get pregnant -breast-feeding How should I use this medicine? This medicine is for infusion into a vein. It is given by a health care professional in a hospital or clinic setting. Talk to your pediatrician regarding the use of this medicine in children. Special care may be needed. Overdosage: If you think you have taken too much of this medicine contact a poison control center or emergency room at once. NOTE: This medicine is only for you. Do not share this medicine with others. What if I miss a dose? It is important not to miss your dose. Call your doctor or health care professional if you are unable to keep an appointment. What may interact with this medicine? -certain antibiotics given by injection -NSAIDs, medicines for pain and inflammation, like ibuprofen or naproxen -some diuretics like bumetanide, furosemide -teriparatide -thalidomide This list may not describe all possible interactions. Give your health care provider a list of all the medicines, herbs, non-prescription drugs, or dietary supplements you use. Also tell them if you smoke, drink alcohol, or use illegal drugs. Some items may interact with your medicine. What should I watch for while using this medicine? Visit  your doctor or health care professional for regular checkups. It may be some time before you see the benefit from this medicine. Do not stop taking your medicine unless your doctor tells you to. Your doctor may order blood tests or other tests to see how you are doing. Women should inform their doctor if they wish to become pregnant or think they might be pregnant. There is a potential for serious side effects to an unborn child. Talk to your health care professional or pharmacist for more information. You should make sure that you get enough calcium and vitamin D while you are taking this medicine. Discuss the foods you eat and the vitamins you take with your health care professional. Some people who take this medicine have severe bone, joint, and/or muscle pain. This medicine may also increase your risk for a broken thigh bone. Tell your doctor right away if you have pain in your upper leg or groin. Tell your doctor if you have any pain that does not go away or that gets worse. What side effects may I notice from receiving this medicine? Side effects that you should report to your doctor or health care professional as soon as possible: -allergic reactions like skin rash, itching or hives, swelling of the face, lips, or tongue -anxiety, confusion, or depression -breathing problems -changes in vision -feeling faint or lightheaded, falls -jaw burning, cramping, pain -muscle cramps, stiffness, or weakness -trouble passing urine or change in the amount of urine Side effects that usually do not require medical attention (report to your doctor or health care professional if they continue or are bothersome): -bone, joint, or muscle pain -  fever -hair loss -irritation at site where injected -loss of appetite -nausea, vomiting -stomach upset -tired This list may not describe all possible side effects. Call your doctor for medical advice about side effects. You may report side effects to FDA at  1-800-FDA-1088. Where should I keep my medicine? This drug is given in a hospital or clinic and will not be stored at home. NOTE: This sheet is a summary. It may not cover all possible information. If you have questions about this medicine, talk to your doctor, pharmacist, or health care provider.  2013, [employer]. (07/01/2010 9:06:58 AM)  

## 2012-03-14 NOTE — Progress Notes (Signed)
This office note has been dictated.

## 2012-03-15 NOTE — Progress Notes (Signed)
CC:   [name], M.D.  DIAGNOSES: 1. Metastatic adenocarcinoma of the lung, epidermal growth factor     receptor positive. 2. Invasive lobular carcinoma of the right breast.  CURRENT THERAPY: 1. Afatinib 40 mg p.o. daily. 2. Aromasin 25 mg p.o. daily. 3. Zometa 4 mg IV q.4 weeks.  INTERIM HISTORY:  Ms Apps comes in for her followup.  She is tolerating the afatinib quite well.  She has had no nausea or vomiting with this.  She has had some skin issues but nothing that she cannot handle.  She has had no diarrhea.  She is a little constipated.  Her back is not hurting her as bad.  She has had kyphoplasty to the back because of compression fractures from her metastatic lung cancer.  She had radiation therapy.  She has had no problems with cough.  There is no headache.  She has had radiation therapy to the brain.  She underwent stereotactic radiosurgery.  There is no leg swelling.  Her joints are not bothering her.  PHYSICAL EXAMINATION:  General:  This is a well-developed, well- nourished white female in no obvious distress.  Vital signs: Temperature of 97.9, pulse 109, respiratory rate 16, blood pressure 127/87.  Weight is 164.  Head and neck:  Normocephalic, atraumatic skull.  There are no ocular or oral lesions.  There are no palpable cervical or supraclavicular lymph nodes.  Lungs:  Clear to percussion and auscultation bilaterally.  Cardiac:  Regular rate and rhythm with a normal S1 and S2.  There are no murmurs, rubs or bruits.  Abdomen:  Soft with good bowel sounds.  There is no palpable abdominal mass.  There is no palpable hepatosplenomegaly.  Back:  Shows no tenderness over the spine, ribs, or hips.  Extremities:  Show no clubbing, cyanosis or edema.  Neurological:  Shows no focal neurological deficits.  Skin does show some slightly dry skin.  She has some small areas of maculopapular- type rash.  LABORATORY STUDIES:  White cell count is 5.2, hemoglobin  11.4, hematocrit 35.6, platelet count 467.  Sodium 145, potassium 4.7, BUN 10, creatinine 0.7.  Calcium is 8.9.  IMPRESSION:  Ms Klatt is a very nice 57 year old white female with metastatic adenocarcinoma of the lung.  She never smoked.  She is epidermal growth factor receptor positive.  We will go ahead and continue on the afatinib.  We will plan for followup CT scan in about 1 month or so.  I also want to a mammogram to see how she has responded to the Aromasin.  I believe that Zometa is helping her.  Her quality of life is doing quite better right now.    ______________________________ Josph Macho, M.D. PRE/MEDQ  D:  03/14/2012  T:  03/15/2012  Job:  [id number]

## 2012-04-01 ENCOUNTER — Encounter: Payer: Self-pay | Admitting: Hematology & Oncology

## 2012-04-04 ENCOUNTER — Other Ambulatory Visit: Payer: Self-pay | Admitting: Hematology & Oncology

## 2012-04-04 ENCOUNTER — Ambulatory Visit
Admission: RE | Admit: 2012-04-04 | Discharge: 2012-04-04 | Disposition: A | Discharge status: Home / Self Care | Payer: BC Managed Care – PPO | Source: Ambulatory Visit | Attending: Radiation Oncology | Admitting: Radiation Oncology

## 2012-04-04 DIAGNOSIS — C7931 Secondary malignant neoplasm of brain: Secondary | ICD-10-CM

## 2012-04-04 MED ORDER — GADOBENATE DIMEGLUMINE 529 MG/ML IV SOLN
15.0000 mL | Freq: Once | INTRAVENOUS | Status: AC | PRN
  Administered 2012-04-04: 15 mL via INTRAVENOUS

## 2012-04-05 ENCOUNTER — Other Ambulatory Visit: Payer: BC Managed Care – PPO

## 2012-04-07 ENCOUNTER — Encounter: Payer: Self-pay | Admitting: Radiation Oncology

## 2012-04-08 ENCOUNTER — Encounter: Payer: Self-pay | Admitting: Radiation Oncology

## 2012-04-08 ENCOUNTER — Ambulatory Visit
Admission: RE | Admit: 2012-04-08 | Discharge: 2012-04-08 | Disposition: A | Discharge status: Home / Self Care | Payer: BC Managed Care – PPO | Source: Ambulatory Visit | Attending: Radiation Oncology | Admitting: Radiation Oncology

## 2012-04-08 VITALS — BP 122/85 | HR 80 | Temp 97.7°F | Resp 20 | Wt 168.8 lb

## 2012-04-08 DIAGNOSIS — C7931 Secondary malignant neoplasm of brain: Secondary | ICD-10-CM

## 2012-04-08 HISTORY — DX: Reserved for inherently not codable concepts without codable children: IMO0001

## 2012-04-08 HISTORY — DX: Reserved for concepts with insufficient information to code with codable children: IMO0002

## 2012-04-08 NOTE — Progress Notes (Signed)
Radiation Oncology         [phone] ________________________________  Name: [name] MRN: [id number]  Date: 04/08/2012  DOB: 1955/11/23  Follow-Up Visit Note  Outpatient  CC: [name], MD  [name], MD  Diagnosis:   1. 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  2. Invasive lobular carcinoma of the right breast.   Interval Since Last Radiation:  Interval Since Last Radiation: She completed stereotactic radiosurgery to the right frontal, right thalamic, left frontal and left occipital target, 20 Gray in 1 fraction to each target, on 07/19/11.  Narrative:  The patient returns today for routine follow-up.   Feels relatively good. She is taking Trental once a day and vitamin E twice a day. She is tolerating this regimen. She denies any headaches blurred vision dizziness or unsteadiness. She does have occasional nausea. She also has pain in her lower and occasionally her mid back which is chronic.  CURRENT THERAPY:  1. Afatinib 40 mg p.o. daily.  2. Aromasin 25 mg p.o. Daily  - conservative management of breast cancer 3. Zometa 4 mg IV q.4 weeks  CT in early Feb of Chest Abd and Pelvis showed pulmonary metastases progression. Continued osseous metastases.    Most recent MRI of the brain on 04/04/12 demonstrates increased size of the right frontal lesion, more consistent with radionecrosis than progression. Right thalamic lesion is smaller, left occipital lesion is gone, and left frontal lesion is slightly larger but this is a subtle change and brain tumor conference consensus is to watch this closely with f/u MRI in 3 months.        ALLERGIES:  has No Known Allergies.  Meds: Current Outpatient Prescriptions  Medication Sig Dispense Refill  . afatinib dimaleate (GILOTRIF) 40 MG tablet Take 1 tablet (40 mg total) by mouth daily. Take on an empty stomach 1hr before or 2 hrs after meals.  30 tablet  4  .  ALPRAZolam (XANAX) 0.25 MG tablet Take 0.25-0.5 mg by mouth every 8 (eight) hours as needed. For anxiety.      . diphenhydrAMINE (BENADRYL) 25 MG tablet Take 25 mg by mouth at bedtime as needed.       . doxycycline (VIBRA-TABS) 100 MG tablet Take 100 mg by mouth Twice daily.      Marland Kitchen doxycycline (VIBRA-TABS) 100 MG tablet TAKE 1 TABLET (100 MG TOTAL) BY MOUTH 2 (TWO) TIMES DAILY.  60 tablet  6  . exemestane (AROMASIN) 25 MG tablet Take 1 tablet (25 mg total) by mouth daily after breakfast.  30 tablet  6  . gabapentin (NEURONTIN) 100 MG capsule 100 mg 3 (three) times daily.       Marland Kitchen HYDROcodone-acetaminophen (NORCO) 10-325 MG per tablet Take 1 tablet by mouth every 6 (six) hours as needed for pain.  120 tablet  0  . hydrocortisone cream 0.5 % Like to skin at bedtime every night starting the day before Tarceva.  60 g  6  . ibuprofen (ADVIL,MOTRIN) 200 MG tablet Take 200 mg by mouth every 6 (six) hours as needed.      . loperamide (IMODIUM) 2 MG capsule Take 2 mg by mouth 4 (four) times daily as needed. For diarhea      . pentoxifylline (TRENTAL) 400 MG CR tablet Take 400 mg by mouth daily.      . promethazine (PHENERGAN) 12.5 MG tablet Take 12.5 mg by mouth daily as needed. for nausea      .  pseudoephedrine (SUDAFED) 30 MG tablet Take 30 mg by mouth every 4 (four) hours as needed. For allergies      . Skin Protectants, Misc. (EUCERIN) cream Apply 1 application topically every morning.      . triamterene-hydrochlorothiazide (MAXZIDE-25) 37.5-25 MG per tablet TAKE 1 TABLET BY MOUTH EVERY DAY  30 tablet  3  . vitamin E (VITAMIN E) 400 UNIT capsule Take 1 capsule (400 Units total) by mouth daily. After 1 week, take 1 capsule twice daily.  60 capsule  5   No current facility-administered medications for this encounter.    Physical Findings: Vitals with Age-Percentiles 04/08/2012  Length   Systolic 122  Diastolic 85  Pulse 80  Respiration 20  Weight 76.567 kg  VISIT REPORT    The patient is in no  acute distress. Patient is alert and oriented. Extraocular movements are intact. Pupils equally round and reactive to light. Oropharynx is clear with no thrush. Neck is supple without palpable adenopathy. Finger to nose testing and rapidly alternating movements are intact. Strength is symmetric throughout. She has symmetric normal reflexes in the upper and lower extremities. Speech is fluent. 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  Lung cancer.  Breast cancer.  MRI HEAD WITHOUT AND WITH CONTRAST  Technique:  Multiplanar, multiecho pulse sequences of the brain and surrounding structures were obtained according to standard protocol without and with intravenous contrast  Contrast: 15mL MULTIHANCE GADOBENATE DIMEGLUMINE 529 MG/ML IV SOLN  Comparison: MRI of the brain 12/29/2011.  Findings: The inferior right frontal lobe lesion continues to increase in size.  It now measures 10.8 x 9.0 6.9 mm.  The previous maximal measurement was 8.1 mm.  The right thalamic image on image 82 of series 10 there is slightly less conspicuous than on the previous study.   A lesion along the cortex of the posterior left frontal lobe now measures 2.3 mm compared with 1.7 mm previously.  This is best visualized on image 131 of series 10.  No new foci of enhancement are evident.  No acute infarct or hemorrhages present.  T2 hyperintensity associated with inferior right frontal lobe lesion has significantly increased as well.  Flow is present in the major intracranial arteries.  The globes and orbits are intact.  Small polyps or mucous retention cysts are again noted along the floor of the left maxillary sinus.  The paranasal sinuses  are otherwise clear.  IMPRESSION:  1.  Increasing size of the inferior right frontal lobe lesion compatible with progression of disease.  If this area has been treated, radiation necrosis could also be considered. 2.  Slight progression of tiny posterior left frontal lobe cortical lesion. 3.  Decreased conspicuity of enhancement in the right thalamus.   Original Report Authenticated By: Marin Roberts, M.D.     Impression/Plan:   As discussed at our Brain tumor Conference, we will obtain a followup MRI of her Brain in 3 mo. No indications for further treatment to her brain at this time. 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 If she still has trouble tolerating BID Trental,  she can go back to once a day. 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 She and her husband were encouraged to call if she has any issues in the interim.  I spent 10 minutes minutes face to face with the patient and more than 50% of that time was spent in counseling and/or coordination of care. _____________________________________   Lonie Peak, MD

## 2012-04-08 NOTE — Progress Notes (Signed)
Pt denies HA, nausea, blurred vision, dizziness, unsteadiness. She does have occasional nausea, pain in her lower and occasionally mid back which is not new. She takes Ibuprofen or Hydrocodone prn w/good relief. She does have to take one of these meds daily. She states she "always has ache in her back".  Pt also c/o "muscle aches in upper arms/legs x 2 weeks". Prn meds relieve that pain. Pt not on any steroid at this time.

## 2012-04-17 ENCOUNTER — Ambulatory Visit (HOSPITAL_COMMUNITY)
Admission: RE | Admit: 2012-04-17 | Discharge: 2012-04-17 | Disposition: A | Discharge status: Home / Self Care | Payer: BC Managed Care – PPO | Source: Ambulatory Visit | Attending: Hematology & Oncology | Admitting: Hematology & Oncology

## 2012-04-17 ENCOUNTER — Ambulatory Visit (HOSPITAL_COMMUNITY): Payer: BC Managed Care – PPO

## 2012-04-17 DIAGNOSIS — Z79899 Other long term (current) drug therapy: Secondary | ICD-10-CM | POA: Insufficient documentation

## 2012-04-17 DIAGNOSIS — R222 Localized swelling, mass and lump, trunk: Secondary | ICD-10-CM | POA: Insufficient documentation

## 2012-04-17 DIAGNOSIS — J398 Other specified diseases of upper respiratory tract: Secondary | ICD-10-CM | POA: Insufficient documentation

## 2012-04-17 DIAGNOSIS — R7989 Other specified abnormal findings of blood chemistry: Secondary | ICD-10-CM | POA: Insufficient documentation

## 2012-04-17 DIAGNOSIS — R918 Other nonspecific abnormal finding of lung field: Secondary | ICD-10-CM | POA: Insufficient documentation

## 2012-04-17 DIAGNOSIS — C7951 Secondary malignant neoplasm of bone: Secondary | ICD-10-CM | POA: Insufficient documentation

## 2012-04-17 DIAGNOSIS — C3490 Malignant neoplasm of unspecified part of unspecified bronchus or lung: Secondary | ICD-10-CM | POA: Insufficient documentation

## 2012-04-17 DIAGNOSIS — C7931 Secondary malignant neoplasm of brain: Secondary | ICD-10-CM

## 2012-04-17 MED ORDER — IOHEXOL 300 MG/ML  SOLN
100.0000 mL | Freq: Once | INTRAMUSCULAR | Status: AC | PRN
  Administered 2012-04-17: 100 mL via INTRAVENOUS

## 2012-04-18 ENCOUNTER — Other Ambulatory Visit: Payer: Self-pay | Admitting: Hematology & Oncology

## 2012-04-18 ENCOUNTER — Ambulatory Visit (HOSPITAL_COMMUNITY): Payer: BC Managed Care – PPO

## 2012-04-18 ENCOUNTER — Ambulatory Visit
Admission: RE | Admit: 2012-04-18 | Discharge: 2012-04-18 | Disposition: A | Discharge status: Home / Self Care | Payer: BC Managed Care – PPO | Source: Ambulatory Visit | Attending: Hematology & Oncology | Admitting: Hematology & Oncology

## 2012-04-18 ENCOUNTER — Other Ambulatory Visit (HOSPITAL_COMMUNITY): Payer: BC Managed Care – PPO

## 2012-04-18 DIAGNOSIS — C7951 Secondary malignant neoplasm of bone: Secondary | ICD-10-CM

## 2012-04-18 DIAGNOSIS — C7931 Secondary malignant neoplasm of brain: Secondary | ICD-10-CM

## 2012-04-23 ENCOUNTER — Ambulatory Visit (HOSPITAL_BASED_OUTPATIENT_CLINIC_OR_DEPARTMENT_OTHER): Payer: BC Managed Care – PPO | Admitting: Hematology & Oncology

## 2012-04-23 ENCOUNTER — Ambulatory Visit (HOSPITAL_BASED_OUTPATIENT_CLINIC_OR_DEPARTMENT_OTHER): Payer: BC Managed Care – PPO

## 2012-04-23 ENCOUNTER — Other Ambulatory Visit (HOSPITAL_BASED_OUTPATIENT_CLINIC_OR_DEPARTMENT_OTHER): Payer: BC Managed Care – PPO | Admitting: Lab

## 2012-04-23 DIAGNOSIS — C7951 Secondary malignant neoplasm of bone: Secondary | ICD-10-CM

## 2012-04-23 DIAGNOSIS — C50419 Malignant neoplasm of upper-outer quadrant of unspecified female breast: Secondary | ICD-10-CM

## 2012-04-23 DIAGNOSIS — C7931 Secondary malignant neoplasm of brain: Secondary | ICD-10-CM

## 2012-04-23 DIAGNOSIS — C7952 Secondary malignant neoplasm of bone marrow: Secondary | ICD-10-CM

## 2012-04-23 DIAGNOSIS — C3430 Malignant neoplasm of lower lobe, unspecified bronchus or lung: Secondary | ICD-10-CM

## 2012-04-23 LAB — CBC WITH DIFFERENTIAL (CANCER CENTER ONLY)
BASO%: 0.6 % (ref 0.0–2.0)
EOS%: 4.7 % (ref 0.0–7.0)
HCT: 37.1 % (ref 34.8–46.6)
LYMPH%: 10.2 % — ABNORMAL LOW (ref 14.0–48.0)
MCH: 29.1 pg (ref 26.0–34.0)
MCHC: 33.2 g/dL (ref 32.0–36.0)
MCV: 88 fL (ref 81–101)
MONO#: 0.6 10*3/uL (ref 0.1–0.9)
MONO%: 8.2 % (ref 0.0–13.0)
NEUT%: 76.3 % (ref 39.6–80.0)
Platelets: 425 10*3/uL — ABNORMAL HIGH (ref 145–400)
RDW: 13.8 % (ref 11.1–15.7)
WBC: 7.1 10*3/uL (ref 3.9–10.0)

## 2012-04-23 LAB — CMP (CANCER CENTER ONLY)
ALT(SGPT): 27 U/L (ref 10–47)
Alkaline Phosphatase: 174 U/L — ABNORMAL HIGH (ref 26–84)
CO2: 29 mEq/L (ref 18–33)
Creat: 0.5 mg/dl — ABNORMAL LOW (ref 0.6–1.2)
Sodium: 142 mEq/L (ref 128–145)
Total Bilirubin: 0.5 mg/dl (ref 0.20–1.60)
Total Protein: 7.6 g/dL (ref 6.4–8.1)

## 2012-04-23 MED ORDER — ZOLEDRONIC ACID 4 MG/100ML IV SOLN
4.0000 mg | Freq: Once | INTRAVENOUS | Status: AC
  Administered 2012-04-23: 4 mg via INTRAVENOUS
  Filled 2012-04-23: qty 100

## 2012-04-23 MED ORDER — SODIUM CHLORIDE 0.9 % IV SOLN
Freq: Once | INTRAVENOUS | Status: AC
  Administered 2012-04-23: 10:00:00 via INTRAVENOUS

## 2012-04-23 NOTE — Patient Instructions (Signed)
Zoledronic Acid injection (Hypercalcemia, Oncology) What is this medicine? ZOLEDRONIC ACID (ZOE le dron ik AS id) lowers the amount of calcium loss from bone. It is used to treat too much calcium in your blood from cancer. It is also used to prevent complications of cancer that has spread to the bone. This medicine may be used for other purposes; ask your health care provider or pharmacist if you have questions. What should I tell my health care provider before I take this medicine? They need to know if you have any of these conditions: -aspirin-sensitive asthma -dental disease -kidney disease -an unusual or allergic reaction to zoledronic acid, other medicines, foods, dyes, or preservatives -pregnant or trying to get pregnant -breast-feeding How should I use this medicine? This medicine is for infusion into a vein. It is given by a health care professional in a hospital or clinic setting. Talk to your pediatrician regarding the use of this medicine in children. Special care may be needed. Overdosage: If you think you have taken too much of this medicine contact a poison control center or emergency room at once. NOTE: This medicine is only for you. Do not share this medicine with others. What if I miss a dose? It is important not to miss your dose. Call your doctor or health care professional if you are unable to keep an appointment. What may interact with this medicine? -certain antibiotics given by injection -NSAIDs, medicines for pain and inflammation, like ibuprofen or naproxen -some diuretics like bumetanide, furosemide -teriparatide -thalidomide This list may not describe all possible interactions. Give your health care provider a list of all the medicines, herbs, non-prescription drugs, or dietary supplements you use. Also tell them if you smoke, drink alcohol, or use illegal drugs. Some items may interact with your medicine. What should I watch for while using this medicine? Visit  your doctor or health care professional for regular checkups. It may be some time before you see the benefit from this medicine. Do not stop taking your medicine unless your doctor tells you to. Your doctor may order blood tests or other tests to see how you are doing. Women should inform their doctor if they wish to become pregnant or think they might be pregnant. There is a potential for serious side effects to an unborn child. Talk to your health care professional or pharmacist for more information. You should make sure that you get enough calcium and vitamin D while you are taking this medicine. Discuss the foods you eat and the vitamins you take with your health care professional. Some people who take this medicine have severe bone, joint, and/or muscle pain. This medicine may also increase your risk for a broken thigh bone. Tell your doctor right away if you have pain in your upper leg or groin. Tell your doctor if you have any pain that does not go away or that gets worse. What side effects may I notice from receiving this medicine? Side effects that you should report to your doctor or health care professional as soon as possible: -allergic reactions like skin rash, itching or hives, swelling of the face, lips, or tongue -anxiety, confusion, or depression -breathing problems -changes in vision -feeling faint or lightheaded, falls -jaw burning, cramping, pain -muscle cramps, stiffness, or weakness -trouble passing urine or change in the amount of urine Side effects that usually do not require medical attention (report to your doctor or health care professional if they continue or are bothersome): -bone, joint, or muscle pain -  fever -hair loss -irritation at site where injected -loss of appetite -nausea, vomiting -stomach upset -tired This list may not describe all possible side effects. Call your doctor for medical advice about side effects. You may report side effects to FDA at  1-800-FDA-1088. Where should I keep my medicine? This drug is given in a hospital or clinic and will not be stored at home. NOTE: This sheet is a summary. It may not cover all possible information. If you have questions about this medicine, talk to your doctor, pharmacist, or health care provider.  2013, [employer]. (07/01/2010 9:06:58 AM)  

## 2012-04-23 NOTE — Progress Notes (Signed)
This office note has been dictated.

## 2012-04-24 NOTE — Progress Notes (Signed)
CC:   [name], M.D.  DIAGNOSES: 1. Metastatic adenocarcinoma of the lung-EGFR positive. 2. Invasive lobular carcinoma of the right breast.  CURRENT THERAPY: 1. Afatinib 40 mg p.o. daily. 2. Aromasin 25 mg p.o. daily. 3. Zometa 4 mg IV q.4 weeks.  INTERIM HISTORY:  Ms. Heiney comes in for her followup.  She really looks good.  She is tolerating the afatinib quite nicely.  She has had some skin issues.  There has been a little bit of pruritus. She does not have a lot of skin breakout.  She is still having some back discomfort.  She is tolerating this fairly well.  She just states she only takes her pain medicine maybe once a day.  She is still working and trying to work full-time.  She has had no cough.  She has had no nausea or vomiting.  She has had no fever, sweats, or chills.  There has been no headache.  We did go ahead and repeat her scans.  They were done on the 2nd of April.  The CT scan of the chest, abdomen, and pelvis basically showed stable disease.  The mass in the left lower lobe measures 3.2 x 4.8 cm. She has some scattered pulmonary nodules which really are stable.  She has no disease below the diaphragm.  Her osseous metastatic disease is stable.  She is being followed by Radiation Oncology for her brain metastases. She had an MRI back on the 20th of March.  The MRI showed increasing inferior right frontal lesion.  Again, Radiation Oncology is monitoring this and do not feel that this is something that needs to be treated.  PHYSICAL EXAMINATION:  General:  This is a well-developed, well- nourished white female in no obvious distress.  Vital signs: Temperature 98.3, pulse 96, respiratory rate 16, blood pressure 115/62. Weight is 163.  Head and neck:  Normocephalic, atraumatic skull.  There are no ocular or oral lesions.  There are no palpable cervical or supraclavicular lymph nodes.  Lungs:  Clear bilaterally.  Cardiac: Regular rate and rhythm with  a normal S1 and S2.  There are no murmurs, rubs, or bruits.  Abdomen:  Soft with good bowel sounds.  There is no palpable abdominal mass.  There is no palpable hepatosplenomegaly. Extremities:  No clubbing, cyanosis, or edema.  Neurological:  No focal neurological deficits.  Skin:  Faint macular type rash on her face and back.  LABORATORY STUDIES:  White cell count is 7.1, hemoglobin 12.3, hematocrit 37.1, platelet count 425.  IMPRESSION:  Ms. Roseman is a very nice 57 year old white female with metastatic adenocarcinoma of the lung.  Again, she is EGFR positive.  I forgot to mention that she had a mammogram done.  Her breast cancer is regressing.  We will keep her on the afatinib.  This is definitely being tolerated nicely.  We will go ahead and give her Zometa today.  Will plan to get her back in another month.  I probably will plan for another CT scan sometime in June.    ______________________________ Josph Macho, M.D. PRE/MEDQ  D:  04/23/2012  T:  04/24/2012  Job:  [id number]

## 2012-05-14 ENCOUNTER — Other Ambulatory Visit: Payer: Self-pay | Admitting: Radiation Therapy

## 2012-05-14 DIAGNOSIS — C7931 Secondary malignant neoplasm of brain: Secondary | ICD-10-CM

## 2012-05-22 ENCOUNTER — Ambulatory Visit (HOSPITAL_BASED_OUTPATIENT_CLINIC_OR_DEPARTMENT_OTHER): Payer: BC Managed Care – PPO | Admitting: Hematology & Oncology

## 2012-05-22 ENCOUNTER — Ambulatory Visit (HOSPITAL_BASED_OUTPATIENT_CLINIC_OR_DEPARTMENT_OTHER): Payer: BC Managed Care – PPO

## 2012-05-22 ENCOUNTER — Telehealth: Payer: Self-pay | Admitting: Hematology & Oncology

## 2012-05-22 ENCOUNTER — Other Ambulatory Visit (HOSPITAL_BASED_OUTPATIENT_CLINIC_OR_DEPARTMENT_OTHER): Payer: BC Managed Care – PPO | Admitting: Lab

## 2012-05-22 VITALS — BP 129/66 | HR 92 | Temp 98.4°F | Resp 16 | Ht 61.0 in | Wt 165.0 lb

## 2012-05-22 DIAGNOSIS — C50419 Malignant neoplasm of upper-outer quadrant of unspecified female breast: Secondary | ICD-10-CM

## 2012-05-22 DIAGNOSIS — C3430 Malignant neoplasm of lower lobe, unspecified bronchus or lung: Secondary | ICD-10-CM

## 2012-05-22 DIAGNOSIS — C7951 Secondary malignant neoplasm of bone: Secondary | ICD-10-CM

## 2012-05-22 DIAGNOSIS — C7952 Secondary malignant neoplasm of bone marrow: Secondary | ICD-10-CM

## 2012-05-22 DIAGNOSIS — M549 Dorsalgia, unspecified: Secondary | ICD-10-CM

## 2012-05-22 LAB — CBC WITH DIFFERENTIAL (CANCER CENTER ONLY)
BASO%: 0.5 % (ref 0.0–2.0)
Eosinophils Absolute: 0.4 10*3/uL (ref 0.0–0.5)
HCT: 37 % (ref 34.8–46.6)
LYMPH%: 14.4 % (ref 14.0–48.0)
MCH: 28.5 pg (ref 26.0–34.0)
MCV: 87 fL (ref 81–101)
MONO#: 0.6 10*3/uL (ref 0.1–0.9)
MONO%: 9.2 % (ref 0.0–13.0)
NEUT%: 70.4 % (ref 39.6–80.0)
Platelets: 481 10*3/uL — ABNORMAL HIGH (ref 145–400)
RDW: 14 % (ref 11.1–15.7)
WBC: 6.4 10*3/uL (ref 3.9–10.0)

## 2012-05-22 LAB — CMP (CANCER CENTER ONLY)
Alkaline Phosphatase: 169 U/L — ABNORMAL HIGH (ref 26–84)
CO2: 27 mEq/L (ref 18–33)
Creat: 0.7 mg/dl (ref 0.6–1.2)
Glucose, Bld: 98 mg/dL (ref 73–118)
Total Bilirubin: 0.4 mg/dl (ref 0.20–1.60)

## 2012-05-22 MED ORDER — SODIUM CHLORIDE 0.9 % IV SOLN
Freq: Once | INTRAVENOUS | Status: AC
  Administered 2012-05-22: 11:00:00 via INTRAVENOUS

## 2012-05-22 MED ORDER — ZOLEDRONIC ACID 4 MG/100ML IV SOLN
4.0000 mg | Freq: Once | INTRAVENOUS | Status: AC
  Administered 2012-05-22: 4 mg via INTRAVENOUS
  Filled 2012-05-22: qty 100

## 2012-05-22 MED ORDER — HYDROCODONE-ACETAMINOPHEN 10-325 MG PO TABS: 1.0000 | ORAL_TABLET | Freq: Four times a day (QID) | ORAL | Status: DC | PRN

## 2012-05-22 NOTE — Patient Instructions (Signed)
Zoledronic Acid injection (Hypercalcemia, Oncology) What is this medicine? ZOLEDRONIC ACID (ZOE le dron ik AS id) lowers the amount of calcium loss from bone. It is used to treat too much calcium in your blood from cancer. It is also used to prevent complications of cancer that has spread to the bone. This medicine may be used for other purposes; ask your health care provider or pharmacist if you have questions. What should I tell my health care provider before I take this medicine? They need to know if you have any of these conditions: -aspirin-sensitive asthma -dental disease -kidney disease -an unusual or allergic reaction to zoledronic acid, other medicines, foods, dyes, or preservatives -pregnant or trying to get pregnant -breast-feeding How should I use this medicine? This medicine is for infusion into a vein. It is given by a health care professional in a hospital or clinic setting. Talk to your pediatrician regarding the use of this medicine in children. Special care may be needed. Overdosage: If you think you have taken too much of this medicine contact a poison control center or emergency room at once. NOTE: This medicine is only for you. Do not share this medicine with others. What if I miss a dose? It is important not to miss your dose. Call your doctor or health care professional if you are unable to keep an appointment. What may interact with this medicine? -certain antibiotics given by injection -NSAIDs, medicines for pain and inflammation, like ibuprofen or naproxen -some diuretics like bumetanide, furosemide -teriparatide -thalidomide This list may not describe all possible interactions. Give your health care provider a list of all the medicines, herbs, non-prescription drugs, or dietary supplements you use. Also tell them if you smoke, drink alcohol, or use illegal drugs. Some items may interact with your medicine. What should I watch for while using this medicine? Visit  your doctor or health care professional for regular checkups. It may be some time before you see the benefit from this medicine. Do not stop taking your medicine unless your doctor tells you to. Your doctor may order blood tests or other tests to see how you are doing. Women should inform their doctor if they wish to become pregnant or think they might be pregnant. There is a potential for serious side effects to an unborn child. Talk to your health care professional or pharmacist for more information. You should make sure that you get enough calcium and vitamin D while you are taking this medicine. Discuss the foods you eat and the vitamins you take with your health care professional. Some people who take this medicine have severe bone, joint, and/or muscle pain. This medicine may also increase your risk for a broken thigh bone. Tell your doctor right away if you have pain in your upper leg or groin. Tell your doctor if you have any pain that does not go away or that gets worse. What side effects may I notice from receiving this medicine? Side effects that you should report to your doctor or health care professional as soon as possible: -allergic reactions like skin rash, itching or hives, swelling of the face, lips, or tongue -anxiety, confusion, or depression -breathing problems -changes in vision -feeling faint or lightheaded, falls -jaw burning, cramping, pain -muscle cramps, stiffness, or weakness -trouble passing urine or change in the amount of urine Side effects that usually do not require medical attention (report to your doctor or health care professional if they continue or are bothersome): -bone, joint, or muscle pain -  fever -hair loss -irritation at site where injected -loss of appetite -nausea, vomiting -stomach upset -tired This list may not describe all possible side effects. Call your doctor for medical advice about side effects. You may report side effects to FDA at  1-800-FDA-1088. Where should I keep my medicine? This drug is given in a hospital or clinic and will not be stored at home. NOTE: This sheet is a summary. It may not cover all possible information. If you have questions about this medicine, talk to your doctor, pharmacist, or health care provider.  2013, [employer]. (07/01/2010 9:06:58 AM)  

## 2012-05-22 NOTE — Telephone Encounter (Signed)
Per Dr. Myna Hidalgo ok for pt to keep 6-27 MRI appointment

## 2012-05-22 NOTE — Progress Notes (Signed)
This office note has been dictated.

## 2012-05-22 NOTE — Addendum Note (Signed)
Addended by: Arlan Organ R on: 05/22/2012 10:29 AM   Modules accepted: Orders

## 2012-05-23 NOTE — Progress Notes (Signed)
CC:   [name], M.D.  DIAGNOSES: 1. Metastatic adenocarcinoma lung, epidermal growth factor receptor     positive. 2. Invasive lobular carcinoma of the right breast.  CURRENT THERAPY:  Afatinib 40 mg p.o. daily, Aromasin 25 mg p.o. daily, and Zometa 4 mg IV q.4 weeks.  INTERIM HISTORY:  Ms. [name] some comes in for followup.  She continues to look quite good.  She feels well today.  She was having some issues with the back.  Ibuprofen seems to help with this.  She has not had any problems with cough.  There is no diarrhea.  She has had maybe a little constipation.  There have been no headaches.  She has had no issues with her vision.  She does state some tearing with the left eye.  She has occasional swelling in her lower legs.  She is on a diuretic that she takes every other day.  The patient has had no bleeding.  There have been no nodules or skin lesions that she has noticed on her legs.  PHYSICAL EXAMINATION:  General:  This is a well-developed, well- nourished white female in no obvious distress.  Vital Signs: Temperature of 98.4, pulse 92, respiratory rate 16, blood pressure 129/66.  Weight is 165.  Head and Neck:  Normocephalic, atraumatic skull.  There are no ocular or oral lesions.  There are no palpable cervical or supraclavicular lymph nodes.  Lungs:  Clear bilaterally. Cardiac:  Regular rate and rhythm with a normal S1, S2.  There are no murmurs, rubs, or bruits.  Abdomen:  Soft with good bowel sounds.  There is no palpable abdominal mass.  There is no fluid wave.  There is no palpable hepatosplenomegaly.  Back:  No tenderness over the spine, ribs, or hips.  Extremities:  Trace edema in her lower legs.  She has good range of motion of her joints.  Skin:  Slight erythematous rash on her face.  Neurological:  No focal neurological deficits.  LABORATORY STUDIES:  White cell count is 6.4, hemoglobin 12.1, hematocrit 37, platelet count 481.  IMPRESSION:  Ms.  [name] is a very nice 57 year old white female with epidermal growth factor receptor positive lung cancer.  We have afatinib as second-line therapy.  In fact, wee will go ahead and plan to re-scan her in 3-4 weeks.  She will be due for scans as her last scans were done in early April.  We will continue her on Zometa.  She admitted that she last had her mammogram back in April.  There was a response with this right breast lobular carcinoma.  We will see her back after her scans are done.   ______________________________ Josph Macho, M.D. PRE/MEDQ  D:  05/22/2012  T:  05/23/2012  Job:  [id number]

## 2012-06-14 ENCOUNTER — Encounter (HOSPITAL_COMMUNITY)
Admission: RE | Admit: 2012-06-14 | Discharge: 2012-06-14 | Disposition: A | Discharge status: Home / Self Care | Payer: BC Managed Care – PPO | Source: Ambulatory Visit | Attending: Hematology & Oncology | Admitting: Hematology & Oncology

## 2012-06-14 DIAGNOSIS — C787 Secondary malignant neoplasm of liver and intrahepatic bile duct: Secondary | ICD-10-CM | POA: Insufficient documentation

## 2012-06-14 DIAGNOSIS — C3490 Malignant neoplasm of unspecified part of unspecified bronchus or lung: Secondary | ICD-10-CM | POA: Insufficient documentation

## 2012-06-14 DIAGNOSIS — R599 Enlarged lymph nodes, unspecified: Secondary | ICD-10-CM | POA: Insufficient documentation

## 2012-06-14 DIAGNOSIS — R928 Other abnormal and inconclusive findings on diagnostic imaging of breast: Secondary | ICD-10-CM | POA: Insufficient documentation

## 2012-06-14 DIAGNOSIS — C7951 Secondary malignant neoplasm of bone: Secondary | ICD-10-CM | POA: Insufficient documentation

## 2012-06-14 DIAGNOSIS — M439 Deforming dorsopathy, unspecified: Secondary | ICD-10-CM | POA: Insufficient documentation

## 2012-06-14 MED ORDER — IOHEXOL 300 MG/ML  SOLN
100.0000 mL | Freq: Once | INTRAMUSCULAR | Status: AC | PRN
  Administered 2012-06-14: 100 mL via INTRAVENOUS

## 2012-06-18 ENCOUNTER — Telehealth: Payer: Self-pay | Admitting: *Deleted

## 2012-06-18 NOTE — Telephone Encounter (Signed)
Message copied by Anselm Jungling on Tue Jun 18, 2012  9:20 AM ------      Message from: Arlan Organ R      Created: Mon Jun 17, 2012  1:41 PM       I called and told her that her cancer continues to progress. As such, we will need to switch therapies. We will need to go with chemotherapy now. She has tried targeted therapy for about a year and it is now time to go with chemotherapy. She understands this. I will see her Friday and we will talk more about this. Pete ------

## 2012-06-18 NOTE — Telephone Encounter (Signed)
See documentation of Dr. Tama Gander phone call below

## 2012-06-21 ENCOUNTER — Ambulatory Visit (HOSPITAL_BASED_OUTPATIENT_CLINIC_OR_DEPARTMENT_OTHER): Payer: BC Managed Care – PPO | Admitting: Hematology & Oncology

## 2012-06-21 ENCOUNTER — Ambulatory Visit (HOSPITAL_BASED_OUTPATIENT_CLINIC_OR_DEPARTMENT_OTHER): Payer: BC Managed Care – PPO

## 2012-06-21 ENCOUNTER — Other Ambulatory Visit (HOSPITAL_BASED_OUTPATIENT_CLINIC_OR_DEPARTMENT_OTHER): Payer: BC Managed Care – PPO | Admitting: Lab

## 2012-06-21 VITALS — BP 118/65 | HR 99 | Temp 98.4°F | Resp 16 | Ht 61.0 in | Wt 165.0 lb

## 2012-06-21 DIAGNOSIS — C50419 Malignant neoplasm of upper-outer quadrant of unspecified female breast: Secondary | ICD-10-CM

## 2012-06-21 DIAGNOSIS — C3430 Malignant neoplasm of lower lobe, unspecified bronchus or lung: Secondary | ICD-10-CM

## 2012-06-21 DIAGNOSIS — C7951 Secondary malignant neoplasm of bone: Secondary | ICD-10-CM

## 2012-06-21 DIAGNOSIS — C7952 Secondary malignant neoplasm of bone marrow: Secondary | ICD-10-CM

## 2012-06-21 LAB — CMP (CANCER CENTER ONLY)
ALT(SGPT): 65 U/L — ABNORMAL HIGH (ref 10–47)
BUN, Bld: 10 mg/dL (ref 7–22)
CO2: 32 mEq/L (ref 18–33)
Calcium: 9.6 mg/dL (ref 8.0–10.3)
Chloride: 99 mEq/L (ref 98–108)
Creat: 0.8 mg/dl (ref 0.6–1.2)
Glucose, Bld: 100 mg/dL (ref 73–118)

## 2012-06-21 LAB — CBC WITH DIFFERENTIAL (CANCER CENTER ONLY)
BASO#: 0.1 10*3/uL (ref 0.0–0.2)
Eosinophils Absolute: 0.4 10*3/uL (ref 0.0–0.5)
HCT: 37.7 % (ref 34.8–46.6)
LYMPH%: 11.3 % — ABNORMAL LOW (ref 14.0–48.0)
MCH: 28.1 pg (ref 26.0–34.0)
MCV: 86 fL (ref 81–101)
MONO#: 0.9 10*3/uL (ref 0.1–0.9)
NEUT%: 75.1 % (ref 39.6–80.0)
RBC: 4.38 10*6/uL (ref 3.70–5.32)
WBC: 10.3 10*3/uL — ABNORMAL HIGH (ref 3.9–10.0)

## 2012-06-21 MED ORDER — CYANOCOBALAMIN 1000 MCG/ML IJ SOLN
1000.0000 ug | Freq: Once | INTRAMUSCULAR | Status: AC
  Administered 2012-06-21: 1000 ug via INTRAMUSCULAR

## 2012-06-21 MED ORDER — ZOLEDRONIC ACID 4 MG/100ML IV SOLN
4.0000 mg | Freq: Once | INTRAVENOUS | Status: AC
  Administered 2012-06-21: 4 mg via INTRAVENOUS
  Filled 2012-06-21: qty 100

## 2012-06-21 MED ORDER — HYDROCODONE BITARTRATE ER 20 MG PO CP12: 20.0000 mg | ORAL_CAPSULE | Freq: Two times a day (BID) | ORAL | Status: DC

## 2012-06-21 NOTE — Patient Instructions (Signed)
Zoledronic Acid injection (Hypercalcemia, Oncology) What is this medicine? ZOLEDRONIC ACID (ZOE le dron ik AS id) lowers the amount of calcium loss from bone. It is used to treat too much calcium in your blood from cancer. It is also used to prevent complications of cancer that has spread to the bone. This medicine may be used for other purposes; ask your health care provider or pharmacist if you have questions. What should I tell my health care provider before I take this medicine? They need to know if you have any of these conditions: -aspirin-sensitive asthma -dental disease -kidney disease -an unusual or allergic reaction to zoledronic acid, other medicines, foods, dyes, or preservatives -pregnant or trying to get pregnant -breast-feeding How should I use this medicine? This medicine is for infusion into a vein. It is given by a health care professional in a hospital or clinic setting. Talk to your pediatrician regarding the use of this medicine in children. Special care may be needed. Overdosage: If you think you have taken too much of this medicine contact a poison control center or emergency room at once. NOTE: This medicine is only for you. Do not share this medicine with others. What if I miss a dose? It is important not to miss your dose. Call your doctor or health care professional if you are unable to keep an appointment. What may interact with this medicine? -certain antibiotics given by injection -NSAIDs, medicines for pain and inflammation, like ibuprofen or naproxen -some diuretics like bumetanide, furosemide -teriparatide -thalidomide This list may not describe all possible interactions. Give your health care provider a list of all the medicines, herbs, non-prescription drugs, or dietary supplements you use. Also tell them if you smoke, drink alcohol, or use illegal drugs. Some items may interact with your medicine. What should I watch for while using this medicine? Visit  your doctor or health care professional for regular checkups. It may be some time before you see the benefit from this medicine. Do not stop taking your medicine unless your doctor tells you to. Your doctor may order blood tests or other tests to see how you are doing. Women should inform their doctor if they wish to become pregnant or think they might be pregnant. There is a potential for serious side effects to an unborn child. Talk to your health care professional or pharmacist for more information. You should make sure that you get enough calcium and vitamin D while you are taking this medicine. Discuss the foods you eat and the vitamins you take with your health care professional. Some people who take this medicine have severe bone, joint, and/or muscle pain. This medicine may also increase your risk for a broken thigh bone. Tell your doctor right away if you have pain in your upper leg or groin. Tell your doctor if you have any pain that does not go away or that gets worse. What side effects may I notice from receiving this medicine? Side effects that you should report to your doctor or health care professional as soon as possible: -allergic reactions like skin rash, itching or hives, swelling of the face, lips, or tongue -anxiety, confusion, or depression -breathing problems -changes in vision -feeling faint or lightheaded, falls -jaw burning, cramping, pain -muscle cramps, stiffness, or weakness -trouble passing urine or change in the amount of urine Side effects that usually do not require medical attention (report to your doctor or health care professional if they continue or are bothersome): -bone, joint, or muscle pain -  fever -hair loss -irritation at site where injected -loss of appetite -nausea, vomiting -stomach upset -tired This list may not describe all possible side effects. Call your doctor for medical advice about side effects. You may report side effects to FDA at  1-800-FDA-1088. Where should I keep my medicine? This drug is given in a hospital or clinic and will not be stored at home. NOTE: This sheet is a summary. It may not cover all possible information. If you have questions about this medicine, talk to your doctor, pharmacist, or health care provider.  2013, [employer] Standard. (07/01/2010 9:06:58 AM)  Vitamin B12 Injections Every person needs vitamin B12. A deficiency develops when the body does not get enough of it. One way to overcome this is by getting B12 shots (injections). A B12 shot puts the vitamin directly into muscle tissue. This avoids any problems your body might have in absorbing it from food or a pill. In some people, the body has trouble using the vitamin correctly. This can cause a B12 deficiency. Not consuming enough of the vitamin can also cause a deficiency. Getting enough vitamin B12 can be hard for elderly people. Sometimes, they do not eat a well-balanced diet. The elderly are also more likely than younger people to have medical conditions or take medications that can lead to a deficiency. WHAT DOES VITAMIN B12 DO? Vitamin B12 does many things to help the body work right:  It helps the body make healthy red blood cells.  It helps maintain nerve cells.  It is involved in the body's process of converting food into energy (metabolism).  It is needed to make the genetic material in all cells (DNA). VITAMIN B12 FOOD SOURCES Most people get plenty of vitamin B12 through the foods they eat. It is present in:  Meat, fish, poultry, and eggs.  Milk and milk products.  It also is added when certain foods are made, including some breads, cereals and yogurts. The food is then called "fortified". CAUSES The most common causes of vitamin B12 deficiency are:  Pernicious anemia. The condition develops when the body cannot make enough healthy red blood cells. This stems from a lack of a protein made in the stomach (intrinsic  factor). People without this protein cannot absorb enough vitamin B12 from food.  Malabsorption. This is when the body cannot absorb the vitamin. It can be caused by:  Pernicious anemia.  Surgery to remove part or all of the stomach can lead to malabsorption. Removal of part or all of the small intestine can also cause malabsorption.  Vegetarian diet. People who are strict about not eating foods from animals could have trouble taking in enough vitamin B12 from diet alone.  Medications. Some medicines have been linked to B12 deficiency, such as Metformin (a drug prescribed for type 2 diabetes). Long-term use of stomach acid suppressants also can keep the vitamin from being absorbed.  Intestinal problems such as inflammatory bowel disease. If there are problems in the digestive tract, vitamin B12 may not be absorbed in good enough amounts. SYMPTOMS People who do not get enough B12 can develop problems. These can include:  Anemia. This is when the body has too few red blood cells. Red blood cells carry oxygen to the rest of the body. Without a healthy supply of red blood cells, people can feel:  Tired (fatigued).  Weak.  Severe anemia can cause:  Shortness of breath.  Dizziness.  Rapid heart rate.  Paleness.  Other Vitamin B12 deficiency symptoms include:  Diarrhea.  Numbness or tingling in the hands or feet.  Loss of appetite.  Confusion.  Sores on the tongue or in the mouth. LET YOUR CAREGIVER KNOW ABOUT:  Any allergies. It is very important to know if you are allergic or sensitive to cobalt. Vitamin B12 contains cobalt.  Any history of kidney disease.  All medications you are taking. Include prescription and over-the-counter medicines, herbs and creams.  Whether you are pregnant or breast-feeding.  If you have Leber's disease, a hereditary eye condition, vitamin B12 could make it worse. RISKS AND COMPLICATIONS Reactions to an injection are usually temporary.  They might include:  Pain at the injection site.  Redness, swelling or tenderness at the site.  Headache, dizziness or weakness.  Nausea, upset stomach or diarrhea.  Numbness or tingling.  Fever.  Joint pain.  Itching or rash. If a reaction does not go away in a short while, talk with your healthcare provider. A change in the way the shots are given, or where they are given, might need to be made. BEFORE AN INJECTION To decide whether B12 injections are right for you, your healthcare provider will probably:  Ask about your medical history.  Ask questions about your diet.  Ask about symptoms such as:  Have you felt weak?  Do you feel unusually tired?  Do you get dizzy?  Order blood tests. These may include a test to:  Check the level of red cells in your blood.  Measure B12 levels.  Check for the presence of intrinsic factor. VITAMIN B12 INJECTIONS How often you will need a vitamin B12 injection will depend on how severe your deficiency is. This also will affect how long you will need to get them. People with pernicious anemia usually get injections for their entire life. Others might get them for a shorter period. For many people, injections are given daily or weekly for several weeks. Then, once B12 levels are normal, injections are given just once a month. If the cause of the deficiency can be fixed, the injections can be stopped. Talk with your healthcare provider about what you should expect. For an injection:  The injection site will be cleaned with an alcohol swab.  Your healthcare provider will insert a needle directly into a muscle. Most any muscle can be used. Most often, an arm muscle is used. A buttocks muscle can also be used. Many people say shots in that area are less painful.  A small adhesive bandage may be put over the injection site. It usually can be taken off in an hour or less. Injections can be given by your healthcare provider. In some cases,  family members give them. Sometimes, people give them to themselves. Talk with your healthcare provider about what would be best for you. If someone other than your healthcare provider will be giving the shots, the person will need to be trained to give them correctly. HOME CARE INSTRUCTIONS   You can remove the adhesive bandage within an hour of getting a shot.  You should be able to go about your normal activities right away.  Avoid drinking large amounts of alcohol while taking vitamin B12 shots. Alcohol can interfere with the body's use of the vitamin. SEEK MEDICAL CARE IF:   Pain, redness, swelling or tenderness at the injection site does not get better or gets worse.  Headache, dizziness or weakness does not go away.  You develop a fever of more than 100.5 F (38.1 C). SEEK IMMEDIATE MEDICAL CARE  IF:   You have chest pain.  You develop shortness of breath.  You have muscle weakness that gets worse.  You develop numbness, weakness or tingling on one side or one area of the body.  You have symptoms of an allergic reaction, such as:  Hives.  Difficulty breathing.  Swelling of the lips, face, tongue or throat.  You develop a fever of more than 102.0 F (38.9 C). MAKE SURE YOU:   Understand these instructions.  Will watch your condition.  Will get help right away if you are not doing well or get worse. Document Released: 03/31/2008 Document Revised: 03/27/2011 Document Reviewed: 03/31/2008 [facility] Patient Information 2014 [place], [place].

## 2012-06-21 NOTE — Progress Notes (Signed)
This office note has been dictated.

## 2012-06-22 NOTE — Progress Notes (Signed)
CC:   [name], M.D.  DIAGNOSES: 1. Progressive adenocarcinoma of the lung, __________ EGFR positive. 2. Invasive lobular carcinoma of the right breast.  CURRENT THERAPY: 1. The patient to start Alimta/carboplatin/Avastin next week. 2. Zometa 4 mg IV q.3-4 weeks. 3. Aromasin 25 mg p.o. daily.  INTERIM HISTORY:  Ms. Whittier comes in for her followup. Unfortunately, she continues to show progression of her cancer.  We went ahead and did a CT scan.  This was done on 06/14/2012.  The CT scan, unfortunately, showed continued progression of her left lung mass.  It now measures 6.4 x 4.7 cm.  She has other pulmonary nodules which appear to be somewhat progressing in size.  There is no pericardial effusion. She does have a mildly enlarged mediastinal lymph node.  She has a stable, I think, bony metastasis.  She does have a right hepatic lesion which now measures 2.3 cm.  We are going to have to get her on chemotherapy now.  She initially was treated with Tarceva.  She had a good response to Tarceva, but then she progressed.  We now have her on afatinib.  She really has not had a response to the afatinib.  She is working.  Her overall performance status is ECOG 1.  She does have continued pain issues.  Now that Zohydro is approved, I really think that we should try to get her on this for long-term pain control.  She is due for a brain MRI in a couple of weeks.  She has had brain radiation.  Her appetite seems to be doing okay.  She has not noticed any leg swelling.  She has had, I think, a little constipation.  She does have the lobular carcinoma of the right breast.  Her last mammogram was done back in April.  This seemed to show things were improving.  I am really not worried about this lobular breast cancer. She is on the Aromasin for this.  PHYSICAL EXAMINATION:  General:  This is a well-developed, well- nourished white female in no obvious distress.  Vital  signs: Temperature of 98.4, pulse 99, respiratory rate 16, blood pressure 118/65.  Weight is 165.  Head and neck:  Normocephalic, atraumatic skull.  There are no ocular or oral lesions.  There are no palpable cervical or supraclavicular lymph nodes.  Lungs:  Clear bilaterally. Cardiac:  Regular rate and rhythm, with a normal S1, S2.  There are no murmurs, rubs, or bruits.  Abdomen:  Soft with good bowel sounds.  There is no palpable abdominal mass.  There is no fluid wave.  There is no palpable hepatosplenomegaly.  Extremities:  Show no clubbing, cyanosis or edema.  She has good strength in her legs.  Neurological:  Shows no focal neurological deficits.  LABORATORY STUDIES:  White cell count is 10.2, hemoglobin 12.3, hematocrit 37.7, platelet count 533,000.  IMPRESSION:  Ms. Dworkin is a very nice 57 year old white female.  She has metastatic adenocarcinoma of the lung.  Despite the fact that her tumor is EGFR positive, she is progressing.  We are going to have to get her on chemotherapy now.  I will try carboplatin/Alimta/Avastin.  Her brain mets have been treated, so I do not worry about these with the Avastin.  I gave Ms. Gunkel information sheets about all 3 chemotherapy drugs.  We will go ahead and give her vitamin B12 today.  She will need to have a Port-A-Cath placed.  We will have to get this set up next  week.  We will start chemotherapy on her the week of 07/01/2012.  She wants to have a nutrition consult.  We will see about getting one of the dietitians at the main cancer center to see her.  Ms. Gosser is almost a year out from initial diagnosis.  She has done incredibly well.  We will continue to use Zometa on her.  I will plan to see her back for the start of her 2nd cycle of chemotherapy.  We will give her 2 cycles of chemo and then we will repeat her scans.    ______________________________ Josph Macho, M.D. PRE/MEDQ  D:  06/21/2012  T:   06/22/2012  Job:  [id number]

## 2012-06-24 ENCOUNTER — Encounter: Payer: Self-pay | Admitting: Radiation Oncology

## 2012-06-24 NOTE — Progress Notes (Unsigned)
Per [place], [name] [phone] no pre-cert required

## 2012-06-25 ENCOUNTER — Encounter: Payer: Self-pay | Admitting: *Deleted

## 2012-06-25 ENCOUNTER — Other Ambulatory Visit: Payer: BC Managed Care – PPO

## 2012-06-27 ENCOUNTER — Other Ambulatory Visit: Payer: Self-pay | Admitting: Radiology

## 2012-06-27 ENCOUNTER — Ambulatory Visit: Payer: BC Managed Care – PPO | Admitting: Nutrition

## 2012-06-27 NOTE — Progress Notes (Signed)
This patient is a 57 year old female diagnosed with metastatic lung cancer. She is a patient of Dr. Myna Hidalgo.  Past medical history includes IBS, pulmonary nodule, breast cancer, and compression fracture.  Medications include Xanax, Imodium, Phenergan, and vitamin E.  Labs were reviewed.  Height: 61 inches. Weight: 165 pounds. Usual body weight: 157 pounds. BMI: 31.19.  Patient is interested in information on healthy eating during chemotherapy. She reports currently she doesn't have a very good appetite. She is eating despite this. Her weight has increased after discontinuing steroids.  Nutrition diagnosis: Food and nutrition related knowledge deficit related to diagnosis of lung cancer and associated treatments as evidenced by no prior need for nutrition related information.  Intervention: Patient was educated to consume small, frequent, high-calorie, high-protein meals to promote weight maintenance. I educated patient on sources of protein foods. I educated her on strategies for eating if she gets nauseous. We discussed strategies for avoiding constipation. I've encouraged patient to consider an oral nutrition supplement such as Carnation breakfast or ensure or boost as needed to help weight maintenance. Teach back method used. Fact sheets provided. Questions were answered.  Monitoring, evaluation, goals: Patient will tolerate adequate calories and protein to minimize loss of lean body mass throughout treatment.  Next visit: Patient was encouraged to call me if she has questions or concerns.

## 2012-06-28 ENCOUNTER — Encounter (HOSPITAL_COMMUNITY): Payer: Self-pay | Admitting: Pharmacy Technician

## 2012-06-28 ENCOUNTER — Other Ambulatory Visit: Payer: Self-pay | Admitting: *Deleted

## 2012-06-28 DIAGNOSIS — C3492 Malignant neoplasm of unspecified part of left bronchus or lung: Secondary | ICD-10-CM

## 2012-06-28 MED ORDER — PROCHLORPERAZINE MALEATE 10 MG PO TABS: 10.0000 mg | ORAL_TABLET | Freq: Four times a day (QID) | ORAL | Status: DC | PRN

## 2012-07-01 ENCOUNTER — Encounter (HOSPITAL_COMMUNITY): Payer: Self-pay | Admitting: Pharmacy Technician

## 2012-07-01 ENCOUNTER — Telehealth: Payer: Self-pay | Admitting: Hematology & Oncology

## 2012-07-01 NOTE — Telephone Encounter (Signed)
ONDANSETRON HCL 8 mg tablet APPROVED 07/05/2012 - 07/01/2013  Auth# [id number]

## 2012-07-02 ENCOUNTER — Ambulatory Visit (HOSPITAL_COMMUNITY): Admission: RE | Admit: 2012-07-02 | Payer: BC Managed Care – PPO | Source: Ambulatory Visit

## 2012-07-02 ENCOUNTER — Other Ambulatory Visit: Payer: Self-pay | Admitting: Hematology & Oncology

## 2012-07-02 ENCOUNTER — Ambulatory Visit (HOSPITAL_COMMUNITY)
Admission: RE | Admit: 2012-07-02 | Discharge: 2012-07-02 | Disposition: A | Discharge status: Home / Self Care | Payer: BC Managed Care – PPO | Source: Ambulatory Visit | Attending: Hematology & Oncology | Admitting: Hematology & Oncology

## 2012-07-02 ENCOUNTER — Inpatient Hospital Stay (HOSPITAL_COMMUNITY): Admission: RE | Admit: 2012-07-02 | Payer: BC Managed Care – PPO | Source: Ambulatory Visit

## 2012-07-02 ENCOUNTER — Encounter (HOSPITAL_COMMUNITY): Payer: Self-pay

## 2012-07-02 DIAGNOSIS — C7951 Secondary malignant neoplasm of bone: Secondary | ICD-10-CM

## 2012-07-02 DIAGNOSIS — C3490 Malignant neoplasm of unspecified part of unspecified bronchus or lung: Secondary | ICD-10-CM | POA: Insufficient documentation

## 2012-07-02 LAB — CBC
Hemoglobin: 11.5 g/dL — ABNORMAL LOW (ref 12.0–15.0)
MCHC: 33 g/dL (ref 30.0–36.0)
RDW: 14.3 % (ref 11.5–15.5)
WBC: 9 10*3/uL (ref 4.0–10.5)

## 2012-07-02 LAB — PROTIME-INR
INR: 0.96 (ref 0.00–1.49)
Prothrombin Time: 12.7 seconds (ref 11.6–15.2)

## 2012-07-02 LAB — APTT: aPTT: 32 seconds (ref 24–37)

## 2012-07-02 MED ORDER — LIDOCAINE HCL 1 % IJ SOLN
INTRAMUSCULAR | Status: AC
  Filled 2012-07-02: qty 20

## 2012-07-02 MED ORDER — FENTANYL CITRATE 0.05 MG/ML IJ SOLN
INTRAMUSCULAR | Status: AC | PRN
  Administered 2012-07-02: 50 ug via INTRAVENOUS
  Administered 2012-07-02: 100 ug via INTRAVENOUS

## 2012-07-02 MED ORDER — FENTANYL CITRATE 0.05 MG/ML IJ SOLN
INTRAMUSCULAR | Status: AC
  Filled 2012-07-02: qty 4

## 2012-07-02 MED ORDER — SODIUM CHLORIDE 0.9 % IV SOLN
INTRAVENOUS | Status: DC
  Administered 2012-07-02: 500 mL via INTRAVENOUS

## 2012-07-02 MED ORDER — CEFAZOLIN SODIUM-DEXTROSE 2-3 GM-% IV SOLR
2.0000 g | INTRAVENOUS | Status: AC
  Administered 2012-07-02: 2 g via INTRAVENOUS
  Filled 2012-07-02: qty 50

## 2012-07-02 MED ORDER — HEPARIN SOD (PORK) LOCK FLUSH 100 UNIT/ML IV SOLN: 500.0000 [IU] | Freq: Once | INTRAVENOUS | Status: DC

## 2012-07-02 MED ORDER — MIDAZOLAM HCL 2 MG/2ML IJ SOLN
INTRAMUSCULAR | Status: AC | PRN
  Administered 2012-07-02: 2 mg via INTRAVENOUS
  Administered 2012-07-02: 1 mg via INTRAVENOUS

## 2012-07-02 MED ORDER — MIDAZOLAM HCL 2 MG/2ML IJ SOLN
INTRAMUSCULAR | Status: AC
  Filled 2012-07-02: qty 4

## 2012-07-02 NOTE — Procedures (Addendum)
(  R IJ port No complication No blood loss. See complete dictation in [facility].

## 2012-07-02 NOTE — H&P (Signed)
Chief Complaint: "I'm here for a port" Referring Physician:Ennever HPI: [name] is an 57 y.o. female with lung cancer who will be starting chemotherapy. She is scheduled for port placement PMHx and meds reviewed. Feels well otherwise.  Past Medical History:  Past Medical History  Diagnosis Date  . Pulmonary nodules 06/22/11    per CT scan  . IBS (irritable bowel syndrome)   . Fibroadenoma of breast     hx of  . Hx of radiation therapy 07/05/11 -07/21/11    LS, Tspine  . Hx of radiation therapy  completed7/3/13    brain, SRS  . Headache(784.0)   . Metastasis of unknown origin   . Adenocarcinoma of lung   . 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 Breast cancer 01/04/12  . Metastatic lung cancer     Epidermal Growth Factor Positive - Afatinib  . Compression fracture     Multiple - Kyphoplasty    Past Surgical History:  Past Surgical History  Procedure Laterality Date  . Lung biopsy    . Vertebroplasty    . Vertebroplasty    . Breast biopsy  2013    right breast    Family History:  Family History  Problem Relation Age of Onset  . Cancer Mother     cervical  . Asthma Father   . Heart disease Maternal Grandmother   . Cancer Maternal Grandfather     skin  . Cancer Paternal Grandmother     pancreatic  . Heart disease Paternal Grandfather     Social History:  reports that she has never smoked. She has never used smokeless tobacco. She reports that she does not drink alcohol or use illicit drugs.  Allergies: No Known Allergies  Medications: ALPRAZolam (XANAX) 0.25 MG tablet (Taking) Sig - Route: Take 0.25-0.5 mg by mouth every 8 (eight) hours as needed. For anxiety. - Oral Class: Historical Med Number of times this order has been changed since signing: 1 Order [address] Doxylamine Succinate, Sleep, (UNISOM PO) (Taking) Sig - Route: Take 1 tablet by mouth at bedtime as needed (sleep). - Oral Class: Historical Med Number of  times this order has been changed since signing: 2 Order [address] exemestane (AROMASIN) 25 MG tablet (Taking) 30 tablet 6 01/24/2012 Sig - Route: Take 1 tablet (25 mg total) by mouth daily after breakfast. - Oral Number of times this order has been changed since signing: 1 Order [address] folic acid (FOLVITE) 1 MG tablet (Taking) Sig - Route: Take 1 mg by mouth daily. - Oral Class: Historical Med HYDROcodone-acetaminophen (NORCO) 10-325 MG per tablet (Taking) Sig - Route: Take 1 tablet by mouth every 6 (six) hours as needed for pain. - Oral Class: Historical Med ibuprofen (ADVIL,MOTRIN) 200 MG tablet (Taking) Sig - Route: Take 600 mg by mouth every 6 (six) hours as needed for pain. - Oral Class: Historical Med Number of times this order has been changed since signing: 2 Order [address] loperamide (IMODIUM) 2 MG capsule (Taking) Sig - Route: Take 2 mg by mouth 4 (four) times daily as needed. For diarrhea - Oral Class: Historical Med Number of times this order has been changed since signing: 1 Order [address] loratadine (CLARITIN) 10 MG tablet (Taking) Sig - Route: Take 10 mg by mouth daily. - Oral Class: Historical Med Number of times this order has been changed since signing: 1 Order [address] pentoxifylline (TRENTAL) 400 MG CR tablet (Taking) 01/01/2012  Sig - Route: Take 400 mg by mouth every evening. - Oral Class: Historical Med Number of times this order has been changed since signing: 3 Order [address] promethazine (PHENERGAN) 12.5 MG tablet (Taking) 07/03/2011 Sig - Route: Take 12.5 mg by mouth daily as needed. for nausea - Oral Class: Historical Med Number of times this order has been changed since signing: 1 Order [address] Skin Protectants, Misc. (EUCERIN) cream (Taking) Sig - Route: Apply 1 application topically every morning. - Topical Class: Historical Med triamterene-hydrochlorothiazide (MAXZIDE-25) 37.5-25 MG per tablet (Taking) Sig - Route: Take 1 tablet by mouth every morning. - Oral  Class: Historical Med vitamin E 400 UNIT capsule (Taking) Sig - Route: Take 400 Units by mouth daily. - Oral Class: Historical Med zolendronic acid (ZOMETA) 4 MG/5ML injection (Taking) Sig - Route: Inject 4 mg into the vein every 30 (thirty) days    Please HPI for pertinent positives, otherwise complete 10 system ROS negative.  Physical Exam: There were no vitals taken for this visit. There is no weight on file to calculate BMI.   General Appearance:  Alert, cooperative, no distress, appears stated age  Head:  Normocephalic, without obvious abnormality, atraumatic  ENT: Unremarkable  Neck: Supple, symmetrical, trachea midline  Lungs:   Clear to auscultation bilaterally, no w/r/r, respirations unlabored without use of accessory muscles.  Chest Wall:  No tenderness or deformity  Heart:  Regular rate and rhythm, S1, S2 normal, no murmur, rub or gallop.  Neurologic: Normal affect, no gross deficits.   Results for orders placed during the hospital encounter of 07/02/12 (from the past 48 hour(s))  APTT     Status: None   Collection Time    07/02/12  8:05 AM      Result Value Range   aPTT 32  24 - 37 seconds  CBC     Status: Abnormal   Collection Time    07/02/12  8:05 AM      Result Value Range   WBC 9.0  4.0 - 10.5 K/uL   RBC 4.24  3.87 - 5.11 MIL/uL   Hemoglobin 11.5 (*) 12.0 - 15.0 g/dL   HCT 16.1 (*) 09.6 - 04.5 %   MCV 82.3  78.0 - 100.0 fL   MCH 27.1  26.0 - 34.0 pg   MCHC 33.0  30.0 - 36.0 g/dL   RDW 40.9  81.1 - 91.4 %   Platelets 551 (*) 150 - 400 K/uL  PROTIME-INR     Status: None   Collection Time    07/02/12  8:05 AM      Result Value Range   Prothrombin Time 12.7  11.6 - 15.2 seconds   INR 0.96  0.00 - 1.49   No results found.  Assessment/Plan Lung cancer For Port placement. Risks, complications, use of sedation. Labs reviewed, ok Consent signed in chart  Brayton El PA-C 07/02/2012, 8:20 AM

## 2012-07-03 ENCOUNTER — Ambulatory Visit (HOSPITAL_BASED_OUTPATIENT_CLINIC_OR_DEPARTMENT_OTHER): Payer: BC Managed Care – PPO

## 2012-07-03 ENCOUNTER — Other Ambulatory Visit (HOSPITAL_BASED_OUTPATIENT_CLINIC_OR_DEPARTMENT_OTHER): Payer: BC Managed Care – PPO | Admitting: Lab

## 2012-07-03 ENCOUNTER — Other Ambulatory Visit: Payer: BC Managed Care – PPO | Admitting: Lab

## 2012-07-03 VITALS — BP 138/82 | HR 86 | Temp 97.1°F | Resp 20

## 2012-07-03 DIAGNOSIS — C3492 Malignant neoplasm of unspecified part of left bronchus or lung: Secondary | ICD-10-CM

## 2012-07-03 DIAGNOSIS — Z5111 Encounter for antineoplastic chemotherapy: Secondary | ICD-10-CM

## 2012-07-03 DIAGNOSIS — C50419 Malignant neoplasm of upper-outer quadrant of unspecified female breast: Secondary | ICD-10-CM

## 2012-07-03 DIAGNOSIS — Z5112 Encounter for antineoplastic immunotherapy: Secondary | ICD-10-CM

## 2012-07-03 DIAGNOSIS — C3430 Malignant neoplasm of lower lobe, unspecified bronchus or lung: Secondary | ICD-10-CM

## 2012-07-03 DIAGNOSIS — C3490 Malignant neoplasm of unspecified part of unspecified bronchus or lung: Secondary | ICD-10-CM

## 2012-07-03 DIAGNOSIS — C7951 Secondary malignant neoplasm of bone: Secondary | ICD-10-CM

## 2012-07-03 LAB — UA PROTEIN, DIPSTICK - CHCC SATELLITE: Protein, Urine: NEGATIVE mg/dL

## 2012-07-03 MED ORDER — CYANOCOBALAMIN 1000 MCG/ML IJ SOLN: 1000.0000 ug | INTRAMUSCULAR | Status: DC

## 2012-07-03 MED ORDER — SODIUM CHLORIDE 0.9 % IV SOLN
Freq: Once | INTRAVENOUS | Status: AC
  Administered 2012-07-03: 09:00:00 via INTRAVENOUS

## 2012-07-03 MED ORDER — ONDANSETRON 16 MG/50ML IVPB (CHCC)
16.0000 mg | Freq: Once | INTRAVENOUS | Status: AC
  Administered 2012-07-03: 16 mg via INTRAVENOUS

## 2012-07-03 MED ORDER — HEPARIN SOD (PORK) LOCK FLUSH 100 UNIT/ML IV SOLN
500.0000 [IU] | Freq: Once | INTRAVENOUS | Status: AC | PRN
  Administered 2012-07-03: 500 [IU]
  Filled 2012-07-03: qty 5

## 2012-07-03 MED ORDER — SODIUM CHLORIDE 0.9 % IV SOLN
15.0000 mg/kg | Freq: Once | INTRAVENOUS | Status: AC
  Administered 2012-07-03: 1125 mg via INTRAVENOUS
  Filled 2012-07-03: qty 45

## 2012-07-03 MED ORDER — SODIUM CHLORIDE 0.9 % IV SOLN
500.0000 mg | Freq: Once | INTRAVENOUS | Status: AC
  Administered 2012-07-03: 500 mg via INTRAVENOUS
  Filled 2012-07-03: qty 50

## 2012-07-03 MED ORDER — DEXAMETHASONE SODIUM PHOSPHATE 20 MG/5ML IJ SOLN
20.0000 mg | Freq: Once | INTRAMUSCULAR | Status: AC
  Administered 2012-07-03: 20 mg via INTRAVENOUS
  Filled 2012-07-03: qty 5

## 2012-07-03 MED ORDER — LORAZEPAM 0.5 MG PO TABS: 0.5000 mg | ORAL_TABLET | Freq: Four times a day (QID) | ORAL | Status: DC | PRN

## 2012-07-03 MED ORDER — SODIUM CHLORIDE 0.9 % IJ SOLN
10.0000 mL | INTRAMUSCULAR | Status: DC | PRN
  Administered 2012-07-03: 10 mL
  Filled 2012-07-03: qty 10

## 2012-07-03 MED ORDER — SODIUM CHLORIDE 0.9 % IV SOLN: 15.0000 mg/kg | Freq: Once | INTRAVENOUS | Status: DC

## 2012-07-03 MED ORDER — SODIUM CHLORIDE 0.9 % IV SOLN
500.0000 mg/m2 | Freq: Once | INTRAVENOUS | Status: AC
  Administered 2012-07-03: 900 mg via INTRAVENOUS
  Filled 2012-07-03: qty 36

## 2012-07-03 NOTE — Patient Instructions (Signed)
[facility] [address], [address] [place], [place]  [address] Discharge Instructions for Patients Receiving Chemotherapy  Today you received the following chemotherapy agents:  Alimta, Carboplatin, and Avastin.  To help prevent nausea and vomiting after your treatment, we encourage you to take your nausea medications as ordered. If you develop nausea and vomiting that is not controlled by your nausea medication, call the clinic. If it is after clinic hours your family physician or the after hours number for the clinic or go to the Emergency Department.   BELOW ARE SYMPTOMS THAT SHOULD BE REPORTED IMMEDIATELY:  *FEVER GREATER THAN 100.5 F  *CHILLS WITH OR WITHOUT FEVER  NAUSEA AND VOMITING THAT IS NOT CONTROLLED WITH YOUR NAUSEA MEDICATION  *UNUSUAL SHORTNESS OF BREATH  *UNUSUAL BRUISING OR BLEEDING  TENDERNESS IN MOUTH AND THROAT WITH OR WITHOUT PRESENCE OF ULCERS  *URINARY PROBLEMS  *BOWEL PROBLEMS  UNUSUAL RASH Items with * indicate a potential emergency and should be followed up as soon as possible.   Feel free to call the clinic you have any questions or concerns. The clinic phone number is [phone].[facility] Discharge Instructions for Patients Receiving Chemotherapy

## 2012-07-04 ENCOUNTER — Encounter: Payer: Self-pay | Admitting: Hematology & Oncology

## 2012-07-12 ENCOUNTER — Ambulatory Visit
Admission: RE | Admit: 2012-07-12 | Discharge: 2012-07-12 | Disposition: A | Discharge status: Home / Self Care | Payer: BC Managed Care – PPO | Source: Ambulatory Visit | Attending: Radiation Oncology | Admitting: Radiation Oncology

## 2012-07-12 DIAGNOSIS — C7949 Secondary malignant neoplasm of other parts of nervous system: Secondary | ICD-10-CM

## 2012-07-12 MED ORDER — GADOBENATE DIMEGLUMINE 529 MG/ML IV SOLN
15.0000 mL | Freq: Once | INTRAVENOUS | Status: AC | PRN
  Administered 2012-07-12: 15 mL via INTRAVENOUS

## 2012-07-13 ENCOUNTER — Encounter: Payer: Self-pay | Admitting: Radiation Oncology

## 2012-07-15 ENCOUNTER — Ambulatory Visit
Admission: RE | Admit: 2012-07-15 | Discharge: 2012-07-15 | Disposition: A | Discharge status: Home / Self Care | Payer: BC Managed Care – PPO | Source: Ambulatory Visit | Attending: Radiation Oncology | Admitting: Radiation Oncology

## 2012-07-15 ENCOUNTER — Encounter: Payer: Self-pay | Admitting: Radiation Oncology

## 2012-07-15 VITALS — BP 134/91 | HR 82 | Temp 98.2°F | Resp 20 | Wt 166.9 lb

## 2012-07-15 DIAGNOSIS — C7931 Secondary malignant neoplasm of brain: Secondary | ICD-10-CM

## 2012-07-15 HISTORY — DX: Personal history of irradiation: Z92.3

## 2012-07-15 HISTORY — DX: Disorder of brain, unspecified: G93.9

## 2012-07-15 NOTE — Progress Notes (Addendum)
Pt denies pain, HA, n/v, blurred vision, unsteadiness, dizziness. She reports fatigue from chemo last Friday. She is on Trental.

## 2012-07-17 ENCOUNTER — Encounter: Payer: Self-pay | Admitting: Radiation Oncology

## 2012-07-17 NOTE — Progress Notes (Signed)
Radiation Oncology         [phone]) [phone] ________________________________  Name: [name] MRN: [id number]  Date: 07/15/2012  DOB: 07-12-1955  Follow-Up Visit Note  Outpatient  CC: [name], MD  [name], MD   Diagnosis: 1. 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   2. Invasive lobular carcinoma of the right breast.   Interval Since Last Radiation: Interval Since Last Radiation: She completed stereotactic radiosurgery to the right frontal, right thalamic, left frontal and left occipital target, 20 Gray in 1 fraction to each target, on 07/19/11.   Narrative:  The patient returns today for routine follow-up.          Most recent MRI of the brain on 07/12/12 demonstrates 6 new metastases as discussed this morning at brian tumor conference: 1) 5.79mm left temporal lesion 2) 3.8 mm right superior vermian lesion 3) 3.0 mm left occipital lesion 4) 2.7 mm left cerebellar lesion 5) 2.7 mm right anterior temporal lesion 6) 2.1 mm posterior left superior frontal lesion  The right frontal 10mm treated lesion looks better; the other treated lesions are not visible per our discussion this AM.  Symptomatically,denies pain, HA, n/v, blurred vision, unsteadiness, dizziness. She reports fatigue from chemo last Friday. She is on Trental  Status of systemic system per CT scan 1 mo ago demonstrates interval progression in the chest, and the right hepatic lobe. Bone mets are stable.  Medical oncology's plans for the patient are to continue  Zometa, and take carboplatin/Alimta/Avastin - will reassess with imaging after 2 cycles.     ALLERGIES:  has No Known Allergies.  Meds: Current Outpatient Prescriptions  Medication Sig Dispense Refill  . ALPRAZolam (XANAX) 0.25 MG tablet Take 0.25-0.5 mg by mouth every 8 (eight) hours as needed. For anxiety.      . Doxylamine Succinate, Sleep, (UNISOM PO) Take 1 tablet by mouth at  bedtime as needed (sleep).       Marland Kitchen exemestane (AROMASIN) 25 MG tablet Take 1 tablet (25 mg total) by mouth daily after breakfast.  30 tablet  6  . folic acid (FOLVITE) 1 MG tablet Take 1 mg by mouth daily.      Marland Kitchen HYDROcodone-acetaminophen (NORCO) 10-325 MG per tablet Take 1 tablet by mouth every 6 (six) hours as needed for pain.      Marland Kitchen ibuprofen (ADVIL,MOTRIN) 200 MG tablet Take 600 mg by mouth every 6 (six) hours as needed for pain.       Marland Kitchen loperamide (IMODIUM) 2 MG capsule Take 2 mg by mouth 4 (four) times daily as needed. For diarrhea      . loratadine (CLARITIN) 10 MG tablet Take 10 mg by mouth daily.      Marland Kitchen LORazepam (ATIVAN) 0.5 MG tablet Take 1 tablet (0.5 mg total) by mouth every 6 (six) hours as needed (Nausea or vomiting).  30 tablet  0  . pentoxifylline (TRENTAL) 400 MG CR tablet Take 400 mg by mouth every evening.       . promethazine (PHENERGAN) 12.5 MG tablet Take 12.5 mg by mouth daily as needed. for nausea      . Skin Protectants, Misc. (EUCERIN) cream Apply 1 application topically every morning.      . triamterene-hydrochlorothiazide (MAXZIDE-25) 37.5-25 MG per tablet Take 1 tablet by mouth every morning.      . vitamin E 400 UNIT capsule Take 400 Units by mouth daily.      Marland Kitchen zolendronic acid (ZOMETA) 4  MG/5ML injection Inject 4 mg into the vein every 30 (thirty) days.       No current facility-administered medications for this encounter.    Physical Findings: The patient is in no acute distress. Patient is alert and oriented.  weight is 166 lb 14.4 oz (75.705 kg). Her temperature is 98.2 F (36.8 C). Her blood pressure is 134/91 and her pulse is 82. Her respiration is 20. Marland Kitchen  NAD CN grossly intact. Strength, sensation, coordination are intact bilaterally Answering questions appropriately.  Lab Findings: Lab Results  Component Value Date   WBC 9.0 07/02/2012   HGB 11.5* 07/02/2012   HCT 34.9* 07/02/2012   MCV 82.3 07/02/2012   PLT 551* 07/02/2012    CMP     Component  Value Date/Time   NA 143 06/21/2012 0803   NA 138 02/21/2012 1136   K 3.7 06/21/2012 0803   K 3.9 02/21/2012 1136   CL 99 06/21/2012 0803   CL 98 02/21/2012 1136   CO2 32 06/21/2012 0803   CO2 28 02/21/2012 1136   GLUCOSE 100 06/21/2012 0803   GLUCOSE 89 02/21/2012 1136   BUN 10 06/21/2012 0803   BUN 11 02/21/2012 1136   CREATININE 0.8 06/21/2012 0803   CREATININE 0.75 02/21/2012 1136   CALCIUM 9.6 06/21/2012 0803   CALCIUM 9.7 02/21/2012 1136   PROT 7.5 06/21/2012 0803   PROT 6.6 02/21/2012 1136   ALBUMIN 3.9 02/21/2012 1136   AST 60* 06/21/2012 0803   AST 23 02/21/2012 1136   ALT 17 02/21/2012 1136   ALKPHOS 257* 06/21/2012 0803   ALKPHOS 157* 02/21/2012 1136   BILITOT 0.50 06/21/2012 0803   BILITOT 0.5 02/21/2012 1136   GFRNONAA >90 12/13/2011 0755   GFRAA >90 12/13/2011 0755      Radiographic Findings: Mr Laqueta Jean Wo Contrast  07/12/2012   *RADIOLOGY REPORT*  Clinical Data: Restaging.  S R S follow-up.  MRI HEAD WITHOUT AND WITH CONTRAST  Technique:  Multiplanar, multiecho pulse sequences of the brain and surrounding structures were obtained according to standard protocol without and with intravenous contrast  Contrast: 15mL MULTIHANCE GADOBENATE DIMEGLUMINE 529 MG/ML IV SOLN  Comparison: Multiple priors.  Most recent 04/08/2012.  Findings: No acute stroke or hemorrhage.  No hydrocephalus or extra- axial fluid.  Improved edema and mild restricted diffusion associated with right inferior frontal gyrus / uncinate fasciculus lesion.  On prior studies, this area was concerning for post treatment effect/radiation necrosis, but shows marked interval improvement today.  The overall area of enhancement measures 5 x 11 mm, and the intensity of enhancement is also diminished from priors.  Other areas of concern for progression of metastatic disease are listed below, from inferior to superior.  Image numbers refer to post contrast series #10  - 3 mm, left mid cerebellum, image 42. - 5 mm, left medial temporal lobe, image 45. - 4 mm, right  superior cerebellum, image 50. - 3 mm, right anterior temporal lobe, image 53. - See discussion above for right frontal lesion, image 70. - 2 mm, left parietal cortex, image 114.  Left posterior frontal lobe lesion no longer seen.  Right thalamic lesion now no longer seen as well.  Unchanged ancillary findings of normal extracranial soft tissues and patent intracranial vascular structures.  IMPRESSION: Improved left posterior frontal and right thalamic metastases.  Improved inferior right frontal lobe lesion which showed features which were previously concerning for post treatment effect.  At least 5 new intracranial lesions as described above.  Original Report Authenticated By: Davonna Belling, M.D.   Ir Fluoro Guide Cv Line Right  07/02/2012   *RADIOLOGY REPORT*  Clinical data: Lung carcinoma, needs access for chemotherapy.  TUNNELED PORT CATHETER PLACEMENT WITH ULTRASOUND AND FLUOROSCOPIC GUIDANCE  Technique: The procedure, risks, benefits, and alternatives were explained to the patient.  Questions regarding the procedure were encouraged and answered.  The patient understands and consents to the procedure.  As antibiotic prophylaxis, cefazolin 2 grams was ordered pre- procedure and administered intravenously within one hour of incision.  Patency of the right IJ vein was confirmed with ultrasound with image documentation. An appropriate skin site was determined. Skin site was marked. Region was prepped using maximum barrier technique including cap and mask, sterile gown, sterile gloves, large sterile sheet, and Chlorhexidine   as cutaneous antisepsis.   The region was infiltrated locally with 1% lidocaine.  Intravenous Fentanyl and Versed were administered as conscious sedation during continuous cardiorespiratory monitoring by the radiology RN, with a total moderate sedation time of 25 minutes.  Under real-time ultrasound guidance, the right IJ vein was accessed with a 21 gauge micropuncture needle; the needle tip  within the vein was confirmed with ultrasound image documentation.   Needle was exchanged over a 018 guidewire for transitional dilator which allowed passage of the [facility] wire into the IVC. Over this, the transitional dilator was exchanged for a 5 [place] MPA catheter. A small incision was made on the right anterior chest wall and a subcutaneous pocket fashioned. The power-injectable port was positioned and its catheter tunneled to the right IJ dermatotomy site. The MPA catheter was exchanged over an Amplatz wire for a peel-away sheath, through which the port catheter, which had been trimmed to the appropriate length, was advanced and positioned under fluoroscopy with its tip at the cavoatrial junction. Spot chest radiograph  confirms good catheter position and no pneumothorax. The pocket was closed with deep interrupted and subcuticular continuous 3-0 Monocryl sutures. The port was  flushed per protocol. The incisions were covered with Dermabond then covered with a sterile dressing. No immediate complication.  Fluoroscopy time: 24 seconds  IMPRESSION Technically successful right IJ power-injectable port catheter placement. Ready for routine use.   Original Report Authenticated By: D. Andria Rhein, MD   Ir US Guide Vasc Access Right  07/02/2012   *RADIOLOGY REPORT*  Clinical data: Lung carcinoma, needs access for chemotherapy.  TUNNELED PORT CATHETER PLACEMENT WITH ULTRASOUND AND FLUOROSCOPIC GUIDANCE  Technique: The procedure, risks, benefits, and alternatives were explained to the patient.  Questions regarding the procedure were encouraged and answered.  The patient understands and consents to the procedure.  As antibiotic prophylaxis, cefazolin 2 grams was ordered pre- procedure and administered intravenously within one hour of incision.  Patency of the right IJ vein was confirmed with ultrasound with image documentation. An appropriate skin site was determined. Skin site was marked. Region was prepped using  maximum barrier technique including cap and mask, sterile gown, sterile gloves, large sterile sheet, and Chlorhexidine   as cutaneous antisepsis.   The region was infiltrated locally with 1% lidocaine.  Intravenous Fentanyl and Versed were administered as conscious sedation during continuous cardiorespiratory monitoring by the radiology RN, with a total moderate sedation time of 25 minutes.  Under real-time ultrasound guidance, the right IJ vein was accessed with a 21 gauge micropuncture needle; the needle tip within the vein was confirmed with ultrasound image documentation.   Needle was exchanged over a 018 guidewire for transitional dilator which allowed passage  of the [facility] wire into the IVC. Over this, the transitional dilator was exchanged for a 5 [place] MPA catheter. A small incision was made on the right anterior chest wall and a subcutaneous pocket fashioned. The power-injectable port was positioned and its catheter tunneled to the right IJ dermatotomy site. The MPA catheter was exchanged over an Amplatz wire for a peel-away sheath, through which the port catheter, which had been trimmed to the appropriate length, was advanced and positioned under fluoroscopy with its tip at the cavoatrial junction. Spot chest radiograph  confirms good catheter position and no pneumothorax. The pocket was closed with deep interrupted and subcuticular continuous 3-0 Monocryl sutures. The port was  flushed per protocol. The incisions were covered with Dermabond then covered with a sterile dressing. No immediate complication.  Fluoroscopy time: 24 seconds  IMPRESSION Technically successful right IJ power-injectable port catheter placement. Ready for routine use.   Original Report Authenticated By: D. Andria Rhein, MD    Impression/Plan:   At her 1 year followup from [facility] to the brain, she presents with 6 new subcentimeter lesions consistent with new brain metastases. While whole brain radiotherapy is an option, she would  prefer to delay or avoid the side effects of this for as long as possible. The consensus at our tumor Board is that she remains a good candidate for salvage strategic radiosurgery. We will simulate her early next week (due to other family commitments this week) for treatment to the 6 lesions. Consent signed today following detailed discussion of potential risks, benefits, and side effects. She is enthusiastic about this plan   I spent 15 minutes minutes face to face with the patient and more than 50% of that time was spent in counseling and/or coordination of care. _____________________________________   Lonie Peak, MD

## 2012-07-22 ENCOUNTER — Ambulatory Visit
Admission: RE | Admit: 2012-07-22 | Discharge: 2012-07-22 | Disposition: A | Discharge status: Home / Self Care | Payer: BC Managed Care – PPO | Source: Ambulatory Visit | Attending: Radiation Oncology | Admitting: Radiation Oncology

## 2012-07-22 ENCOUNTER — Inpatient Hospital Stay
Admission: RE | Admit: 2012-07-22 | Discharge: 2012-07-22 | Disposition: A | Discharge status: Home / Self Care | Payer: Self-pay | Source: Ambulatory Visit | Attending: Radiation Oncology | Admitting: Radiation Oncology

## 2012-07-22 DIAGNOSIS — C7949 Secondary malignant neoplasm of other parts of nervous system: Secondary | ICD-10-CM | POA: Insufficient documentation

## 2012-07-22 DIAGNOSIS — C7931 Secondary malignant neoplasm of brain: Secondary | ICD-10-CM | POA: Insufficient documentation

## 2012-07-22 DIAGNOSIS — C3490 Malignant neoplasm of unspecified part of unspecified bronchus or lung: Secondary | ICD-10-CM | POA: Insufficient documentation

## 2012-07-22 DIAGNOSIS — C50919 Malignant neoplasm of unspecified site of unspecified female breast: Secondary | ICD-10-CM | POA: Insufficient documentation

## 2012-07-22 NOTE — Progress Notes (Signed)
Ms. Kindall in to nursing for an IV start.  Unable to establish an peripheral IV in her right hand due to lack of blood return. IV access established in her porta-cath in her right upper chest.  Brisk blood return.  Areas dressed.  Awaiting IV simulation.  She was informed that she would revcieve IV contrast and she confirmed that she has received contrast for IV's inthe past and had no untoward reactions.

## 2012-07-23 NOTE — Progress Notes (Signed)
  Radiation Oncology         (336) [phone] ________________________________  Name: [name] MRN: [id number]  Date: 07/22/2012  DOB: 04-09-1955  SIMULATION AND TREATMENT PLANNING NOTE  DIAGNOSIS:  Brain Metastases  NARRATIVE:  The patient was brought to the CT Simulation planning suite.  Identity was confirmed.  All relevant records and images related to the planned course of therapy were reviewed.  The patient freely provided informed written consent to proceed with treatment after reviewing the details related to the planned course of therapy. The consent form was witnessed and verified by the simulation staff. Intravenous access was established for contrast administration. Then, the patient was set-up in a stable reproducible supine position for radiation therapy.  A relocatable thermoplastic stereotactic head frame was fabricated for precise immobilization.  CT images were obtained.  Surface markings were placed.  The CT images were loaded into the planning software and fused with the patient's targeting MRI scan.  Then the target and avoidance structures were contoured.  Treatment planning then occurred.  The radiation prescription was entered and confirmed.  I have requested 3D planning  I have requested a DVH of the following structures: Brain stem, brain, left eye, right ete, lenses, optic chiasm, target volumes, uninvolved brain, and normal tissue.    PLAN:  The patient will receive 20 Gy in 1 fraction with stereotactic radiosurgery to each of the following 1) 5.10mm left temporal lesion  2) 3.8 mm right superior vermian lesion  3) 3.0 mm left occipital lesion  4) 2.7 mm left cerebellar lesion  5) 2.7 mm right anterior temporal lesion  6) 2.1 mm posterior left superior frontal lesion .  -----------------------------------  Lonie Peak, MD

## 2012-07-24 ENCOUNTER — Ambulatory Visit: Payer: BC Managed Care – PPO | Admitting: Hematology & Oncology

## 2012-07-24 ENCOUNTER — Ambulatory Visit (HOSPITAL_BASED_OUTPATIENT_CLINIC_OR_DEPARTMENT_OTHER): Payer: BC Managed Care – PPO

## 2012-07-24 ENCOUNTER — Other Ambulatory Visit (HOSPITAL_BASED_OUTPATIENT_CLINIC_OR_DEPARTMENT_OTHER): Payer: BC Managed Care – PPO | Admitting: Lab

## 2012-07-24 DIAGNOSIS — Z5112 Encounter for antineoplastic immunotherapy: Secondary | ICD-10-CM

## 2012-07-24 DIAGNOSIS — C50419 Malignant neoplasm of upper-outer quadrant of unspecified female breast: Secondary | ICD-10-CM

## 2012-07-24 DIAGNOSIS — C7951 Secondary malignant neoplasm of bone: Secondary | ICD-10-CM

## 2012-07-24 DIAGNOSIS — Z5111 Encounter for antineoplastic chemotherapy: Secondary | ICD-10-CM

## 2012-07-24 DIAGNOSIS — C7952 Secondary malignant neoplasm of bone marrow: Secondary | ICD-10-CM

## 2012-07-24 DIAGNOSIS — C3430 Malignant neoplasm of lower lobe, unspecified bronchus or lung: Secondary | ICD-10-CM

## 2012-07-24 DIAGNOSIS — C3492 Malignant neoplasm of unspecified part of left bronchus or lung: Secondary | ICD-10-CM

## 2012-07-24 DIAGNOSIS — C3490 Malignant neoplasm of unspecified part of unspecified bronchus or lung: Secondary | ICD-10-CM

## 2012-07-24 LAB — CBC WITH DIFFERENTIAL (CANCER CENTER ONLY)
BASO%: 0.6 % (ref 0.0–2.0)
Eosinophils Absolute: 0 10*3/uL (ref 0.0–0.5)
HCT: 36.8 % (ref 34.8–46.6)
LYMPH%: 10.3 % — ABNORMAL LOW (ref 14.0–48.0)
MCV: 86 fL (ref 81–101)
MONO#: 0.6 10*3/uL (ref 0.1–0.9)
NEUT%: 82.3 % — ABNORMAL HIGH (ref 39.6–80.0)
RDW: 16.7 % — ABNORMAL HIGH (ref 11.1–15.7)
WBC: 9 10*3/uL (ref 3.9–10.0)

## 2012-07-24 LAB — CMP (CANCER CENTER ONLY)
BUN, Bld: 11 mg/dL (ref 7–22)
CO2: 24 mEq/L (ref 18–33)
Creat: 0.5 mg/dl — ABNORMAL LOW (ref 0.6–1.2)
Glucose, Bld: 141 mg/dL — ABNORMAL HIGH (ref 73–118)
Total Bilirubin: 0.4 mg/dl (ref 0.20–1.60)

## 2012-07-24 MED ORDER — SODIUM CHLORIDE 0.9 % IJ SOLN
10.0000 mL | INTRAMUSCULAR | Status: DC | PRN
  Administered 2012-07-24: 10 mL
  Filled 2012-07-24: qty 10

## 2012-07-24 MED ORDER — ZOLEDRONIC ACID 4 MG/100ML IV SOLN
4.0000 mg | Freq: Once | INTRAVENOUS | Status: AC
  Administered 2012-07-24: 4 mg via INTRAVENOUS
  Filled 2012-07-24: qty 100

## 2012-07-24 MED ORDER — DEXAMETHASONE SODIUM PHOSPHATE 20 MG/5ML IJ SOLN
20.0000 mg | Freq: Once | INTRAMUSCULAR | Status: AC
  Administered 2012-07-24: 20 mg via INTRAVENOUS
  Filled 2012-07-24: qty 5

## 2012-07-24 MED ORDER — SODIUM CHLORIDE 0.9 % IV SOLN
15.0000 mg/kg | Freq: Once | INTRAVENOUS | Status: AC
  Administered 2012-07-24: 1125 mg via INTRAVENOUS
  Filled 2012-07-24: qty 45

## 2012-07-24 MED ORDER — SODIUM CHLORIDE 0.9 % IV SOLN
580.0000 mg | Freq: Once | INTRAVENOUS | Status: AC
  Administered 2012-07-24: 580 mg via INTRAVENOUS
  Filled 2012-07-24: qty 58

## 2012-07-24 MED ORDER — SODIUM CHLORIDE 0.9 % IV SOLN
Freq: Once | INTRAVENOUS | Status: AC
  Administered 2012-07-24: 09:00:00 via INTRAVENOUS

## 2012-07-24 MED ORDER — SODIUM CHLORIDE 0.9 % IV SOLN: 500.0000 mg | Freq: Once | INTRAVENOUS | Status: DC

## 2012-07-24 MED ORDER — SODIUM CHLORIDE 0.9 % IV SOLN
500.0000 mg/m2 | Freq: Once | INTRAVENOUS | Status: AC
  Administered 2012-07-24: 900 mg via INTRAVENOUS
  Filled 2012-07-24: qty 36

## 2012-07-24 MED ORDER — ONDANSETRON 16 MG/50ML IVPB (CHCC)
16.0000 mg | Freq: Once | INTRAVENOUS | Status: AC
  Administered 2012-07-24: 16 mg via INTRAVENOUS

## 2012-07-24 MED ORDER — HEPARIN SOD (PORK) LOCK FLUSH 100 UNIT/ML IV SOLN
500.0000 [IU] | Freq: Once | INTRAVENOUS | Status: AC | PRN
  Administered 2012-07-24: 500 [IU]
  Filled 2012-07-24: qty 5

## 2012-07-24 NOTE — Patient Instructions (Addendum)
[facility] [address] [place], [place]  [address] Discharge Instructions for Patients Receiving Chemotherapy  Today you received the following chemotherapy agents:  Alimta, Carboplatin, and Avastin.  To help prevent nausea and vomiting after your treatment, we encourage you to take your nausea medications as ordered. If you develop nausea and vomiting that is not controlled by your nausea medication, call the clinic. If it is after clinic hours your family physician or the after hours number for the clinic or go to the Emergency Department.   BELOW ARE SYMPTOMS THAT SHOULD BE REPORTED IMMEDIATELY:  *FEVER GREATER THAN 100.5 F  *CHILLS WITH OR WITHOUT FEVER  NAUSEA AND VOMITING THAT IS NOT CONTROLLED WITH YOUR NAUSEA MEDICATION  *UNUSUAL SHORTNESS OF BREATH  *UNUSUAL BRUISING OR BLEEDING  TENDERNESS IN MOUTH AND THROAT WITH OR WITHOUT PRESENCE OF ULCERS  *URINARY PROBLEMS  *BOWEL PROBLEMS  UNUSUAL RASH Items with * indicate a potential emergency and should be followed up as soon as possible.   Feel free to call the clinic you have any questions or concerns. The clinic phone number is [phone].[facility] Discharge Instructions for Patients Receiving Chemotherapy

## 2012-07-25 NOTE — Progress Notes (Signed)
CC:   [name], M.D.  DIAGNOSES: 1. Metastatic adenocarcinoma of the lung. 2. Invasive lobular carcinoma of the right breast.  CURRENT THERAPY: 1. Patient is status post cycle 1 of carboplatin/Alimta/Avastin. 2. Zometa 4 mg IV q.3 weeks. 3. Aromasin 25 mg p.o. daily.  INTERIM HISTORY:  [name] comes in for followup.  She did well quite well with her 1st cycle of chemotherapy.  She really had little in the way of nausea or vomiting.  Unfortunately, she did have an MRI of the brain which did show 6 new tumors.  These were all subcentimeter.  She is asymptomatic with these. She will be undergoing stereotactic radiosurgery for these, I think next week.  She has had no cough.  She has had no bleeding.  There has been no nausea or vomiting.  There has been some constipation.  She is only taking 1 hydrocodone a day.  She is not on the long-acting hydrocodone (Zohydro).  There has been no leg swelling.  She has had no rashes.  Overall, her performance status is ECOG 1.  PHYSICAL EXAMINATION:  General:  This is a well-developed, well- nourished white female in no obvious distress.  Vital Signs:  Showed temperature of 97.7, pulse 98, respiratory rate 16, blood pressure 151/86.  Weight is 164 pounds.  Head and Neck:  Shows a normocephalic, atraumatic skull.  There are no ocular or oral lesions.  There are no palpable cervical or supraclavicular lymph nodes.  Lungs:  Clear bilaterally.  Cardiac:  Regular rate and rhythm with a normal S1 and S2. There are no murmurs, rubs, or bruits.  Abdomen:  Soft with good bowel sounds.  There is no palpable abdominal mass.  There is no fluid wave. There is no palpable hepatosplenomegaly.  Back:  No tenderness over the spine, ribs, or hips.  Extremities:  Show no clubbing, cyanosis, or edema.  Neurological:  Exam shows no focal neurological deficits.  LABORATORY STUDIES:  White cell count 9, hemoglobin 12.1, hematocrit 37, platelet count  716.  IMPRESSION:  [name] is a very charming 57 year old white female with metastatic adenocarcinoma of the lung.  She is EGFR positive. Unfortunately, she progressed on Tarceva and afatinib.  She will receive her second cycle of chemotherapy today.  Her thrombocytosis is reactive, most likely to the underlying cancer.  As far as her breast cancer goes, this has been relatively stable.  We have not seen any growth with this.  We will go ahead and plan for followup scans after this cycle of chemotherapy.  We will also get an ultrasound of the right breast to assess for the breast cancer.  Clinically, [name] really has done nicely.  Hopefully, we will find that this will correlate radiographically.    ______________________________ Josph Macho, M.D. PRE/MEDQ  D:  07/24/2012  T:  07/25/2012  Job:  [id number]

## 2012-07-26 ENCOUNTER — Encounter: Payer: Self-pay | Admitting: Radiation Oncology

## 2012-07-26 ENCOUNTER — Ambulatory Visit
Admission: RE | Admit: 2012-07-26 | Discharge: 2012-07-26 | Disposition: A | Discharge status: Home / Self Care | Payer: BC Managed Care – PPO | Source: Ambulatory Visit | Attending: Radiation Oncology | Admitting: Radiation Oncology

## 2012-07-26 VITALS — BP 121/81 | HR 80 | Temp 97.5°F

## 2012-07-26 DIAGNOSIS — C7931 Secondary malignant neoplasm of brain: Secondary | ICD-10-CM

## 2012-07-26 NOTE — Progress Notes (Signed)
SRS completed.  No voiced complaints.  Sitting in recliner.  Accompanied by her husband.   She has been observed for 1 hour and denies any headache, vision changes, nor nausea.

## 2012-07-26 NOTE — Progress Notes (Signed)
  Radiation Oncology         [phone]) [phone] ________________________________  Stereotactic Treatment Procedure Note  Name: [name] MRN: [id number]  Date: 07/26/2012  DOB: 08-08-1955  SPECIAL TREATMENT PROCEDURE  3D TREATMENT PLANNING AND DOSIMETRY:  The patient's radiation plan was reviewed and approved by neurosurgery and radiation oncology prior to treatment.  It showed 3-dimensional radiation distributions overlaid onto the planning CT/MRI image set.  The [facility] for the target structures as well as the organs at risk were reviewed. The documentation of the 3D plan and dosimetry are filed in the radiation oncology EMR.  NARRATIVE:  [name] was brought to the TrueBeam stereotactic radiation treatment machine and placed supine on the CT couch. The head frame was applied, and the patient was set up for stereotactic radiosurgery.  Neurosurgery was present for the set-up and delivery  SIMULATION VERIFICATION:  In the couch zero-angle position, the patient underwent Exactrac imaging using the Brainlab system with orthogonal KV images.  These were carefully aligned and repeated to confirm treatment position for each of the isocenters.  The Exactrac snap film verification was repeated at each couch angle.  SPECIAL TREATMENT PROCEDURE: Legrand Como received stereotactic radiosurgery to the following targets:  Left temporal 5mm target was treated using 3 Circular Arcs to a prescription dose of 20 Gy.  ExacTrac Snap verification was performed for each couch angle.  Left cerebellar 3mm target was treated using 2 Circular Arcs to a prescription dose of 20 Gy.  ExacTrac Snap verification was performed for each couch angle.  Left posterior superior frontal 2mm target was treated using 3 Circular Arcs to a prescription dose of 20 Gy.  ExacTrac Snap verification was performed for each couch angle.  Right anterior temporal 3mm  target was treated using 3 Circular Arcs to a  prescription dose of 20 Gy.  ExacTrac Snap verification was performed for each couch angle.  Right superior vermian 4mm target was treated using 3 Circular Arcs to a prescription dose of 20 Gy.  ExacTrac Snap verification was performed for each couch angle.  Left occipital 3mm target was treated using 3 Circular Arcs to a prescription dose of 20 Gy.  ExacTrac Snap verification was performed for each couch angle.   This constitutes a special treatment procedure due to the ablative dose delivered and the technical nature of treatment.  This highly technical modality of treatment ensures that the ablative dose is centered on the patient's tumor while sparing normal tissues from excessive dose and risk of detrimental effects.   STEREOTACTIC TREATMENT MANAGEMENT:  Following delivery, the patient was transported to nursing in stable condition and monitored for possible acute effects.  Vital signs were recorded BP 121/81  Pulse 80  Temp(Src) 97.5 F (36.4 C). The patient tolerated treatment without significant acute effects, and was discharged to home in stable condition.    PLAN: Follow-up in one month.  ________________________________   Lonie Peak, MD

## 2012-07-29 ENCOUNTER — Ambulatory Visit: Payer: BC Managed Care – PPO | Admitting: Radiation Oncology

## 2012-07-30 NOTE — Progress Notes (Signed)
  Radiation Oncology         [phone]) [phone] ________________________________  Name: [name] MRN: [id number]  Date: 07/26/2012  DOB: 1955/10/20  End of Treatment Note  Diagnosis:  Adenocarcinoma of the lung, with brain metastases      Indication for treatment:  Palliative/ local control    Radiation treatment dates:   07/26/2012  Site/dose/Beams/energy:  She was treated with stereotactic radiosurgery, all beams were 6 FFF MV photons   Left temporal 5mm target was treated using 3 Circular Arcs to a prescription dose of 20 Gy. ExacTrac Snap verification was performed for each couch angle.    Left cerebellar 3mm target was treated using 2 Circular Arcs to a prescription dose of 20 Gy. ExacTrac Snap verification was performed for each couch angle.   Left posterior superior frontal 2mm target was treated using 3 Circular Arcs to a prescription dose of 20 Gy. ExacTrac Snap verification was performed for each couch angle.   Right anterior temporal 3mm target was treated using 3 Circular Arcs to a prescription dose of 20 Gy. ExacTrac Snap verification was performed for each couch angle.   Right superior vermian 4mm target was treated using 3 Circular Arcs to a prescription dose of 20 Gy. ExacTrac Snap verification was performed for each couch angle.   Left occipital 3mm target was treated using 3 Circular Arcs to a prescription dose of 20 Gy. ExacTrac Snap verification was performed for each couch angle.  Narrative: The patient tolerated radiation treatment relatively well with no acute complications.  Plan: The patient has completed radiation treatment. The patient will return to radiation oncology clinic for routine followup in one month. I advised them to call or return sooner if they have any questions or concerns related to their recovery or treatment.  -----------------------------------  Lonie Peak, MD

## 2012-08-13 ENCOUNTER — Other Ambulatory Visit: Payer: Self-pay | Admitting: *Deleted

## 2012-08-13 DIAGNOSIS — C7931 Secondary malignant neoplasm of brain: Secondary | ICD-10-CM

## 2012-08-13 DIAGNOSIS — C7951 Secondary malignant neoplasm of bone: Secondary | ICD-10-CM

## 2012-08-14 ENCOUNTER — Ambulatory Visit (HOSPITAL_BASED_OUTPATIENT_CLINIC_OR_DEPARTMENT_OTHER): Payer: BC Managed Care – PPO

## 2012-08-14 ENCOUNTER — Ambulatory Visit (HOSPITAL_BASED_OUTPATIENT_CLINIC_OR_DEPARTMENT_OTHER): Payer: BC Managed Care – PPO | Admitting: Hematology & Oncology

## 2012-08-14 ENCOUNTER — Other Ambulatory Visit: Payer: Self-pay | Admitting: *Deleted

## 2012-08-14 ENCOUNTER — Other Ambulatory Visit (HOSPITAL_BASED_OUTPATIENT_CLINIC_OR_DEPARTMENT_OTHER): Payer: BC Managed Care – PPO | Admitting: Lab

## 2012-08-14 VITALS — BP 134/82 | HR 82 | Temp 97.6°F | Resp 16 | Ht 62.0 in | Wt 165.0 lb

## 2012-08-14 DIAGNOSIS — C50419 Malignant neoplasm of upper-outer quadrant of unspecified female breast: Secondary | ICD-10-CM

## 2012-08-14 DIAGNOSIS — C3430 Malignant neoplasm of lower lobe, unspecified bronchus or lung: Secondary | ICD-10-CM

## 2012-08-14 DIAGNOSIS — R5383 Other fatigue: Secondary | ICD-10-CM

## 2012-08-14 DIAGNOSIS — R5381 Other malaise: Secondary | ICD-10-CM

## 2012-08-14 DIAGNOSIS — Z5111 Encounter for antineoplastic chemotherapy: Secondary | ICD-10-CM

## 2012-08-14 DIAGNOSIS — C7951 Secondary malignant neoplasm of bone: Secondary | ICD-10-CM

## 2012-08-14 DIAGNOSIS — C3491 Malignant neoplasm of unspecified part of right bronchus or lung: Secondary | ICD-10-CM

## 2012-08-14 DIAGNOSIS — Z5112 Encounter for antineoplastic immunotherapy: Secondary | ICD-10-CM

## 2012-08-14 DIAGNOSIS — C7931 Secondary malignant neoplasm of brain: Secondary | ICD-10-CM

## 2012-08-14 LAB — COMPREHENSIVE METABOLIC PANEL
CO2: 25 mEq/L (ref 19–32)
Calcium: 9 mg/dL (ref 8.4–10.5)
Creatinine, Ser: 0.71 mg/dL (ref 0.50–1.10)
Glucose, Bld: 111 mg/dL — ABNORMAL HIGH (ref 70–99)
Total Bilirubin: 0.2 mg/dL — ABNORMAL LOW (ref 0.3–1.2)
Total Protein: 6.3 g/dL (ref 6.0–8.3)

## 2012-08-14 LAB — CBC WITH DIFFERENTIAL (CANCER CENTER ONLY)
Eosinophils Absolute: 0.1 10*3/uL (ref 0.0–0.5)
HCT: 35.2 % (ref 34.8–46.6)
HGB: 11.5 g/dL — ABNORMAL LOW (ref 11.6–15.9)
LYMPH%: 19.3 % (ref 14.0–48.0)
MCV: 89 fL (ref 81–101)
MONO#: 0.5 10*3/uL (ref 0.1–0.9)
NEUT%: 71.6 % (ref 39.6–80.0)
RBC: 3.94 10*6/uL (ref 3.70–5.32)
WBC: 7.4 10*3/uL (ref 3.9–10.0)

## 2012-08-14 MED ORDER — METHYLPHENIDATE HCL 5 MG PO TABS: 5.0000 mg | ORAL_TABLET | Freq: Two times a day (BID) | ORAL | Status: DC

## 2012-08-14 MED ORDER — ZOLPIDEM TARTRATE 10 MG PO TABS: 10.0000 mg | ORAL_TABLET | Freq: Every evening | ORAL | Status: AC | PRN

## 2012-08-14 MED ORDER — SODIUM CHLORIDE 0.9 % IV SOLN
15.0000 mg/kg | Freq: Once | INTRAVENOUS | Status: AC
  Administered 2012-08-14: 1125 mg via INTRAVENOUS
  Filled 2012-08-14: qty 45

## 2012-08-14 MED ORDER — SODIUM CHLORIDE 0.9 % IV SOLN
600.0000 mg | Freq: Once | INTRAVENOUS | Status: AC
  Administered 2012-08-14: 600 mg via INTRAVENOUS
  Filled 2012-08-14: qty 60

## 2012-08-14 MED ORDER — SODIUM CHLORIDE 0.9 % IV SOLN
500.0000 mg/m2 | Freq: Once | INTRAVENOUS | Status: AC
  Administered 2012-08-14: 900 mg via INTRAVENOUS
  Filled 2012-08-14: qty 36

## 2012-08-14 MED ORDER — SODIUM CHLORIDE 0.9 % IJ SOLN
10.0000 mL | INTRAMUSCULAR | Status: DC | PRN
  Administered 2012-08-14: 10 mL
  Filled 2012-08-14: qty 10

## 2012-08-14 MED ORDER — CYANOCOBALAMIN 1000 MCG/ML IJ SOLN
1000.0000 ug | INTRAMUSCULAR | Status: DC
  Administered 2012-08-14: 1000 ug via INTRAMUSCULAR

## 2012-08-14 MED ORDER — FAMOTIDINE IN NACL 20-0.9 MG/50ML-% IV SOLN
40.0000 mg | Freq: Once | INTRAVENOUS | Status: AC
  Administered 2012-08-14: 40 mg via INTRAVENOUS

## 2012-08-14 MED ORDER — ONDANSETRON 16 MG/50ML IVPB (CHCC)
16.0000 mg | Freq: Once | INTRAVENOUS | Status: AC
  Administered 2012-08-14: 16 mg via INTRAVENOUS

## 2012-08-14 MED ORDER — SODIUM CHLORIDE 0.9 % IV SOLN
Freq: Once | INTRAVENOUS | Status: AC
  Administered 2012-08-14: 10:00:00 via INTRAVENOUS

## 2012-08-14 MED ORDER — PROCHLORPERAZINE MALEATE 10 MG PO TABS: 10.0000 mg | ORAL_TABLET | ORAL | Status: DC | PRN

## 2012-08-14 MED ORDER — ZOLEDRONIC ACID 4 MG/100ML IV SOLN
4.0000 mg | Freq: Once | INTRAVENOUS | Status: AC
  Administered 2012-08-14: 4 mg via INTRAVENOUS
  Filled 2012-08-14: qty 100

## 2012-08-14 MED ORDER — HEPARIN SOD (PORK) LOCK FLUSH 100 UNIT/ML IV SOLN
500.0000 [IU] | Freq: Once | INTRAVENOUS | Status: AC | PRN
  Administered 2012-08-14: 500 [IU]
  Filled 2012-08-14: qty 5

## 2012-08-14 MED ORDER — DEXAMETHASONE SODIUM PHOSPHATE 20 MG/5ML IJ SOLN
20.0000 mg | Freq: Once | INTRAMUSCULAR | Status: AC
  Administered 2012-08-14: 20 mg via INTRAVENOUS
  Filled 2012-08-14: qty 5

## 2012-08-14 NOTE — Patient Instructions (Addendum)
    [facility] Discharge Instructions for Patients Receiving Chemotherapy  Today you received the following chemotherapy agents Avastin, Carboplatin, Alimta, Zometa  To help prevent nausea and vomiting after your treatment, we encourage you to take your nausea medication    If you develop nausea and vomiting that is not controlled by your nausea medication, call the clinic.   BELOW ARE SYMPTOMS THAT SHOULD BE REPORTED IMMEDIATELY:  *FEVER GREATER THAN 100.5 F  *CHILLS WITH OR WITHOUT FEVER  NAUSEA AND VOMITING THAT IS NOT CONTROLLED WITH YOUR NAUSEA MEDICATION  *UNUSUAL SHORTNESS OF BREATH  *UNUSUAL BRUISING OR BLEEDING  TENDERNESS IN MOUTH AND THROAT WITH OR WITHOUT PRESENCE OF ULCERS  *URINARY PROBLEMS  *BOWEL PROBLEMS  UNUSUAL RASH Items with * indicate a potential emergency and should be followed up as soon as possible.  Feel free to call the clinic you have any questions or concerns. The clinic phone number is [phone].

## 2012-08-14 NOTE — Addendum Note (Signed)
Addended by: Arlan Organ R on: 08/14/2012 01:09 PM   Modules accepted: Orders

## 2012-08-14 NOTE — Progress Notes (Signed)
This office note has been dictated.

## 2012-08-15 NOTE — Progress Notes (Signed)
CC:   [name], M.D.  DIAGNOSES: 1. Metastatic adenocarcinoma of the lung. 2. Invasive lobular carcinoma of the right breast.  CURRENT THERAPY: 1. Status post 2 cycles of carboplatin/Alimta/Avastin. 2. Zometa 4 mg IV q.3 weeks. 3. Aromasin 25 mg p.o. daily.  INTERIM HISTORY:  [name] comes in for her followup.  She has done quite well.  Fatigue is the biggest problem for her.  I talked about Ritalin for her.  I think she will think about this.  Pain has been doing pretty well.  She is on __________ now.  This has helped quite a bit. She has had no cough.  There has been no bleeding.  She has had no leg swelling.  She has had no headache.  She has been seen by Dr. Basilio Cairo of radiation oncology.  Dr. Basilio Cairo has done stereotactic radiosurgery on her.  I think the last treatment was back on 07/11.  I think she had 6 lesions that were treated.  [name] is still working.  She enjoys work.  It seems to make her feel better.  PHYSICAL EXAMINATION:  General:  This is a well-developed, well- nourished white female in no obvious distress.  Vital signs: Temperature of 97.6, pulse 82, respiratory rate 16, blood pressure 134/82.  Weight is 165.  Head and neck:  Normocephalic, atraumatic skull.  There are no ocular or oral lesions.  There are no palpable cervical or supraclavicular lymph nodes.  Lungs:  Clear bilaterally. Cardiac:  Regular rate and rhythm with a normal S1, S2.  There are no murmurs, rubs or bruits.  Abdomen:  Soft.  She has good bowel sounds. There is no palpable abdominal mass.  There is no fluid wave.  There is no palpable hepatosplenomegaly.  Extremities:  Show no clubbing, cyanosis or edema.  Neurological:  Shows no focal neurological deficits.  LABORATORY STUDIES:  White cell count 7.4, hemoglobin 11.5, hematocrit 35.2, platelet count 482.  IMPRESSION:  [name] is a nice 57 year old white female with metastatic lung cancer.  Of note, she is  positive for the EGFR receptor. Unfortunately, she did not respond to Tarceva or afatinib.  As such, we have her on systemic chemotherapy now. This is her 3rd cycle of chemo. After this we will repeat her CT scans to see how things are going.  Clinically, she is doing well, so hopefully we will see a response.  If we see a response, then we will go ahead and plan for a total of 6 cycles of treatment and then probably maintenance therapy.    ______________________________ Josph Macho, M.D. PRE/MEDQ  D:  08/14/2012  T:  08/15/2012  Job:  [id number]

## 2012-08-26 ENCOUNTER — Ambulatory Visit: Payer: BC Managed Care – PPO | Admitting: Radiation Oncology

## 2012-08-29 ENCOUNTER — Ambulatory Visit (HOSPITAL_BASED_OUTPATIENT_CLINIC_OR_DEPARTMENT_OTHER)
Admission: RE | Admit: 2012-08-29 | Discharge: 2012-08-29 | Disposition: A | Discharge status: Home / Self Care | Payer: BC Managed Care – PPO | Source: Ambulatory Visit | Attending: Hematology & Oncology | Admitting: Hematology & Oncology

## 2012-08-29 DIAGNOSIS — Z79899 Other long term (current) drug therapy: Secondary | ICD-10-CM | POA: Insufficient documentation

## 2012-08-29 DIAGNOSIS — C787 Secondary malignant neoplasm of liver and intrahepatic bile duct: Secondary | ICD-10-CM | POA: Insufficient documentation

## 2012-08-29 DIAGNOSIS — C3491 Malignant neoplasm of unspecified part of right bronchus or lung: Secondary | ICD-10-CM

## 2012-08-29 DIAGNOSIS — C7800 Secondary malignant neoplasm of unspecified lung: Secondary | ICD-10-CM | POA: Insufficient documentation

## 2012-08-29 DIAGNOSIS — C7951 Secondary malignant neoplasm of bone: Secondary | ICD-10-CM | POA: Insufficient documentation

## 2012-08-29 DIAGNOSIS — C3490 Malignant neoplasm of unspecified part of unspecified bronchus or lung: Secondary | ICD-10-CM | POA: Insufficient documentation

## 2012-08-29 DIAGNOSIS — R5383 Other fatigue: Secondary | ICD-10-CM

## 2012-08-29 DIAGNOSIS — R599 Enlarged lymph nodes, unspecified: Secondary | ICD-10-CM | POA: Insufficient documentation

## 2012-08-29 MED ORDER — IOHEXOL 300 MG/ML  SOLN
100.0000 mL | Freq: Once | INTRAMUSCULAR | Status: AC | PRN
  Administered 2012-08-29: 100 mL via INTRAVENOUS

## 2012-08-30 ENCOUNTER — Other Ambulatory Visit (HOSPITAL_BASED_OUTPATIENT_CLINIC_OR_DEPARTMENT_OTHER): Payer: BC Managed Care – PPO

## 2012-08-30 ENCOUNTER — Ambulatory Visit: Payer: BC Managed Care – PPO | Admitting: Radiation Oncology

## 2012-09-02 ENCOUNTER — Encounter: Payer: Self-pay | Admitting: Radiation Oncology

## 2012-09-03 ENCOUNTER — Encounter: Payer: Self-pay | Admitting: Radiation Oncology

## 2012-09-03 ENCOUNTER — Ambulatory Visit
Admission: RE | Admit: 2012-09-03 | Discharge: 2012-09-03 | Disposition: A | Discharge status: Home / Self Care | Payer: BC Managed Care – PPO | Source: Ambulatory Visit | Attending: Radiation Oncology | Admitting: Radiation Oncology

## 2012-09-03 VITALS — BP 139/98 | HR 93 | Temp 98.2°F | Ht 62.0 in | Wt 169.5 lb

## 2012-09-03 DIAGNOSIS — C7931 Secondary malignant neoplasm of brain: Secondary | ICD-10-CM

## 2012-09-03 HISTORY — DX: Personal history of antineoplastic chemotherapy: Z92.21

## 2012-09-03 NOTE — Progress Notes (Signed)
[name] here today for assessment following radiation to her brain.  She denies any  ataxia, blurred vision, headaches nor difficulty with fine motor skills.  She continues to have tearing of her eyes.  She reports pain in the mid back region.

## 2012-09-03 NOTE — Progress Notes (Signed)
Radiation Oncology         [phone] ________________________________  Name: [name] MRN: [id number]  Date: 09/03/2012  DOB: 11-Jan-1956  Follow-Up Visit Note  Outpatient  CC: [name], MD  [name], MD  Diagnosis and Prior Radiotherapy:    Adenocarcinoma of the lung, with brain metastases   She completed stereotactic radiosurgery to the Left temporal 5mm target, Left cerebellar 3mm target , Left posterior superior frontal 2mm target, Right anterior temporal 3mm target , Right superior vermian 4mm target , Left occipital 3mm target, all to 20 Gy in 07-26-12  She completed stereotactic radiosurgery to the right frontal, right thalamic, left frontal and left occipital target, 20 Gray in 1 fraction to each target, on 07/19/11.  07/05/2011-07/21/2011 - LS spine and thoracic spine (T7-T9) 30 Gy in 10 sessions   Narrative:  The patient returns today for routine follow-up.  She denies any ataxia, blurred vision, headaches nor difficulty with fine motor skills.  No focal weakness or numbness. She continues to have tearing of her eyes. She reports pain in the mid back region - stable.  CT scans in the past week showed good response to current chemo regimen.  Receiving carboplatin/Alimta/Avastin, Zometa, and Aromasin.  Feels quite well overall.                            ALLERGIES:  has No Known Allergies.  Meds: Current Outpatient Prescriptions  Medication Sig Dispense Refill  . ALPRAZolam (XANAX) 0.25 MG tablet Take 0.25-0.5 mg by mouth every 8 (eight) hours as needed. For anxiety.      Marland Kitchen dexamethasone (DECADRON) 4 MG tablet Take 4 mg by mouth. As directed with Chemo      . DiphenhydrAMINE HCl (ZZZQUIL) 50 MG/30ML LIQD Take by mouth at bedtime as needed.      Marland Kitchen exemestane (AROMASIN) 25 MG tablet Take 1 tablet (25 mg total) by mouth daily after breakfast.  30 tablet  6  . folic acid (FOLVITE) 1 MG tablet Take 1 mg by mouth daily.      Marland Kitchen HYDROcodone-acetaminophen  (NORCO) 10-325 MG per tablet Take 1 tablet by mouth every 6 (six) hours as needed for pain.      Marland Kitchen ibuprofen (ADVIL,MOTRIN) 200 MG tablet Take 600 mg by mouth every 6 (six) hours as needed for pain.       Marland Kitchen lidocaine-prilocaine (EMLA) cream Apply topically as needed.       . loperamide (IMODIUM) 2 MG capsule Take 2 mg by mouth 4 (four) times daily as needed. For diarrhea      . loratadine (CLARITIN) 10 MG tablet Take 10 mg by mouth daily.      Marland Kitchen LORazepam (ATIVAN) 0.5 MG tablet Take 1 tablet (0.5 mg total) by mouth every 6 (six) hours as needed (Nausea or vomiting).  30 tablet  0  . ondansetron (ZOFRAN) 8 MG tablet Take 8 mg by mouth every 12 (twelve) hours as needed.       . Probiotic Product (ALIGN) 4 MG CAPS Take by mouth every morning.      . prochlorperazine (COMPAZINE) 10 MG tablet Take 1 tablet (10 mg total) by mouth as needed.  30 tablet  3  . promethazine (PHENERGAN) 12.5 MG tablet Take 12.5 mg by mouth as needed. for nausea      . Skin Protectants, Misc. (EUCERIN) cream Apply 1 application topically every morning.      . triamterene-hydrochlorothiazide (  MAXZIDE-25) 37.5-25 MG per tablet Take 1 tablet by mouth every morning.      Marland Kitchen ZOHYDRO ER 20 MG CP12 Take by mouth. Daily or bid ??      . zolendronic acid (ZOMETA) 4 MG/5ML injection Inject 4 mg into the vein every 30 (thirty) days.      Marland Kitchen zolpidem (AMBIEN) 10 MG tablet Take 1 tablet (10 mg total) by mouth at bedtime as needed.  30 tablet  3  . methylphenidate (RITALIN) 5 MG tablet Take 1 tablet (5 mg total) by mouth 2 (two) times daily.  60 tablet  0   No current facility-administered medications for this encounter.    Physical Findings: The patient is in no acute distress. Patient is alert and oriented.  height is 5\' 2"  (1.575 m) and weight is 169 lb 8 oz (76.885 kg). Her temperature is 98.2 F (36.8 C). Her blood pressure is 139/98 and her pulse is 93. .   General: Alert and oriented, in no acute distress HEENT: Head is  normocephalic. Pupils are equally round and reactive to light. Extraocular movements are intact. Oropharynx is clear. Neck: Neck is supple, no palpable cervical or supraclavicular lymphadenopathy. Heart: Regular in rate and rhythm with no murmurs, rubs, or gallops. Chest: Clear to auscultation bilaterally, with no rhonchi, wheezes, or rales. Musculoskeletal: symmetric strength and muscle tone throughout. Neurologic: Cranial nerves II through XII are grossly intact. No obvious focalities. Speech is fluent. Rapidly alternating movements intact. Coordination is intact. Psychiatric: Judgment and insight are intact. Affect is appropriate.  Lab Findings: Lab Results  Component Value Date   WBC 7.4 08/14/2012   HGB 11.5* 08/14/2012   HCT 35.2 08/14/2012   MCV 89 08/14/2012   PLT 482* 08/14/2012    Radiographic Findings: Ct Chest W Contrast  08/29/2012   *RADIOLOGY REPORT*  Clinical Data:  Follow-up lung carcinoma.  Ongoing chemotherapy.  CT CHEST, ABDOMEN AND PELVIS WITH CONTRAST  Technique:  Multidetector CT imaging of the chest, abdomen and pelvis was performed following the standard protocol during bolus administration of intravenous contrast.  Contrast: OMNIPAQUE IOHEXOL 300 MG/ML  SOLN,  Comparison:  06/14/2012  CT CHEST  Findings:  No dominant mass in the posterior left lower lobe has decreased in size since previous study, currently measuring 3.7 x 4.8 cm on image 40 compared to 4.7 x 6.4 cm previously.  Multiple other smaller pulmonary nodules seen in both lungs are either decreased or stable in size.  No new or enlarging pulmonary nodules or masses are identified.  No evidence of acute infiltrate.  No evidence of pleural or pericardial effusion.  No evidence of hilar lymphadenopathy. Mediastinal lymphadenopathy has decreased since previous exam, with right paratracheal lymph node now measuring 6 mm on image 19 compared to 9 mm previously, and AP window lymph node now measuring 9 mm on image 18  compared to 12 mm previously.  No new areas of lymphadenopathy are seen.  Some chronic bone metastases within the thorax show no significant change.  An asymmetric nodular density in the central left breast measuring 2.4 cm on image 30 is also stable.  IMPRESSION:  1.  Interval decrease in the size of the dominant left lower lobe mass. 2.  Mild improvement in diffuse bilateral pulmonary metastases. 3.  Decreased mediastinal lymphadenopathy. 4. Stable sclerotic bone metastases. 5.  No new or progressive disease within the thorax.  CT ABDOMEN AND PELVIS  Findings:  Liver metastasis in the posterior segment the right hepatic lobe  has decreased in size, currently measuring 1.1 cm on image 48 compared to 2.3 cm previously.  No new or enlarging liver lesions are identified.  Both adrenal glands are normal in appearance.  The pancreas, spleen, and kidneys are also normal in appearance.  No evidence of hydronephrosis.  No other soft tissue masses or pathologically enlarged lymph nodes are seen within the abdomen or pelvis.  A small less than 1 cm left para-aortic lymph nodes are stable.  Uterus and adnexa are unremarkable in appearance.  No evidence of inflammatory process or abnormal fluid collections.  No evidence of bowel wall thickening or obstruction.  Large colonic stool burden noted.  A sclerotic bone metastases involving the lumbar spine, pelvis, and left hip are stable in appearance.  IMPRESSION:  1.  Decreased size of right hepatic lobe metastasis. 2.  Stable sclerotic bone metastases. 3.  No new or progressive disease within the abdomen or pelvis.   Original Report Authenticated By: Myles Rosenthal, M.D.   Ct Abdomen Pelvis W Contrast  08/29/2012   *RADIOLOGY REPORT*  Clinical Data:  Follow-up lung carcinoma.  Ongoing chemotherapy.  CT CHEST, ABDOMEN AND PELVIS WITH CONTRAST  Technique:  Multidetector CT imaging of the chest, abdomen and pelvis was performed following the standard protocol during bolus  administration of intravenous contrast.  Contrast: OMNIPAQUE IOHEXOL 300 MG/ML  SOLN,  Comparison:  06/14/2012  CT CHEST  Findings:  No dominant mass in the posterior left lower lobe has decreased in size since previous study, currently measuring 3.7 x 4.8 cm on image 40 compared to 4.7 x 6.4 cm previously.  Multiple other smaller pulmonary nodules seen in both lungs are either decreased or stable in size.  No new or enlarging pulmonary nodules or masses are identified.  No evidence of acute infiltrate.  No evidence of pleural or pericardial effusion.  No evidence of hilar lymphadenopathy. Mediastinal lymphadenopathy has decreased since previous exam, with right paratracheal lymph node now measuring 6 mm on image 19 compared to 9 mm previously, and AP window lymph node now measuring 9 mm on image 18 compared to 12 mm previously.  No new areas of lymphadenopathy are seen.  Some chronic bone metastases within the thorax show no significant change.  An asymmetric nodular density in the central left breast measuring 2.4 cm on image 30 is also stable.  IMPRESSION:  1.  Interval decrease in the size of the dominant left lower lobe mass. 2.  Mild improvement in diffuse bilateral pulmonary metastases. 3.  Decreased mediastinal lymphadenopathy. 4. Stable sclerotic bone metastases. 5.  No new or progressive disease within the thorax.  CT ABDOMEN AND PELVIS  Findings:  Liver metastasis in the posterior segment the right hepatic lobe has decreased in size, currently measuring 1.1 cm on image 48 compared to 2.3 cm previously.  No new or enlarging liver lesions are identified.  Both adrenal glands are normal in appearance.  The pancreas, spleen, and kidneys are also normal in appearance.  No evidence of hydronephrosis.  No other soft tissue masses or pathologically enlarged lymph nodes are seen within the abdomen or pelvis.  A small less than 1 cm left para-aortic lymph nodes are stable.  Uterus and adnexa are unremarkable  in appearance.  No evidence of inflammatory process or abnormal fluid collections.  No evidence of bowel wall thickening or obstruction.  Large colonic stool burden noted.  A sclerotic bone metastases involving the lumbar spine, pelvis, and left hip are stable in appearance.  IMPRESSION:  1.  Decreased size of right hepatic lobe metastasis. 2.  Stable sclerotic bone metastases. 3.  No new or progressive disease within the abdomen or pelvis.   Original Report Authenticated By: Myles Rosenthal, M.D.    Impression/Plan:   Doing well clinically.  Will obtain MRI in 2 months, with follow-up at that time. She knows to call if issues arise before then.  I spent 20 minutes face to face with the patient and more than 50% of that time was spent in counseling and/or coordination of care. _____________________________________   Lonie Peak, MD

## 2012-09-04 ENCOUNTER — Ambulatory Visit (HOSPITAL_BASED_OUTPATIENT_CLINIC_OR_DEPARTMENT_OTHER): Payer: BC Managed Care – PPO | Admitting: Hematology & Oncology

## 2012-09-04 ENCOUNTER — Other Ambulatory Visit (HOSPITAL_BASED_OUTPATIENT_CLINIC_OR_DEPARTMENT_OTHER): Payer: BC Managed Care – PPO | Admitting: Lab

## 2012-09-04 ENCOUNTER — Ambulatory Visit (HOSPITAL_BASED_OUTPATIENT_CLINIC_OR_DEPARTMENT_OTHER): Payer: BC Managed Care – PPO

## 2012-09-04 VITALS — BP 146/84 | HR 84 | Temp 98.0°F | Resp 16 | Ht 62.0 in | Wt 167.0 lb

## 2012-09-04 DIAGNOSIS — C50419 Malignant neoplasm of upper-outer quadrant of unspecified female breast: Secondary | ICD-10-CM

## 2012-09-04 DIAGNOSIS — C7951 Secondary malignant neoplasm of bone: Secondary | ICD-10-CM

## 2012-09-04 DIAGNOSIS — Z5111 Encounter for antineoplastic chemotherapy: Secondary | ICD-10-CM

## 2012-09-04 DIAGNOSIS — C50911 Malignant neoplasm of unspecified site of right female breast: Secondary | ICD-10-CM

## 2012-09-04 DIAGNOSIS — C3492 Malignant neoplasm of unspecified part of left bronchus or lung: Secondary | ICD-10-CM

## 2012-09-04 DIAGNOSIS — C3430 Malignant neoplasm of lower lobe, unspecified bronchus or lung: Secondary | ICD-10-CM

## 2012-09-04 DIAGNOSIS — C3491 Malignant neoplasm of unspecified part of right bronchus or lung: Secondary | ICD-10-CM

## 2012-09-04 DIAGNOSIS — C787 Secondary malignant neoplasm of liver and intrahepatic bile duct: Secondary | ICD-10-CM

## 2012-09-04 DIAGNOSIS — Z5112 Encounter for antineoplastic immunotherapy: Secondary | ICD-10-CM

## 2012-09-04 DIAGNOSIS — R5383 Other fatigue: Secondary | ICD-10-CM

## 2012-09-04 LAB — CBC WITH DIFFERENTIAL (CANCER CENTER ONLY)
BASO#: 0.1 10*3/uL (ref 0.0–0.2)
Eosinophils Absolute: 0 10*3/uL (ref 0.0–0.5)
HCT: 33.5 % — ABNORMAL LOW (ref 34.8–46.6)
HGB: 10.8 g/dL — ABNORMAL LOW (ref 11.6–15.9)
LYMPH#: 1.3 10*3/uL (ref 0.9–3.3)
MCH: 30.3 pg (ref 26.0–34.0)
NEUT#: 4 10*3/uL (ref 1.5–6.5)
NEUT%: 63.4 % (ref 39.6–80.0)
RBC: 3.57 10*6/uL — ABNORMAL LOW (ref 3.70–5.32)

## 2012-09-04 LAB — CMP (CANCER CENTER ONLY)
Albumin: 3.3 g/dL (ref 3.3–5.5)
BUN, Bld: 8 mg/dL (ref 7–22)
CO2: 29 mEq/L (ref 18–33)
Calcium: 9.2 mg/dL (ref 8.0–10.3)
Chloride: 99 mEq/L (ref 98–108)
Glucose, Bld: 102 mg/dL (ref 73–118)
Potassium: 4 mEq/L (ref 3.3–4.7)
Sodium: 141 mEq/L (ref 128–145)
Total Protein: 7.2 g/dL (ref 6.4–8.1)

## 2012-09-04 LAB — UA PROTEIN, DIPSTICK - CHCC SATELLITE: Protein, Urine: NEGATIVE mg/dL

## 2012-09-04 MED ORDER — SODIUM CHLORIDE 0.9 % IV SOLN
15.0000 mg/kg | Freq: Once | INTRAVENOUS | Status: AC
  Administered 2012-09-04: 1125 mg via INTRAVENOUS
  Filled 2012-09-04: qty 45

## 2012-09-04 MED ORDER — EXEMESTANE 25 MG PO TABS: 25.0000 mg | ORAL_TABLET | Freq: Every day | ORAL | Status: DC

## 2012-09-04 MED ORDER — SODIUM CHLORIDE 0.9 % IJ SOLN
10.0000 mL | INTRAMUSCULAR | Status: AC | PRN
  Administered 2012-09-04: 10 mL
  Filled 2012-09-04: qty 10

## 2012-09-04 MED ORDER — ZOLEDRONIC ACID 4 MG/100ML IV SOLN
4.0000 mg | Freq: Once | INTRAVENOUS | Status: AC
  Administered 2012-09-04: 4 mg via INTRAVENOUS
  Filled 2012-09-04: qty 100

## 2012-09-04 MED ORDER — ONDANSETRON HCL 8 MG PO TABS: 8.0000 mg | ORAL_TABLET | Freq: Two times a day (BID) | ORAL | Status: DC | PRN

## 2012-09-04 MED ORDER — DEXAMETHASONE SODIUM PHOSPHATE 20 MG/5ML IJ SOLN
20.0000 mg | Freq: Once | INTRAMUSCULAR | Status: AC
  Administered 2012-09-04: 20 mg via INTRAVENOUS
  Filled 2012-09-04: qty 5

## 2012-09-04 MED ORDER — DEXAMETHASONE 4 MG PO TABS: 4.0000 mg | ORAL_TABLET | Freq: Two times a day (BID) | ORAL | Status: DC

## 2012-09-04 MED ORDER — ONDANSETRON 16 MG/50ML IVPB (CHCC)
16.0000 mg | Freq: Once | INTRAVENOUS | Status: AC
Start: 2012-09-04 — End: 2012-09-04
  Administered 2012-09-04: 16 mg via INTRAVENOUS

## 2012-09-04 MED ORDER — SODIUM CHLORIDE 0.9 % IV SOLN
500.0000 mg/m2 | Freq: Once | INTRAVENOUS | Status: AC
  Administered 2012-09-04: 900 mg via INTRAVENOUS
  Filled 2012-09-04: qty 36

## 2012-09-04 MED ORDER — SODIUM CHLORIDE 0.9 % IV SOLN
Freq: Once | INTRAVENOUS | Status: AC
  Administered 2012-09-04: 12:00:00 via INTRAVENOUS

## 2012-09-04 MED ORDER — HEPARIN SOD (PORK) LOCK FLUSH 100 UNIT/ML IV SOLN
500.0000 [IU] | Freq: Once | INTRAVENOUS | Status: AC | PRN
  Administered 2012-09-04: 500 [IU]
  Filled 2012-09-04: qty 5

## 2012-09-04 MED ORDER — SODIUM CHLORIDE 0.9 % IV SOLN
600.0000 mg | Freq: Once | INTRAVENOUS | Status: AC
  Administered 2012-09-04: 600 mg via INTRAVENOUS
  Filled 2012-09-04: qty 60

## 2012-09-04 NOTE — Progress Notes (Signed)
This office note has been dictated.

## 2012-09-04 NOTE — Patient Instructions (Addendum)
[facility] Discharge Instructions for Patients Receiving Chemotherapy  Today you received the following chemotherapy agents Alimta and Carboplatin.  To help prevent nausea and vomiting after your treatment, we encourage you to take your nausea medication as prescribed.   If you develop nausea and vomiting that is not controlled by your nausea medication, call the clinic.   BELOW ARE SYMPTOMS THAT SHOULD BE REPORTED IMMEDIATELY:  *FEVER GREATER THAN 100.5 F  *CHILLS WITH OR WITHOUT FEVER  NAUSEA AND VOMITING THAT IS NOT CONTROLLED WITH YOUR NAUSEA MEDICATION  *UNUSUAL SHORTNESS OF BREATH  *UNUSUAL BRUISING OR BLEEDING  TENDERNESS IN MOUTH AND THROAT WITH OR WITHOUT PRESENCE OF ULCERS  *URINARY PROBLEMS  *BOWEL PROBLEMS  UNUSUAL RASH Items with * indicate a potential emergency and should be followed up as soon as possible.  Feel free to call the clinic you have any questions or concerns. The clinic phone number is [phone].    

## 2012-09-05 NOTE — Progress Notes (Signed)
CC:   [name], M.D.  DIAGNOSES: 1. 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 2. Invasive lobular carcinoma of the right breast.  CURRENT THERAPY: 1. The patient is status post 3 cycles of carboplatin/Alimta/Avastin. 2. Zometa 4 mg IV q.3 weeks. 3. Aromasin 25 mg p.o. daily.  INTERIM HISTORY:  [name] comes in for her followup.  We finally got a response from treatment.  We tried her on Tarceva and afatinib. Unfortunately, she did not respond to either for all that long.  We started her chemotherapy.  We finally got a response with chemotherapy.  CT scans done on August 14th show a decrease in her left lower lung mass, now measuring 3.7 x 4.8 cm.  She had a decrease in her pulmonary nodules.  She had improvement in mediastinal adenopathy.  She had a decrease in liver metastasis from 2.3 cm down to 1.1 cm.  She has stable bone disease.  She is feeling well.  Pain has not been too much of a problem.  She has tolerated chemotherapy okay.  She is not sleeping all that well because of Decadron.  There have been no problems with bowels or bladder.  She has had no leg swelling.  There have been no rashes.  She has had no headache.  Overall, her performance status is ECOG 1.  PHYSICAL EXAMINATION:  General:  This is a well-developed, well- nourished white female in no obvious distress.  Vital signs: Temperature of 98, pulse 84, respiratory rate 16, blood pressure 146/84. Weight is 167.  Head and neck:  Normocephalic, atraumatic skull.  There are no ocular or oral lesions.  There are no palpable cervical or supraclavicular lymph nodes.  Lungs:  Clear to percussion and auscultation bilaterally.  Cardiac:  Regular rate and rhythm with a normal S1 and S2.  There are no murmurs, rubs or bruits.  Abdomen: Soft.  She has good bowel sounds.  There is no fluid wave.  There is no palpable hepatosplenomegaly.  Back:  Shows no tenderness over the spine, ribs, or hips.   Extremities:  Show no clubbing, cyanosis or edema. Neurological:  Shows no focal neurological deficits.  Skin:  Shows no rashes, ecchymosis, or petechiae.  LABORATORY STUDIES:  White cell count is 6.4, hemoglobin 10.8, hematocrit 33.5, platelet count 456.  Calcium is 9.2 with an albumin of 3.3.  IMPRESSION:  [name] is a very charming 57 year old white female with metastatic adenocarcinoma of the lung.  She had the EGFR mutation. She responded transiently to Tarceva and afatinib.  She is now responding to chemotherapy.  We will go ahead and plan to treat her with her 4th cycle today.  I will plan for 6 cycles total.  I will plan for scans after the 6th cycle.  We will plan to get her back in another 3 weeks.    ______________________________ [name], M.D. PRE/MEDQ  D:  09/04/2012  T:  09/05/2012  Job:  [id number]

## 2012-09-06 ENCOUNTER — Other Ambulatory Visit: Payer: Self-pay | Admitting: Radiation Therapy

## 2012-09-06 DIAGNOSIS — C7931 Secondary malignant neoplasm of brain: Secondary | ICD-10-CM

## 2012-09-09 NOTE — Progress Notes (Signed)
This office note has been dictated.

## 2012-09-26 ENCOUNTER — Other Ambulatory Visit (HOSPITAL_BASED_OUTPATIENT_CLINIC_OR_DEPARTMENT_OTHER): Payer: BC Managed Care – PPO | Admitting: Lab

## 2012-09-26 ENCOUNTER — Other Ambulatory Visit: Payer: Self-pay | Admitting: Dermatology

## 2012-09-26 ENCOUNTER — Ambulatory Visit (HOSPITAL_BASED_OUTPATIENT_CLINIC_OR_DEPARTMENT_OTHER): Payer: BC Managed Care – PPO

## 2012-09-26 ENCOUNTER — Ambulatory Visit (HOSPITAL_BASED_OUTPATIENT_CLINIC_OR_DEPARTMENT_OTHER): Payer: BC Managed Care – PPO | Admitting: Hematology & Oncology

## 2012-09-26 VITALS — BP 148/75 | HR 75 | Temp 97.9°F | Resp 16 | Ht 62.0 in | Wt 169.0 lb

## 2012-09-26 DIAGNOSIS — C787 Secondary malignant neoplasm of liver and intrahepatic bile duct: Secondary | ICD-10-CM

## 2012-09-26 DIAGNOSIS — C7951 Secondary malignant neoplasm of bone: Secondary | ICD-10-CM

## 2012-09-26 DIAGNOSIS — C50419 Malignant neoplasm of upper-outer quadrant of unspecified female breast: Secondary | ICD-10-CM

## 2012-09-26 DIAGNOSIS — Z5111 Encounter for antineoplastic chemotherapy: Secondary | ICD-10-CM

## 2012-09-26 DIAGNOSIS — C3430 Malignant neoplasm of lower lobe, unspecified bronchus or lung: Secondary | ICD-10-CM

## 2012-09-26 DIAGNOSIS — Z5112 Encounter for antineoplastic immunotherapy: Secondary | ICD-10-CM

## 2012-09-26 DIAGNOSIS — C50911 Malignant neoplasm of unspecified site of right female breast: Secondary | ICD-10-CM

## 2012-09-26 DIAGNOSIS — H04203 Unspecified epiphora, bilateral lacrimal glands: Secondary | ICD-10-CM

## 2012-09-26 LAB — CMP (CANCER CENTER ONLY)
ALT(SGPT): 40 U/L (ref 10–47)
AST: 35 U/L (ref 11–38)
Albumin: 3.3 g/dL (ref 3.3–5.5)
Calcium: 9.3 mg/dL (ref 8.0–10.3)
Chloride: 105 mEq/L (ref 98–108)
Potassium: 4.2 mEq/L (ref 3.3–4.7)

## 2012-09-26 LAB — CBC WITH DIFFERENTIAL (CANCER CENTER ONLY)
BASO%: 0.5 % (ref 0.0–2.0)
EOS%: 0 % (ref 0.0–7.0)
HGB: 10.3 g/dL — ABNORMAL LOW (ref 11.6–15.9)
LYMPH#: 0.8 10*3/uL — ABNORMAL LOW (ref 0.9–3.3)
MCHC: 32.7 g/dL (ref 32.0–36.0)
NEUT#: 6.6 10*3/uL — ABNORMAL HIGH (ref 1.5–6.5)
Platelets: 442 10*3/uL — ABNORMAL HIGH (ref 145–400)
RDW: 24.2 % — ABNORMAL HIGH (ref 11.1–15.7)

## 2012-09-26 MED ORDER — HEPARIN SOD (PORK) LOCK FLUSH 100 UNIT/ML IV SOLN
500.0000 [IU] | Freq: Once | INTRAVENOUS | Status: AC | PRN
  Administered 2012-09-26: 500 [IU]
  Filled 2012-09-26: qty 5

## 2012-09-26 MED ORDER — NAPHAZOLINE HCL 0.02 % OP SOLN: OPHTHALMIC | Status: DC

## 2012-09-26 MED ORDER — SODIUM CHLORIDE 0.9 % IJ SOLN
10.0000 mL | INTRAMUSCULAR | Status: DC | PRN
  Administered 2012-09-26: 10 mL
  Filled 2012-09-26: qty 10

## 2012-09-26 MED ORDER — SODIUM CHLORIDE 0.9 % IV SOLN
Freq: Once | INTRAVENOUS | Status: AC
  Administered 2012-09-26: 12:00:00 via INTRAVENOUS

## 2012-09-26 MED ORDER — SODIUM CHLORIDE 0.9 % IV SOLN
500.0000 mg/m2 | Freq: Once | INTRAVENOUS | Status: AC
  Administered 2012-09-26: 900 mg via INTRAVENOUS
  Filled 2012-09-26: qty 36

## 2012-09-26 MED ORDER — ZOLEDRONIC ACID 4 MG/100ML IV SOLN
4.0000 mg | Freq: Once | INTRAVENOUS | Status: AC
  Administered 2012-09-26: 4 mg via INTRAVENOUS
  Filled 2012-09-26: qty 100

## 2012-09-26 MED ORDER — DEXAMETHASONE SODIUM PHOSPHATE 20 MG/5ML IJ SOLN
20.0000 mg | Freq: Once | INTRAMUSCULAR | Status: AC
  Administered 2012-09-26: 20 mg via INTRAVENOUS
  Filled 2012-09-26: qty 5

## 2012-09-26 MED ORDER — ONDANSETRON 16 MG/50ML IVPB (CHCC)
16.0000 mg | Freq: Once | INTRAVENOUS | Status: AC
  Administered 2012-09-26: 16 mg via INTRAVENOUS

## 2012-09-26 MED ORDER — SODIUM CHLORIDE 0.9 % IV SOLN
15.0000 mg/kg | Freq: Once | INTRAVENOUS | Status: AC
  Administered 2012-09-26: 1125 mg via INTRAVENOUS
  Filled 2012-09-26: qty 45

## 2012-09-26 MED ORDER — SODIUM CHLORIDE 0.9 % IV SOLN
600.0000 mg | Freq: Once | INTRAVENOUS | Status: AC
  Administered 2012-09-26: 600 mg via INTRAVENOUS
  Filled 2012-09-26: qty 60

## 2012-09-26 NOTE — Patient Instructions (Signed)
[facility] Discharge Instructions for Patients Receiving Chemotherapy  Today you received the following chemotherapy agents Avastin, Carboplatin, Alimta  To help prevent nausea and vomiting after your treatment, we encourage you to take your nausea medication    If you develop nausea and vomiting that is not controlled by your nausea medication, call the clinic.   BELOW ARE SYMPTOMS THAT SHOULD BE REPORTED IMMEDIATELY:  *FEVER GREATER THAN 100.5 F  *CHILLS WITH OR WITHOUT FEVER  NAUSEA AND VOMITING THAT IS NOT CONTROLLED WITH YOUR NAUSEA MEDICATION  *UNUSUAL SHORTNESS OF BREATH  *UNUSUAL BRUISING OR BLEEDING  TENDERNESS IN MOUTH AND THROAT WITH OR WITHOUT PRESENCE OF ULCERS  *URINARY PROBLEMS  *BOWEL PROBLEMS  UNUSUAL RASH Items with * indicate a potential emergency and should be followed up as soon as possible.  Feel free to call the clinic you have any questions or concerns. The clinic phone number is [phone].

## 2012-09-26 NOTE — Progress Notes (Signed)
This office note has been dictated.

## 2012-09-26 NOTE — Addendum Note (Signed)
Addended by: Arlan Organ R on: 09/26/2012 11:56 AM   Modules accepted: Orders

## 2012-09-27 ENCOUNTER — Other Ambulatory Visit: Payer: Self-pay | Admitting: Hematology & Oncology

## 2012-09-27 NOTE — Progress Notes (Signed)
CC:   [name], M.D.  DIAGNOSES: 1. 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 2. Invasive lobular carcinoma of the right breast.  CURRENT THERAPY: 1. Patient is status post 4 cycles of carboplatin/Alimta/Avastin. 2. Aromasin 25 mg p.o. daily. 3. Zometa 4 mg IV q.3 weeks.  INTERIM HISTORY:  Ms. Thalman comes in for followup.  She is doing well with the chemotherapy.  Her main complaint has been some watery eyes. Again, I suspect that this probably is from the chemotherapy.  We may try some eyedrops for her.  She has had very little nausea.  She is a little  constipated, but this is chronic.  She has had no cough or shortness of breath.  She is not complaining much in the way of back discomfort now.  There has been no leg swelling.  She has had no rashes.  She is working without difficulties. She is doing well with the Aromasin for her breast cancer.  Overall, her performance status is ECOG 1.  PHYSICAL EXAMINATION:  General:  This is a well-developed, well- nourished white female in no obvious distress.  Vital signs: Temperature of 97.9, pulse 90, respiratory rate 16, blood pressure 151/85, weight is 169.  Head/Neck:  Normocephalic, atraumatic skull. There are no ocular or oral lesions.  There are no palpable cervical or supraclavicular lymph nodes.  Lungs:  Clear bilaterally.  Cardiac: Regular rate and rhythm with a normal S1, S2.  There are no murmurs, rubs or bruits.  Abdomen:  Soft.  She has good bowel sounds.  There is no fluid wave.  There is no palpable hepatosplenomegaly.  Extremities: Show no clubbing, cyanosis or edema.  Neurological:  Shows no focal neurological deficits.  LABORATORY STUDIES:  White cell count is 8.3, hemoglobin 10.3, hematocrit 31.5, platelet count 442, sodium 146, potassium 4.2, BUN 10, creatinine 0.7, calcium 9.2 with an albumin of 3.3.  IMPRESSION:  Ms. Panzer is a very charming 57 year old  white female with metastatic lung cancer.  She has adenocarcinoma.  So far, she has shown a nice response to chemotherapy.  Her performance status is good.  We will go ahead and plan for her fifth cycle of chemotherapy today.  I will plan for __________.  If we see a response, then we will get her on to Alimta/Avastin as maintenance therapy.  The fact that her platelet count is coming down is certainly encouraging.  The fact that her alkaline phosphatase is coming down is also encouraging. I reviewed her lab work with her.  She and her husband are quite pleased with how well she is doing.  We will give her some steroid eyedrops to see if we can help with the watery eyes.  I will see her back in 3 weeks.    ______________________________ Josph Macho, M.D. PRE/MEDQ  D:  09/26/2012  T:  09/27/2012  Job:  [id number]

## 2012-10-04 ENCOUNTER — Other Ambulatory Visit: Payer: Self-pay | Admitting: *Deleted

## 2012-10-04 DIAGNOSIS — C3490 Malignant neoplasm of unspecified part of unspecified bronchus or lung: Secondary | ICD-10-CM

## 2012-10-04 MED ORDER — LORAZEPAM 0.5 MG PO TABS: 0.5000 mg | ORAL_TABLET | Freq: Four times a day (QID) | ORAL | Status: DC | PRN

## 2012-10-10 ENCOUNTER — Telehealth: Payer: Self-pay | Admitting: Hematology & Oncology

## 2012-10-10 NOTE — Telephone Encounter (Signed)
Faxed Medical Records via fax today to:  [facility]  P: [phone] F: [phone]   Medicare requested 07/24/2012 - MD Eval, Tx Plan with proposed meds regimen, disease staging, failed prior therapies.   COPY SCANNED

## 2012-10-11 ENCOUNTER — Encounter: Payer: Self-pay | Admitting: Radiation Oncology

## 2012-10-11 NOTE — Progress Notes (Signed)
10/08/12 9:50am - called patient's insurance carrier, [employer], and spoke with Tiffany.  No prior authorization is required for MRI, CT, PET scans; however, patient MUST call in to the Benefit Value Advisor at [phone] PRIOR to any of these tests being performed.  If they call in and listen the the information provided, the $100 copay per procedure will be waived.  Radiology exams are subject to patient's individual deductible of $350 per contract year (family $1050).  Insurance will cover 75% of allowed amount and patient's responsibility is 25%.  Relayed this information to [name] who understood this and will comply prior to her MRI next week.  Ref #[id number] for call to [facility].

## 2012-10-16 ENCOUNTER — Ambulatory Visit (HOSPITAL_BASED_OUTPATIENT_CLINIC_OR_DEPARTMENT_OTHER): Payer: BC Managed Care – PPO

## 2012-10-16 ENCOUNTER — Ambulatory Visit (HOSPITAL_BASED_OUTPATIENT_CLINIC_OR_DEPARTMENT_OTHER): Payer: BC Managed Care – PPO | Admitting: Hematology & Oncology

## 2012-10-16 ENCOUNTER — Other Ambulatory Visit (HOSPITAL_BASED_OUTPATIENT_CLINIC_OR_DEPARTMENT_OTHER): Payer: BC Managed Care – PPO | Admitting: Lab

## 2012-10-16 VITALS — BP 150/87 | HR 90 | Temp 98.2°F | Resp 14 | Ht 62.0 in | Wt 170.0 lb

## 2012-10-16 DIAGNOSIS — C7951 Secondary malignant neoplasm of bone: Secondary | ICD-10-CM

## 2012-10-16 DIAGNOSIS — C3492 Malignant neoplasm of unspecified part of left bronchus or lung: Secondary | ICD-10-CM

## 2012-10-16 DIAGNOSIS — C3490 Malignant neoplasm of unspecified part of unspecified bronchus or lung: Secondary | ICD-10-CM

## 2012-10-16 DIAGNOSIS — C801 Malignant (primary) neoplasm, unspecified: Secondary | ICD-10-CM

## 2012-10-16 DIAGNOSIS — Z5112 Encounter for antineoplastic immunotherapy: Secondary | ICD-10-CM

## 2012-10-16 DIAGNOSIS — C50419 Malignant neoplasm of upper-outer quadrant of unspecified female breast: Secondary | ICD-10-CM

## 2012-10-16 DIAGNOSIS — C3430 Malignant neoplasm of lower lobe, unspecified bronchus or lung: Secondary | ICD-10-CM

## 2012-10-16 DIAGNOSIS — C50911 Malignant neoplasm of unspecified site of right female breast: Secondary | ICD-10-CM

## 2012-10-16 DIAGNOSIS — Z5111 Encounter for antineoplastic chemotherapy: Secondary | ICD-10-CM

## 2012-10-16 LAB — CBC WITH DIFFERENTIAL (CANCER CENTER ONLY)
BASO#: 0 10*3/uL (ref 0.0–0.2)
EOS%: 0.8 % (ref 0.0–7.0)
Eosinophils Absolute: 0 10*3/uL (ref 0.0–0.5)
HCT: 31.1 % — ABNORMAL LOW (ref 34.8–46.6)
HGB: 10.2 g/dL — ABNORMAL LOW (ref 11.6–15.9)
LYMPH%: 11.8 % — ABNORMAL LOW (ref 14.0–48.0)
MCH: 33.2 pg (ref 26.0–34.0)
MCHC: 32.8 g/dL (ref 32.0–36.0)
MCV: 101 fL (ref 81–101)
MONO%: 5.9 % (ref 0.0–13.0)
NEUT%: 80.4 % — ABNORMAL HIGH (ref 39.6–80.0)
RBC: 3.07 10*6/uL — ABNORMAL LOW (ref 3.70–5.32)

## 2012-10-16 LAB — CMP (CANCER CENTER ONLY)
Alkaline Phosphatase: 195 U/L — ABNORMAL HIGH (ref 26–84)
BUN, Bld: 8 mg/dL (ref 7–22)
CO2: 28 mEq/L (ref 18–33)
Glucose, Bld: 129 mg/dL — ABNORMAL HIGH (ref 73–118)
Sodium: 144 mEq/L (ref 128–145)
Total Bilirubin: 0.5 mg/dl (ref 0.20–1.60)
Total Protein: 7.2 g/dL (ref 6.4–8.1)

## 2012-10-16 MED ORDER — DEXAMETHASONE SODIUM PHOSPHATE 20 MG/5ML IJ SOLN
20.0000 mg | Freq: Once | INTRAMUSCULAR | Status: AC
  Administered 2012-10-16: 20 mg via INTRAVENOUS

## 2012-10-16 MED ORDER — SODIUM CHLORIDE 0.9 % IV SOLN
527.5000 mg | Freq: Once | INTRAVENOUS | Status: AC
  Administered 2012-10-16: 530 mg via INTRAVENOUS
  Filled 2012-10-16: qty 53

## 2012-10-16 MED ORDER — CYANOCOBALAMIN 1000 MCG/ML IJ SOLN
1000.0000 ug | INTRAMUSCULAR | Status: DC
  Administered 2012-10-16: 1000 ug via INTRAMUSCULAR

## 2012-10-16 MED ORDER — ONDANSETRON 16 MG/50ML IVPB (CHCC)
INTRAVENOUS | Status: AC
  Filled 2012-10-16: qty 16

## 2012-10-16 MED ORDER — NAPHAZOLINE HCL 0.1 % OP SOLN: 1.0000 [drp] | Freq: Four times a day (QID) | OPHTHALMIC | Status: DC | PRN

## 2012-10-16 MED ORDER — ZOLEDRONIC ACID 4 MG/100ML IV SOLN
4.0000 mg | Freq: Once | INTRAVENOUS | Status: AC
  Administered 2012-10-16: 4 mg via INTRAVENOUS
  Filled 2012-10-16: qty 100

## 2012-10-16 MED ORDER — HEPARIN SOD (PORK) LOCK FLUSH 100 UNIT/ML IV SOLN
500.0000 [IU] | Freq: Once | INTRAVENOUS | Status: AC | PRN
  Administered 2012-10-16: 500 [IU]
  Filled 2012-10-16: qty 5

## 2012-10-16 MED ORDER — SODIUM CHLORIDE 0.9 % IV SOLN
15.0000 mg/kg | Freq: Once | INTRAVENOUS | Status: AC
  Administered 2012-10-16: 1125 mg via INTRAVENOUS
  Filled 2012-10-16: qty 45

## 2012-10-16 MED ORDER — ONDANSETRON 16 MG/50ML IVPB (CHCC)
16.0000 mg | Freq: Once | INTRAVENOUS | Status: AC
  Administered 2012-10-16: 16 mg via INTRAVENOUS

## 2012-10-16 MED ORDER — CYANOCOBALAMIN 1000 MCG/ML IJ SOLN
INTRAMUSCULAR | Status: AC
  Filled 2012-10-16: qty 1

## 2012-10-16 MED ORDER — SODIUM CHLORIDE 0.9 % IV SOLN
500.0000 mg/m2 | Freq: Once | INTRAVENOUS | Status: AC
  Administered 2012-10-16: 900 mg via INTRAVENOUS
  Filled 2012-10-16: qty 36

## 2012-10-16 MED ORDER — SODIUM CHLORIDE 0.9 % IV SOLN
Freq: Once | INTRAVENOUS | Status: AC
  Administered 2012-10-16: 13:00:00 via INTRAVENOUS

## 2012-10-16 MED ORDER — DEXAMETHASONE SODIUM PHOSPHATE 20 MG/5ML IJ SOLN
INTRAMUSCULAR | Status: AC
  Filled 2012-10-16: qty 5

## 2012-10-16 MED ORDER — SODIUM CHLORIDE 0.9 % IJ SOLN
10.0000 mL | INTRAMUSCULAR | Status: DC | PRN
  Administered 2012-10-16: 10 mL
  Filled 2012-10-16: qty 10

## 2012-10-16 NOTE — Progress Notes (Signed)
This office note has been dictated.

## 2012-10-16 NOTE — Patient Instructions (Signed)
[facility] Discharge Instructions for Patients Receiving Chemotherapy  Today you received the following chemotherapy agents Zometa, Alimta, Avastin, Carboplatin, B12.  To help prevent nausea and vomiting after your treatment, we encourage you to take your nausea medication as prescribed.   If you develop nausea and vomiting that is not controlled by your nausea medication, call the clinic.   BELOW ARE SYMPTOMS THAT SHOULD BE REPORTED IMMEDIATELY:  *FEVER GREATER THAN 100.5 F  *CHILLS WITH OR WITHOUT FEVER  NAUSEA AND VOMITING THAT IS NOT CONTROLLED WITH YOUR NAUSEA MEDICATION  *UNUSUAL SHORTNESS OF BREATH  *UNUSUAL BRUISING OR BLEEDING  TENDERNESS IN MOUTH AND THROAT WITH OR WITHOUT PRESENCE OF ULCERS  *URINARY PROBLEMS  *BOWEL PROBLEMS  UNUSUAL RASH Items with * indicate a potential emergency and should be followed up as soon as possible.  Feel free to call the clinic you have any questions or concerns. The clinic phone number is [phone].

## 2012-10-17 NOTE — Progress Notes (Signed)
CC:   [name], M.D.  DIAGNOSES: 1. Metastatic adenocarcinoma of the lung, EGFR positive. 2. Invasive lobular carcinoma of the right breast.  CURRENT THERAPY: 1. Patient is post 5 cycles of carboplatin/Alimta/Avastin. 2. Aromasin 25 mg p.o. daily. 3. Zometa 4 mg IV q.3 weeks.  INTERIM HISTORY:  Ms. Fabre comes in for her followup.  She continues to do well with the chemotherapy.  She has done very nicely.  Nausea and vomiting have not been too bad of a problem.  She is still working.  She is trying to work full-time.  Her back pain seems to be getting better.  She says that hurts after she works, but she thinks this might be more of her chair than anything else.  She has had no problems with bowels or bladder.  There is no constipation.  She has had some watery eyes.  We will give her some eyedrops for this.  She has had no leg swelling.  She has had no rashes.  There has been no headache.  Her performance status is ECOG 1.  PHYSICAL EXAMINATION:  General:  This is a well-developed, well- nourished white female in no obvious distress.  Vital signs: Temperature of 98.2, pulse 90, respiratory rate 14, blood pressure 150/87.  Weight is 170 pounds.  Head and neck:  Normocephalic, atraumatic skull.  There are no ocular or oral lesions.  There are no palpable cervical or supraclavicular lymph nodes.  Lungs:  Clear to percussion and auscultation bilaterally.  Cardiac:  Regular rate and rhythm with a normal S1 and S2.  There are no murmurs, rubs or bruits. Abdomen:  Soft.  She has good bowel sounds.  There is no fluid wave. There is no palpable hepatosplenomegaly.  Back:  No tenderness over the spine, ribs, or hips.  Extremities:  No clubbing, cyanosis or edema. Neurologic:  No focal neurological deficit.  LABORATORY STUDIES:  White cell count is 3.7, hemoglobin 10.2, hematocrit 31.1, platelet count 262.  Sodium 144, potassium 4.4, calcium 9.5, with an albumin of 3.6.   Alkaline phosphatase is 195.  IMPRESSION:  Ms. Hudson is a very charming 57 year old white female with metastatic adenocarcinoma of the lung.  She was EGFR positive. She, unfortunately, progressed through Tarceva and Gilotrif.  She has responded to chemotherapy.  We will go ahead with her 6th and final cycle of full-dose chemotherapy.  We will then go ahead and plan for followup CT scans.  I also will probably get a mammogram to check for the carcinoma of the right breast.  Once we see that she is responding and still responding, then we can get her on maintenance therapy with Alimta.  We will have her come back for her scans in about 3 weeks.  I will see her back in about 4 or 5 weeks.  I spent a good 30 minutes or so with Ms. Dimartino today.  I reviewed her lab work with her.  I went over my recommendations.  She and her husband both agree.    ______________________________ Josph Macho, M.D. PRE/MEDQ  D:  10/16/2012  T:  10/17/2012  Job:  [id number]

## 2012-10-18 ENCOUNTER — Ambulatory Visit
Admission: RE | Admit: 2012-10-18 | Discharge: 2012-10-18 | Disposition: A | Discharge status: Home / Self Care | Payer: BC Managed Care – PPO | Source: Ambulatory Visit | Attending: Radiation Oncology | Admitting: Radiation Oncology

## 2012-10-18 DIAGNOSIS — C7931 Secondary malignant neoplasm of brain: Secondary | ICD-10-CM

## 2012-10-18 MED ORDER — GADOBENATE DIMEGLUMINE 529 MG/ML IV SOLN
16.0000 mL | Freq: Once | INTRAVENOUS | Status: AC | PRN
  Administered 2012-10-18: 16 mL via INTRAVENOUS

## 2012-10-21 ENCOUNTER — Ambulatory Visit
Admission: RE | Admit: 2012-10-21 | Discharge: 2012-10-21 | Disposition: A | Discharge status: Home / Self Care | Payer: BC Managed Care – PPO | Source: Ambulatory Visit | Attending: Neurosurgery | Admitting: Neurosurgery

## 2012-10-21 ENCOUNTER — Other Ambulatory Visit: Payer: Self-pay | Admitting: Radiation Therapy

## 2012-10-21 ENCOUNTER — Ambulatory Visit
Admission: RE | Admit: 2012-10-21 | Discharge: 2012-10-21 | Disposition: A | Discharge status: Home / Self Care | Payer: BC Managed Care – PPO | Source: Ambulatory Visit | Attending: Radiation Oncology | Admitting: Radiation Oncology

## 2012-10-21 VITALS — BP 135/95 | HR 110 | Temp 98.5°F | Wt 166.1 lb

## 2012-10-21 DIAGNOSIS — C7931 Secondary malignant neoplasm of brain: Secondary | ICD-10-CM

## 2012-10-21 DIAGNOSIS — Z923 Personal history of irradiation: Secondary | ICD-10-CM

## 2012-10-21 HISTORY — DX: Personal history of irradiation: Z92.3

## 2012-10-21 NOTE — Progress Notes (Signed)
Radiation Oncology         [phone] ________________________________  Name: [name] MRN: [id number]  Date: 10/21/2012  DOB: 11/15/1955  Follow-Up Visit Note  outpatient  CC: [name], MD  [name], MD  Diagnosis:   Adenocarcinoma of the lung, with brain metastases  She completed stereotactic radiosurgery to the Left temporal 5mm target, Left cerebellar 3mm target , Left posterior superior frontal 2mm target, Right anterior temporal 3mm target , Right superior vermian 4mm target , Left occipital 3mm target, all to 20 Gy in 07-26-12  She completed stereotactic radiosurgery to the right frontal, right thalamic, left frontal and left occipital target, 20 Gray in 1 fraction to each target, on 07/19/11.  07/05/2011-07/21/2011 - LS spine and thoracic spine (T7-T9) 30 Gy in 10 sessions   Narrative:  The patient returns today for routine follow-up.  She is doing well, working. ECOG 1.       Most recent MRI of the brain was discussed at CNS tumor board.  It was performed on 10-18-12 and  demonstrates  1. Two new punctate enhancing brain metastases in the right  hemisphere (series 10, image 123 and image 107).  2. Six stable to decreased enhancing metastases. The largest of  these is at the right anterior insula.  3. In addition, there are 4 punctate areas of enhancement in the  bilateral thalami (3 on the rightside) which are suspicious for tiny  metastases com but seem to be unchanged since June. Given the  abundant physiologic enhancement in the deep gray matter nuclei, it  is difficult to exclude that these are related to vasculature,  although they were not clearly demonstrated on a 2013 comparison.  Attention on followup may be most appropriate.  4. Small left oropharyngeal tonsillar pillar new T2 hyperintense  lesion, favor benign retention cyst.    Symptomatically, she denies  seizures, headaches, nausea, new neurologic deficits, visual changes.      Status  of systemic disease: improving with chemotherapy per CT imaging. She recently receiving cycle 6 of 6 of Alimta, Carbo, Avastin.        ALLERGIES:  has No Known Allergies.  Meds: Current Outpatient Prescriptions  Medication Sig Dispense Refill  . ALPRAZolam (XANAX) 0.25 MG tablet Take 0.25-0.5 mg by mouth every 8 (eight) hours as needed. For anxiety.      . DiphenhydrAMINE HCl (ZZZQUIL) 50 MG/30ML LIQD Take by mouth at bedtime as needed.      Marland Kitchen exemestane (AROMASIN) 25 MG tablet Take 1 tablet (25 mg total) by mouth daily after breakfast.  30 tablet  6  . folic acid (FOLVITE) 1 MG tablet Take 1 mg by mouth daily.      Marland Kitchen HYDROcodone-acetaminophen (NORCO) 10-325 MG per tablet Take 1 tablet by mouth every 6 (six) hours as needed for pain.      Marland Kitchen ibuprofen (ADVIL,MOTRIN) 200 MG tablet Take 600 mg by mouth every 6 (six) hours as needed for pain.       Marland Kitchen lidocaine-prilocaine (EMLA) cream Apply topically as needed.       . loperamide (IMODIUM) 2 MG capsule Take 2 mg by mouth 4 (four) times daily as needed. For diarrhea      . loratadine (CLARITIN) 10 MG tablet Take 10 mg by mouth daily.      Marland Kitchen LORazepam (ATIVAN) 0.5 MG tablet Take 1 tablet (0.5 mg total) by mouth every 6 (six) hours as needed for anxiety.  30 tablet  3  .  methylphenidate (RITALIN) 5 MG tablet Take 5 mg by mouth as needed. Take after chemo if needed      . naphazoline (NAPHCON) 0.1 % ophthalmic solution Place 1 drop into both eyes 4 (four) times daily as needed.  15 mL  2  . Naphazoline HCl (VASOCLEAR) 0.02 % SOLN APpply 1 drop to each eye 3 times a day.  1 Bottle  2  . ondansetron (ZOFRAN) 8 MG tablet Take 1 tablet (8 mg total) by mouth every 12 (twelve) hours as needed.  20 tablet  4  . Probiotic Product (ALIGN) 4 MG CAPS Take by mouth every morning.      . prochlorperazine (COMPAZINE) 10 MG tablet Take 1 tablet (10 mg total) by mouth as needed.  30 tablet  3  . promethazine (PHENERGAN) 12.5 MG tablet Take 12.5 mg by mouth as  needed. for nausea      . Skin Protectants, Misc. (EUCERIN) cream Apply 1 application topically every morning.      Marland Kitchen ZOHYDRO ER 20 MG CP12 Take by mouth. Daily or bid ??      . zolendronic acid (ZOMETA) 4 MG/5ML injection Inject 4 mg into the vein every 30 (thirty) days.      Marland Kitchen zolpidem (AMBIEN) 10 MG tablet Take 10 mg by mouth at bedtime as needed.       Marland Kitchen dexamethasone (DECADRON) 4 MG tablet Take 1 tablet (4 mg total) by mouth 2 (two) times daily with a meal. As directed with Chemo  60 tablet  4  . triamterene-hydrochlorothiazide (MAXZIDE-25) 37.5-25 MG per tablet Take 1 tablet by mouth every morning.       No current facility-administered medications for this encounter.   Facility-Administered Medications Ordered in Other Encounters  Medication Dose Route Frequency Provider Last Rate Last Dose  . sodium chloride 0.9 % injection 10 mL  10 mL Intracatheter PRN Josph Macho, MD   10 mL at 09/04/12 1511    Physical Findings: The patient is in no acute distress. Patient is alert and oriented.  weight is 166 lb 1.6 oz (75.342 kg). Her temperature is 98.5 F (36.9 C). Her blood pressure is 135/95 and her pulse is 110. Her oxygen saturation is 100%. .    General: Alert and oriented, in no acute distress HEENT: Head is normocephalic. Pupils are equally round and reactive to light. Extraocular movements are intact. Oropharynx is clear. Neck: Neck is supple, no palpable cervical or supraclavicular lymphadenopathy. Heart: Regular in rate and rhythm with no murmurs, rubs, or gallops. Chest: Clear to auscultation bilaterally, with no rhonchi, wheezes, or rales. Musculoskeletal: symmetric strength and muscle tone throughout. Neurologic: Cranial nerves II through XII are grossly intact. No obvious focalities. Speech is fluent. Coordination is intact per RAM's and finger to nose testing. Ambulatory.  Psychiatric: Judgment and insight are intact. Affect is appropriate.   Lab Findings: Lab Results   Component Value Date   WBC 3.7* 10/16/2012   HGB 10.2* 10/16/2012   HCT 31.1* 10/16/2012   MCV 101 10/16/2012   PLT 262 10/16/2012       Radiographic Findings: Mr Laqueta Jean UJ Contrast  10/18/2012   CLINICAL DATA:  58 year old female with metastatic lung cancer status post stereotactic radiosurgery for brain metastases. New metastases on the most recent comparison.  EXAM: MRI HEAD WITHOUT AND WITH CONTRAST  TECHNIQUE: Multiplanar, multiecho pulse sequences of the brain and surrounding structures were obtained according to standard protocol without and with intravenous contrast  CONTRAST:  16mL MULTIHANCE GADOBENATE DIMEGLUMINE 529 MG/ML IV SOLN  COMPARISON:  07/12/2012 and earlier.  FINDINGS: Several of the previously seen small enhancing metastases are no longer visible.  There are 6 stable to slightly decreased enhancing metastases. The largest of these is at the right anterior insula on series 2, image 60. The previously seen left cerebellar hemisphere metastasis is no longer enhancing.  There are 2 new punctate enhancing metastases identified (right superior frontal gyrus series 10, image 123, and right parietal loaded image 107).  In addition there are 4 punctate areas of enhancement in the bilateral thalami of unclear significance. These did not seen to be vessels, as they are not identified on the 09/22/2011 study. But at the same time several were visible in the June, and do not appear significantly changed (series 10, image 75 and image 79). They do not seem to be related to the previous Larger medial right thalamic met, which was faintly visible on that 2013 exam. Also, several additional small curvilinear areas of apparent enhancement in the medial right thalamus are visible on the precontrast T1 (series 8, images 80-82) PICC and therefore not truly enhancing.  No other new abnormal enhancement identified.  No intracranial mass effect. No acute intracranial hemorrhage identified. No restricted  diffusion or evidence of acute infarction. No ventriculomegaly. Negative pituitary and cervicomedullary junction. Grossly negative visualized cervical spine. Visualized bone marrow signal is within normal limits. Major intracranial vascular flow voids are stable.  Visualized orbit soft tissues are within normal limits. Stable paranasal sinuses. New left mastoid air cell fluid. Negative visualized nasopharynx.  Stable scalp soft tissues except for a small 7 mm T2 hyperintense area of the left tonsillar pillar which is new, but without enhancement (series 11, image 28).  IMPRESSION: 1. Two new punctate enhancing brain metastases in the right hemisphere (series 10, image 123 and image 107).  2. Six stable to decreased enhancing metastases. The largest of these is at the right anterior insula.  3. In addition, there are 4 punctate areas of enhancement in the bilateral thalami (3 on the rightside) which are suspicious for tiny metastases com but seem to be unchanged since June. Given the abundant physiologic enhancement in the deep gray matter nuclei, it is difficult to exclude that these are related to vasculature, although they were not clearly demonstrated on a 2013 comparison. Attention on followup may be most appropriate.  4. Small left oropharyngeal tonsillar pillar new T2 hyperintense lesion, favor benign retention cyst.   Electronically Signed   By: Augusto Gamble M.D.   On: 10/18/2012 16:04    Impression/Plan:  The two new lesions are very small.  She was discussed at our CNS tumor board this AM. The consensus is to closely follow her with an 8wk f/u MRI - it is likely she will need SRS to these lesions at that time.  But, this will allow [place] to determine if any other lesions blossom in the interim to warrant treatment at the same time. This would consolidate her treatments into one session and also allow some time for [place] to see how the lesions in the thalami are behaving. We reviewed her images together. She likes  this plan.  She wishes to have her scan and f/u right after Thanksgiving weekend.  I will be on maternity leave, so she will be Dr. Venetia Maxon and one of my partners after her MRI.  She was encouraged to call if any neurologic symptoms develop in the interim.   I spent  20 minutes face to face with the patient and more than 50% of that time was spent in counseling and/or coordination of care. _____________________________________   Lonie Peak, MD

## 2012-10-21 NOTE — Progress Notes (Signed)
Patient here for follow up completion of SRS to brain and LS sspine radiation.Denies pain , headache or nausea.No back pain.To review MRI  Brain 10/18/12.

## 2012-11-06 ENCOUNTER — Ambulatory Visit
Admission: RE | Admit: 2012-11-06 | Discharge: 2012-11-06 | Disposition: A | Discharge status: Home / Self Care | Payer: BC Managed Care – PPO | Source: Ambulatory Visit | Attending: Hematology & Oncology | Admitting: Hematology & Oncology

## 2012-11-06 ENCOUNTER — Ambulatory Visit (HOSPITAL_COMMUNITY)
Admission: RE | Admit: 2012-11-06 | Discharge: 2012-11-06 | Disposition: A | Discharge status: Home / Self Care | Payer: BC Managed Care – PPO | Source: Ambulatory Visit | Attending: Hematology & Oncology | Admitting: Hematology & Oncology

## 2012-11-06 ENCOUNTER — Encounter (HOSPITAL_COMMUNITY): Payer: Self-pay

## 2012-11-06 ENCOUNTER — Other Ambulatory Visit: Payer: Self-pay | Admitting: Hematology & Oncology

## 2012-11-06 DIAGNOSIS — C7951 Secondary malignant neoplasm of bone: Secondary | ICD-10-CM | POA: Insufficient documentation

## 2012-11-06 DIAGNOSIS — C50911 Malignant neoplasm of unspecified site of right female breast: Secondary | ICD-10-CM

## 2012-11-06 DIAGNOSIS — N289 Disorder of kidney and ureter, unspecified: Secondary | ICD-10-CM | POA: Insufficient documentation

## 2012-11-06 DIAGNOSIS — C3490 Malignant neoplasm of unspecified part of unspecified bronchus or lung: Secondary | ICD-10-CM | POA: Insufficient documentation

## 2012-11-06 DIAGNOSIS — K7689 Other specified diseases of liver: Secondary | ICD-10-CM | POA: Insufficient documentation

## 2012-11-06 DIAGNOSIS — C3492 Malignant neoplasm of unspecified part of left bronchus or lung: Secondary | ICD-10-CM

## 2012-11-06 MED ORDER — IOHEXOL 300 MG/ML  SOLN
100.0000 mL | Freq: Once | INTRAMUSCULAR | Status: AC | PRN
  Administered 2012-11-06: 100 mL via INTRAVENOUS

## 2012-11-07 ENCOUNTER — Telehealth: Payer: Self-pay | Admitting: Nurse Practitioner

## 2012-11-07 NOTE — Telephone Encounter (Deleted)
Message copied by Glee Arvin on Thu Nov 07, 2012 12:26 PM ------      Message from: Arlan Organ R      Created: Thu Nov 07, 2012  9:28 AM       Call - cancer is still shrinking!!  The breast tumor is also shrinking!!  Cindee Lame ------

## 2012-11-07 NOTE — Telephone Encounter (Addendum)
Message copied by Glee Arvin on Thu Nov 07, 2012 12:29 PM ------      Message from: Arlan Organ R      Created: Thu Nov 07, 2012  9:28 AM       Call - cancer is still shrinking!!  The breast tumor is also shrinking!!  Pete ------LVM for pt to call office at earliest convience. No detailed info left on her VM.

## 2012-11-15 ENCOUNTER — Telehealth: Payer: Self-pay | Admitting: Hematology & Oncology

## 2012-11-15 NOTE — Telephone Encounter (Signed)
I spoke and mailed pt her new updated schedule today. Pt  aware that Dr. Myna Hidalgo will not be in office on the date of her originally assigned schedule of 11/20/2012.

## 2012-11-20 ENCOUNTER — Ambulatory Visit: Payer: BC Managed Care – PPO

## 2012-11-20 ENCOUNTER — Ambulatory Visit: Payer: BC Managed Care – PPO | Admitting: Hematology & Oncology

## 2012-11-20 ENCOUNTER — Other Ambulatory Visit: Payer: BC Managed Care – PPO | Admitting: Lab

## 2012-11-27 ENCOUNTER — Other Ambulatory Visit (HOSPITAL_BASED_OUTPATIENT_CLINIC_OR_DEPARTMENT_OTHER): Payer: BC Managed Care – PPO | Admitting: Lab

## 2012-11-27 ENCOUNTER — Ambulatory Visit (HOSPITAL_BASED_OUTPATIENT_CLINIC_OR_DEPARTMENT_OTHER): Payer: BC Managed Care – PPO

## 2012-11-27 ENCOUNTER — Ambulatory Visit (HOSPITAL_BASED_OUTPATIENT_CLINIC_OR_DEPARTMENT_OTHER): Payer: BC Managed Care – PPO | Admitting: Hematology & Oncology

## 2012-11-27 VITALS — BP 154/90 | HR 93 | Temp 97.9°F | Resp 14 | Ht 62.0 in | Wt 170.0 lb

## 2012-11-27 DIAGNOSIS — C3490 Malignant neoplasm of unspecified part of unspecified bronchus or lung: Secondary | ICD-10-CM

## 2012-11-27 DIAGNOSIS — C3430 Malignant neoplasm of lower lobe, unspecified bronchus or lung: Secondary | ICD-10-CM

## 2012-11-27 DIAGNOSIS — C799 Secondary malignant neoplasm of unspecified site: Secondary | ICD-10-CM

## 2012-11-27 DIAGNOSIS — C50919 Malignant neoplasm of unspecified site of unspecified female breast: Secondary | ICD-10-CM

## 2012-11-27 DIAGNOSIS — Z5111 Encounter for antineoplastic chemotherapy: Secondary | ICD-10-CM

## 2012-11-27 DIAGNOSIS — C7951 Secondary malignant neoplasm of bone: Secondary | ICD-10-CM

## 2012-11-27 DIAGNOSIS — C3492 Malignant neoplasm of unspecified part of left bronchus or lung: Secondary | ICD-10-CM

## 2012-11-27 DIAGNOSIS — C7931 Secondary malignant neoplasm of brain: Secondary | ICD-10-CM

## 2012-11-27 DIAGNOSIS — C50911 Malignant neoplasm of unspecified site of right female breast: Secondary | ICD-10-CM

## 2012-11-27 LAB — CBC WITH DIFFERENTIAL (CANCER CENTER ONLY)
BASO#: 0 10*3/uL (ref 0.0–0.2)
Eosinophils Absolute: 0.1 10*3/uL (ref 0.0–0.5)
HCT: 32.9 % — ABNORMAL LOW (ref 34.8–46.6)
HGB: 10.6 g/dL — ABNORMAL LOW (ref 11.6–15.9)
MCH: 34.5 pg — ABNORMAL HIGH (ref 26.0–34.0)
MCHC: 32.2 g/dL (ref 32.0–36.0)
MCV: 107 fL — ABNORMAL HIGH (ref 81–101)
MONO%: 9.2 % (ref 0.0–13.0)
NEUT#: 3.8 10*3/uL (ref 1.5–6.5)
NEUT%: 75 % (ref 39.6–80.0)
Platelets: 314 10*3/uL (ref 145–400)
RBC: 3.07 10*6/uL — ABNORMAL LOW (ref 3.70–5.32)

## 2012-11-27 LAB — CMP (CANCER CENTER ONLY)
Albumin: 3.4 g/dL (ref 3.3–5.5)
CO2: 27 mEq/L (ref 18–33)
Calcium: 9.2 mg/dL (ref 8.0–10.3)
Chloride: 104 mEq/L (ref 98–108)
Glucose, Bld: 96 mg/dL (ref 73–118)
Sodium: 143 mEq/L (ref 128–145)
Total Bilirubin: 0.5 mg/dl (ref 0.20–1.60)
Total Protein: 7.4 g/dL (ref 6.4–8.1)

## 2012-11-27 LAB — LACTATE DEHYDROGENASE: LDH: 265 U/L — ABNORMAL HIGH (ref 94–250)

## 2012-11-27 MED ORDER — DEXAMETHASONE SODIUM PHOSPHATE 10 MG/ML IJ SOLN
INTRAMUSCULAR | Status: AC
  Filled 2012-11-27: qty 1

## 2012-11-27 MED ORDER — SODIUM CHLORIDE 0.9 % IV SOLN
Freq: Once | INTRAVENOUS | Status: AC
  Administered 2012-11-27: 10:00:00 via INTRAVENOUS

## 2012-11-27 MED ORDER — ONDANSETRON 8 MG/50ML IVPB (CHCC)
8.0000 mg | Freq: Once | INTRAVENOUS | Status: AC
  Administered 2012-11-27: 8 mg via INTRAVENOUS

## 2012-11-27 MED ORDER — ZOLEDRONIC ACID 4 MG/100ML IV SOLN
4.0000 mg | Freq: Once | INTRAVENOUS | Status: AC
  Administered 2012-11-27: 4 mg via INTRAVENOUS
  Filled 2012-11-27: qty 100

## 2012-11-27 MED ORDER — SODIUM CHLORIDE 0.9 % IV SOLN
500.0000 mg/m2 | Freq: Once | INTRAVENOUS | Status: AC
  Administered 2012-11-27: 925 mg via INTRAVENOUS
  Filled 2012-11-27: qty 37

## 2012-11-27 MED ORDER — HEPARIN SOD (PORK) LOCK FLUSH 100 UNIT/ML IV SOLN
500.0000 [IU] | Freq: Once | INTRAVENOUS | Status: AC | PRN
  Administered 2012-11-27: 500 [IU]
  Filled 2012-11-27: qty 5

## 2012-11-27 MED ORDER — SODIUM CHLORIDE 0.9 % IJ SOLN
10.0000 mL | INTRAMUSCULAR | Status: DC | PRN
  Administered 2012-11-27: 10 mL
  Filled 2012-11-27: qty 10

## 2012-11-27 MED ORDER — DEXAMETHASONE SODIUM PHOSPHATE 10 MG/ML IJ SOLN
10.0000 mg | Freq: Once | INTRAMUSCULAR | Status: AC
  Administered 2012-11-27: 10 mg via INTRAVENOUS

## 2012-11-27 MED ORDER — METHYLPHENIDATE HCL 5 MG PO TABS: 5.0000 mg | ORAL_TABLET | ORAL | Status: DC | PRN

## 2012-11-27 MED ORDER — LORAZEPAM 0.5 MG PO TABS: 0.5000 mg | ORAL_TABLET | Freq: Four times a day (QID) | ORAL | Status: DC | PRN

## 2012-11-27 NOTE — Patient Instructions (Signed)
[facility] Discharge Instructions for Patients Receiving Chemotherapy  Today you received chemotherapy agents. Alimta and Zometa  To help prevent nausea and vomiting after your treatment, we encourage you to take your nausea medication as prescribed. If you develop nausea and vomiting that is not controlled by your nausea medication, call the clinic. If it is after clinic hours your family physician or the after hours number for the clinic or go to the Emergency Department.   BELOW ARE SYMPTOMS THAT SHOULD BE REPORTED IMMEDIATELY:  *FEVER GREATER THAN 100.5 F  *CHILLS WITH OR WITHOUT FEVER  NAUSEA AND VOMITING THAT IS NOT CONTROLLED WITH YOUR NAUSEA MEDICATION  *UNUSUAL SHORTNESS OF BREATH  *UNUSUAL BRUISING OR BLEEDING  TENDERNESS IN MOUTH AND THROAT WITH OR WITHOUT PRESENCE OF ULCERS  *URINARY PROBLEMS  *BOWEL PROBLEMS  UNUSUAL RASH Items with * indicate a potential emergency and should be followed up as soon as possible.   Please let the nurse know about any problems that you may have experienced. Feel free to call the clinic you have any questions or concerns. The clinic phone number is [phone].   I have been informed and understand all the instructions given to me. I know to contact the clinic, my physician, or go to the Emergency Department if any problems should occur. I do not have any questions at this time, but understand that I may call the clinic during office hours   should I have any questions or need assistance in obtaining follow up care.    __________________________________________  _____________  __________ Signature of Patient or Authorized Representative            Date                   Time    __________________________________________ Nurse's Signature   Pemetrexed injection What is this medicine? PEMETREXED (PEM e TREX ed) is a chemotherapy drug. This medicine affects cells that are rapidly growing, such as cancer cells  and cells in your mouth and stomach. It is usually used to treat lung cancers like non-small cell lung cancer and mesothelioma. It may also be used to treat other cancers. This medicine may be used for other purposes; ask your health care provider or pharmacist if you have questions. COMMON BRAND NAME(S): Alimta What should I tell my health care provider before I take this medicine? They need to know if you have any of these conditions: -if you frequently drink alcohol containing beverages -infection (especially a virus infection such as chickenpox, cold sores, or herpes) -kidney disease -liver disease -low blood counts, like low platelets, red bloods, or white blood cells -an unusual or allergic reaction to pemetrexed, mannitol, other medicines, foods, dyes, or preservatives -pregnant or trying to get pregnant -breast-feeding How should I use this medicine? This drug is given as an infusion into a vein. It is administered in a hospital or clinic by a specially trained health care professional. Talk to your pediatrician regarding the use of this medicine in children. Special care may be needed. Overdosage: If you think you have taken too much of this medicine contact a poison control center or emergency room at once. NOTE: This medicine is only for you. Do not share this medicine with others. What if I miss a dose? It is important not to miss your dose. Call your doctor or health care professional if you are unable to keep an appointment. What may interact with this medicine? -aspirin and aspirin-like medicines -  medicines to increase blood counts like filgrastim, pegfilgrastim, sargramostim -methotrexate -NSAIDS, medicines for pain and inflammation, like ibuprofen or naproxen -probenecid -pyrimethamine -vaccines Talk to your doctor or health care professional before taking any of these medicines: -acetaminophen -aspirin -ibuprofen -ketoprofen -naproxen This list may not describe all  possible interactions. Give your health care provider a list of all the medicines, herbs, non-prescription drugs, or dietary supplements you use. Also tell them if you smoke, drink alcohol, or use illegal drugs. Some items may interact with your medicine. What should I watch for while using this medicine? Visit your doctor for checks on your progress. This drug may make you feel generally unwell. This is not uncommon, as chemotherapy can affect healthy cells as well as cancer cells. Report any side effects. Continue your course of treatment even though you feel ill unless your doctor tells you to stop. In some cases, you may be given additional medicines to help with side effects. Follow all directions for their use. Call your doctor or health care professional for advice if you get a fever, chills or sore throat, or other symptoms of a cold or flu. Do not treat yourself. This drug decreases your body's ability to fight infections. Try to avoid being around people who are sick. This medicine may increase your risk to bruise or bleed. Call your doctor or health care professional if you notice any unusual bleeding. Be careful brushing and flossing your teeth or using a toothpick because you may get an infection or bleed more easily. If you have any dental work done, tell your dentist you are receiving this medicine. Avoid taking products that contain aspirin, acetaminophen, ibuprofen, naproxen, or ketoprofen unless instructed by your doctor. These medicines may hide a fever. Call your doctor or health care professional if you get diarrhea or mouth sores. Do not treat yourself. To protect your kidneys, drink water or other fluids as directed while you are taking this medicine. Men and women must use effective birth control while taking this medicine. You may also need to continue using effective birth control for a time after stopping this medicine. Do not become pregnant while taking this medicine. Tell your  doctor right away if you think that you or your partner might be pregnant. There is a potential for serious side effects to an unborn child. Talk to your health care professional or pharmacist for more information. Do not breast-feed an infant while taking this medicine. This medicine may lower sperm counts. What side effects may I notice from receiving this medicine? Side effects that you should report to your doctor or health care professional as soon as possible: -allergic reactions like skin rash, itching or hives, swelling of the face, lips, or tongue -low blood counts - this medicine may decrease the number of white blood cells, red blood cells and platelets. You may be at increased risk for infections and bleeding. -signs of infection - fever or chills, cough, sore throat, pain or difficulty passing urine -signs of decreased platelets or bleeding - bruising, pinpoint red spots on the skin, black, tarry stools, blood in the urine -signs of decreased red blood cells - unusually weak or tired, fainting spells, lightheadedness -breathing problems, like a dry cough -changes in emotions or moods -chest pain -confusion -diarrhea -high blood pressure -mouth or throat sores or ulcers -pain, swelling, warmth in the leg -pain on swallowing -swelling of the ankles, feet, hands -trouble passing urine or change in the amount of urine -vomiting -yellowing of the  eyes or skin Side effects that usually do not require medical attention (report to your doctor or health care professional if they continue or are bothersome): -hair loss -loss of appetite -nausea -stomach upset This list may not describe all possible side effects. Call your doctor for medical advice about side effects. You may report side effects to FDA at 1-800-FDA-1088. Where should I keep my medicine? This drug is given in a hospital or clinic and will not be stored at home. NOTE: This sheet is a summary. It may not cover all  possible information. If you have questions about this medicine, talk to your doctor, pharmacist, or health care provider.  2014, [employer]vier/Gold Standard. (2007-08-06 13:24:03) Zoledronic Acid injection (Hypercalcemia, Oncology) What is this medicine? ZOLEDRONIC ACID (ZOE le dron ik AS id) lowers the amount of calcium loss from bone. It is used to treat too much calcium in your blood from cancer. It is also used to prevent complications of cancer that has spread to the bone. This medicine may be used for other purposes; ask your health care provider or pharmacist if you have questions. COMMON BRAND NAME(S): Zometa What should I tell my health care provider before I take this medicine? They need to know if you have any of these conditions: -aspirin-sensitive asthma -cancer, especially if you are receiving medicines used to treat cancer -dental disease or wear dentures -infection -kidney disease -receiving corticosteroids like dexamethasone or prednisone -an unusual or allergic reaction to zoledronic acid, other medicines, foods, dyes, or preservatives -pregnant or trying to get pregnant -breast-feeding How should I use this medicine? This medicine is for infusion into a vein. It is given by a health care professional in a hospital or clinic setting. Talk to your pediatrician regarding the use of this medicine in children. Special care may be needed. Overdosage: If you think you have taken too much of this medicine contact a poison control center or emergency room at once. NOTE: This medicine is only for you. Do not share this medicine with others. What if I miss a dose? It is important not to miss your dose. Call your doctor or health care professional if you are unable to keep an appointment. What may interact with this medicine? -certain antibiotics given by injection -NSAIDs, medicines for pain and inflammation, like ibuprofen or naproxen -some diuretics like bumetanide,  furosemide -teriparatide -thalidomide This list may not describe all possible interactions. Give your health care provider a list of all the medicines, herbs, non-prescription drugs, or dietary supplements you use. Also tell them if you smoke, drink alcohol, or use illegal drugs. Some items may interact with your medicine. What should I watch for while using this medicine? Visit your doctor or health care professional for regular checkups. It may be some time before you see the benefit from this medicine. Do not stop taking your medicine unless your doctor tells you to. Your doctor may order blood tests or other tests to see how you are doing. Women should inform their doctor if they wish to become pregnant or think they might be pregnant. There is a potential for serious side effects to an unborn child. Talk to your health care professional or pharmacist for more information. You should make sure that you get enough calcium and vitamin D while you are taking this medicine. Discuss the foods you eat and the vitamins you take with your health care professional. Some people who take this medicine have severe bone, joint, and/or muscle pain. This medicine may  also increase your risk for jaw problems or a broken thigh bone. Tell your doctor right away if you have severe pain in your jaw, bones, joints, or muscles. Tell your doctor if you have any pain that does not go away or that gets worse. Tell your dentist and dental surgeon that you are taking this medicine. You should not have major dental surgery while on this medicine. See your dentist to have a dental exam and fix any dental problems before starting this medicine. Take good care of your teeth while on this medicine. Make sure you see your dentist for regular follow-up appointments. What side effects may I notice from receiving this medicine? Side effects that you should report to your doctor or health care professional as soon as possible: -allergic  reactions like skin rash, itching or hives, swelling of the face, lips, or tongue -anxiety, confusion, or depression -breathing problems -changes in vision -eye pain -feeling faint or lightheaded, falls -jaw pain, especially after dental work -mouth sores -muscle cramps, stiffness, or weakness -trouble passing urine or change in the amount of urine Side effects that usually do not require medical attention (report to your doctor or health care professional if they continue or are bothersome): -bone, joint, or muscle pain -constipation -diarrhea -fever -hair loss -irritation at site where injected -loss of appetite -nausea, vomiting -stomach upset -trouble sleeping -trouble swallowing -weak or tired This list may not describe all possible side effects. Call your doctor for medical advice about side effects. You may report side effects to FDA at 1-800-FDA-1088. Where should I keep my medicine? This drug is given in a hospital or clinic and will not be stored at home. NOTE: This sheet is a summary. It may not cover all possible information. If you have questions about this medicine, talk to your doctor, pharmacist, or health care provider.  2014, Elsevier/Gold Standard. (2012-06-13 13:03:13)

## 2012-11-27 NOTE — Progress Notes (Signed)
This office note has been dictated.

## 2012-12-07 NOTE — Progress Notes (Signed)
CC:   [name], M.D.  DIAGNOSES: 1. Metastatic adenocarcinoma of the lung, EGFR positive. 2. Invasive lobular carcinoma of the right breast.  CURRENT THERAPY: 1. The patient to start maintenance therapy with q.3 week Alimta. 2. Aromasin 25 mg p.o. daily. 3. Zometa 4 mg IV q.3 weeks.  INTERIM HISTORY:  Ms. Stankovich comes in for followup.  She continues to do incredibly well.  She is very functional.  She is still working.  She and her husband went down to [place] to visit the son.  They had a great time down there.  Her husband, Karie Mainland, enjoyed all the deep fried food that was available at the [facility].  Ms. Shifflett says Ritalin made her feel a whole lot better.  She felt like she could focus a whole lot more.  Again, this is wonderful for her quality of life.  We did go ahead and do scans.  These were done on October 22nd.  She has continued shrinkage of her left lung mass.  It now measures 4.1 x 3.0 cm.  She has some pulmonary nodules which are quite small, which are unchanged in size.  She has stable sclerotic bone metastases.  Her last MRI was in early October.  This showed 2 new punctate lesions in the right hemisphere.  The area now is stable.  There were some punctate lesions in the thalamus bilaterally.  It was felt that since she was asymptomatic, that she would just be followed.  She has had no abdominal pain.  She has had no problems with bowels or bladder.  She has had no leg swelling.  There has been no rashes.  PHYSICAL EXAMINATION:  General:  This is a well-developed, well- nourished white female, in no obvious distress.  Vital signs: Temperature 97.9, pulse 93, respiratory rate 14, blood pressure 154/90, weight is 170 pounds.  Head and neck:  Normocephalic, atraumatic skull. There are no ocular or oral lesions.  There are no palpable cervical or supraclavicular lymph nodes.  Lungs:  Clear bilaterally.  Cardiac: Regular rate and rhythm with no murmurs, rubs,  or bruits.  Abdomen: Soft.  She has good bowel sounds.  There is no palpable abdominal mass. There is no fluid wave.  There is no palpable hepatosplenomegaly. Extremities:  No clubbing, cyanosis, or edema.  She has good range of motion of her joints.  Neurological:  No focal neurological deficits.  LABORATORY STUDIES:  White cell count is 5.1, hemoglobin 10.6, hematocrit 32.9, platelet count 314.  BUN 12, creatinine 1.1.  Calcium 9.2 with an albumin of 3.4.  IMPRESSION:  Ms. [name] is a 57 year old white female with metastatic adenocarcinoma of the lung.  She is EGFR positive.  Unfortunately, she did not respond to Tarceva or to Gilotrif.  As such, currently she is on chemotherapy, and she actually has responded to chemotherapy.  We will put her on maintenance now.  We will just give her Alimta.  We will plan to get her back in 3 weeks' time.  I reviewed all of her lab work with her.  Her breast cancer is shrinking.  The ultrasound that she had done of the right breast showed continued shrinkage of this mass.  It now measures 1 x 0.8 x 0.5 cm.  As such, we will just watch this for now.    ______________________________ Josph Macho, M.D. PRE/MEDQ  D:  11/27/2012  T:  12/07/2012  Job:  [id number]

## 2012-12-15 ENCOUNTER — Other Ambulatory Visit: Payer: Self-pay | Admitting: Hematology & Oncology

## 2012-12-18 ENCOUNTER — Other Ambulatory Visit (HOSPITAL_BASED_OUTPATIENT_CLINIC_OR_DEPARTMENT_OTHER): Payer: BC Managed Care – PPO | Admitting: Lab

## 2012-12-18 ENCOUNTER — Ambulatory Visit (HOSPITAL_BASED_OUTPATIENT_CLINIC_OR_DEPARTMENT_OTHER): Payer: BC Managed Care – PPO

## 2012-12-18 ENCOUNTER — Ambulatory Visit (HOSPITAL_BASED_OUTPATIENT_CLINIC_OR_DEPARTMENT_OTHER): Payer: BC Managed Care – PPO | Admitting: Hematology & Oncology

## 2012-12-18 DIAGNOSIS — C799 Secondary malignant neoplasm of unspecified site: Secondary | ICD-10-CM

## 2012-12-18 DIAGNOSIS — C50419 Malignant neoplasm of upper-outer quadrant of unspecified female breast: Secondary | ICD-10-CM

## 2012-12-18 DIAGNOSIS — C3430 Malignant neoplasm of lower lobe, unspecified bronchus or lung: Secondary | ICD-10-CM

## 2012-12-18 DIAGNOSIS — C7931 Secondary malignant neoplasm of brain: Secondary | ICD-10-CM

## 2012-12-18 DIAGNOSIS — C7951 Secondary malignant neoplasm of bone: Secondary | ICD-10-CM

## 2012-12-18 DIAGNOSIS — C3492 Malignant neoplasm of unspecified part of left bronchus or lung: Secondary | ICD-10-CM

## 2012-12-18 DIAGNOSIS — C3490 Malignant neoplasm of unspecified part of unspecified bronchus or lung: Secondary | ICD-10-CM

## 2012-12-18 DIAGNOSIS — Z5111 Encounter for antineoplastic chemotherapy: Secondary | ICD-10-CM

## 2012-12-18 LAB — CMP (CANCER CENTER ONLY)
Albumin: 3.4 g/dL (ref 3.3–5.5)
CO2: 28 mEq/L (ref 18–33)
Calcium: 9.5 mg/dL (ref 8.0–10.3)
Chloride: 99 mEq/L (ref 98–108)
Glucose, Bld: 108 mg/dL (ref 73–118)
Potassium: 4.2 mEq/L (ref 3.3–4.7)
Sodium: 144 mEq/L (ref 128–145)
Total Protein: 7.7 g/dL (ref 6.4–8.1)

## 2012-12-18 LAB — CBC WITH DIFFERENTIAL (CANCER CENTER ONLY)
Eosinophils Absolute: 0 10*3/uL (ref 0.0–0.5)
MCV: 105 fL — ABNORMAL HIGH (ref 81–101)
MONO#: 1.3 10*3/uL — ABNORMAL HIGH (ref 0.1–0.9)
NEUT#: 6.3 10*3/uL (ref 1.5–6.5)
Platelets: 492 10*3/uL — ABNORMAL HIGH (ref 145–400)
RBC: 3.2 10*6/uL — ABNORMAL LOW (ref 3.70–5.32)
WBC: 8.5 10*3/uL (ref 3.9–10.0)

## 2012-12-18 MED ORDER — ALTEPLASE 2 MG IJ SOLR
2.0000 mg | Freq: Once | INTRAMUSCULAR | Status: DC | PRN
  Filled 2012-12-18: qty 2

## 2012-12-18 MED ORDER — SODIUM CHLORIDE 0.9 % IJ SOLN
3.0000 mL | INTRAMUSCULAR | Status: DC | PRN
  Filled 2012-12-18: qty 10

## 2012-12-18 MED ORDER — ONDANSETRON 8 MG/50ML IVPB (CHCC)
8.0000 mg | Freq: Once | INTRAVENOUS | Status: AC
  Administered 2012-12-18: 8 mg via INTRAVENOUS

## 2012-12-18 MED ORDER — HEPARIN SOD (PORK) LOCK FLUSH 100 UNIT/ML IV SOLN
500.0000 [IU] | Freq: Once | INTRAVENOUS | Status: AC | PRN
  Administered 2012-12-18: 500 [IU]
  Filled 2012-12-18: qty 5

## 2012-12-18 MED ORDER — ZOLEDRONIC ACID 4 MG/100ML IV SOLN
4.0000 mg | Freq: Once | INTRAVENOUS | Status: AC
  Administered 2012-12-18: 4 mg via INTRAVENOUS
  Filled 2012-12-18: qty 100

## 2012-12-18 MED ORDER — DEXAMETHASONE SODIUM PHOSPHATE 10 MG/ML IJ SOLN
INTRAMUSCULAR | Status: AC
  Filled 2012-12-18: qty 1

## 2012-12-18 MED ORDER — DEXAMETHASONE SODIUM PHOSPHATE 10 MG/ML IJ SOLN
10.0000 mg | Freq: Once | INTRAMUSCULAR | Status: AC
  Administered 2012-12-18: 10 mg via INTRAVENOUS

## 2012-12-18 MED ORDER — SODIUM CHLORIDE 0.9 % IV SOLN
Freq: Once | INTRAVENOUS | Status: AC
  Administered 2012-12-18: 11:00:00 via INTRAVENOUS

## 2012-12-18 MED ORDER — SODIUM CHLORIDE 0.9 % IJ SOLN
10.0000 mL | INTRAMUSCULAR | Status: DC | PRN
  Administered 2012-12-18: 10 mL
  Filled 2012-12-18: qty 10

## 2012-12-18 MED ORDER — HEPARIN SOD (PORK) LOCK FLUSH 100 UNIT/ML IV SOLN
250.0000 [IU] | Freq: Once | INTRAVENOUS | Status: DC | PRN
  Filled 2012-12-18: qty 5

## 2012-12-18 MED ORDER — SODIUM CHLORIDE 0.9 % IV SOLN
500.0000 mg/m2 | Freq: Once | INTRAVENOUS | Status: AC
  Administered 2012-12-18: 925 mg via INTRAVENOUS
  Filled 2012-12-18: qty 37

## 2012-12-18 NOTE — Progress Notes (Signed)
This office note has been dictated.

## 2012-12-18 NOTE — Patient Instructions (Signed)
[facility] Discharge Instructions for Patients Receiving Chemotherapy  Today you received chemotherapy agents. Alimta and Zometa  To help prevent nausea and vomiting after your treatment, we encourage you to take your nausea medication as prescribed. If you develop nausea and vomiting that is not controlled by your nausea medication, call the clinic. If it is after clinic hours your family physician or the after hours number for the clinic or go to the Emergency Department.   BELOW ARE SYMPTOMS THAT SHOULD BE REPORTED IMMEDIATELY:  *FEVER GREATER THAN 100.5 F  *CHILLS WITH OR WITHOUT FEVER  NAUSEA AND VOMITING THAT IS NOT CONTROLLED WITH YOUR NAUSEA MEDICATION  *UNUSUAL SHORTNESS OF BREATH  *UNUSUAL BRUISING OR BLEEDING  TENDERNESS IN MOUTH AND THROAT WITH OR WITHOUT PRESENCE OF ULCERS  *URINARY PROBLEMS  *BOWEL PROBLEMS  UNUSUAL RASH Items with * indicate a potential emergency and should be followed up as soon as possible.   Please let the nurse know about any problems that you may have experienced. Feel free to call the clinic you have any questions or concerns. The clinic phone number is [phone].   I have been informed and understand all the instructions given to me. I know to contact the clinic, my physician, or go to the Emergency Department if any problems should occur. I do not have any questions at this time, but understand that I may call the clinic during office hours   should I have any questions or need assistance in obtaining follow up care.    __________________________________________  _____________  __________ Signature of Patient or Authorized Representative            Date                   Time    __________________________________________ Nurse's Signature   Pemetrexed injection What is this medicine? PEMETREXED (PEM e TREX ed) is a chemotherapy drug. This medicine affects cells that are rapidly growing, such as cancer cells  and cells in your mouth and stomach. It is usually used to treat lung cancers like non-small cell lung cancer and mesothelioma. It may also be used to treat other cancers. This medicine may be used for other purposes; ask your health care provider or pharmacist if you have questions. COMMON BRAND NAME(S): Alimta What should I tell my health care provider before I take this medicine? They need to know if you have any of these conditions: -if you frequently drink alcohol containing beverages -infection (especially a virus infection such as chickenpox, cold sores, or herpes) -kidney disease -liver disease -low blood counts, like low platelets, red bloods, or white blood cells -an unusual or allergic reaction to pemetrexed, mannitol, other medicines, foods, dyes, or preservatives -pregnant or trying to get pregnant -breast-feeding How should I use this medicine? This drug is given as an infusion into a vein. It is administered in a hospital or clinic by a specially trained health care professional. Talk to your pediatrician regarding the use of this medicine in children. Special care may be needed. Overdosage: If you think you have taken too much of this medicine contact a poison control center or emergency room at once. NOTE: This medicine is only for you. Do not share this medicine with others. What if I miss a dose? It is important not to miss your dose. Call your doctor or health care professional if you are unable to keep an appointment. What may interact with this medicine? -aspirin and aspirin-like medicines -  medicines to increase blood counts like filgrastim, pegfilgrastim, sargramostim -methotrexate -NSAIDS, medicines for pain and inflammation, like ibuprofen or naproxen -probenecid -pyrimethamine -vaccines Talk to your doctor or health care professional before taking any of these medicines: -acetaminophen -aspirin -ibuprofen -ketoprofen -naproxen This list may not describe all  possible interactions. Give your health care provider a list of all the medicines, herbs, non-prescription drugs, or dietary supplements you use. Also tell them if you smoke, drink alcohol, or use illegal drugs. Some items may interact with your medicine. What should I watch for while using this medicine? Visit your doctor for checks on your progress. This drug may make you feel generally unwell. This is not uncommon, as chemotherapy can affect healthy cells as well as cancer cells. Report any side effects. Continue your course of treatment even though you feel ill unless your doctor tells you to stop. In some cases, you may be given additional medicines to help with side effects. Follow all directions for their use. Call your doctor or health care professional for advice if you get a fever, chills or sore throat, or other symptoms of a cold or flu. Do not treat yourself. This drug decreases your body's ability to fight infections. Try to avoid being around people who are sick. This medicine may increase your risk to bruise or bleed. Call your doctor or health care professional if you notice any unusual bleeding. Be careful brushing and flossing your teeth or using a toothpick because you may get an infection or bleed more easily. If you have any dental work done, tell your dentist you are receiving this medicine. Avoid taking products that contain aspirin, acetaminophen, ibuprofen, naproxen, or ketoprofen unless instructed by your doctor. These medicines may hide a fever. Call your doctor or health care professional if you get diarrhea or mouth sores. Do not treat yourself. To protect your kidneys, drink water or other fluids as directed while you are taking this medicine. Men and women must use effective birth control while taking this medicine. You may also need to continue using effective birth control for a time after stopping this medicine. Do not become pregnant while taking this medicine. Tell your  doctor right away if you think that you or your partner might be pregnant. There is a potential for serious side effects to an unborn child. Talk to your health care professional or pharmacist for more information. Do not breast-feed an infant while taking this medicine. This medicine may lower sperm counts. What side effects may I notice from receiving this medicine? Side effects that you should report to your doctor or health care professional as soon as possible: -allergic reactions like skin rash, itching or hives, swelling of the face, lips, or tongue -low blood counts - this medicine may decrease the number of white blood cells, red blood cells and platelets. You may be at increased risk for infections and bleeding. -signs of infection - fever or chills, cough, sore throat, pain or difficulty passing urine -signs of decreased platelets or bleeding - bruising, pinpoint red spots on the skin, black, tarry stools, blood in the urine -signs of decreased red blood cells - unusually weak or tired, fainting spells, lightheadedness -breathing problems, like a dry cough -changes in emotions or moods -chest pain -confusion -diarrhea -high blood pressure -mouth or throat sores or ulcers -pain, swelling, warmth in the leg -pain on swallowing -swelling of the ankles, feet, hands -trouble passing urine or change in the amount of urine -vomiting -yellowing of the   eyes or skin Side effects that usually do not require medical attention (report to your doctor or health care professional if they continue or are bothersome): -hair loss -loss of appetite -nausea -stomach upset This list may not describe all possible side effects. Call your doctor for medical advice about side effects. You may report side effects to FDA at 1-800-FDA-1088. Where should I keep my medicine? This drug is given in a hospital or clinic and will not be stored at home. NOTE: This sheet is a summary. It may not cover all  possible information. If you have questions about this medicine, talk to your doctor, pharmacist, or health care provider.  2014, Elsevier/Gold Standard. (2007-08-06 13:24:03) Zoledronic Acid injection (Hypercalcemia, Oncology) What is this medicine? ZOLEDRONIC ACID (ZOE le dron ik AS id) lowers the amount of calcium loss from bone. It is used to treat too much calcium in your blood from cancer. It is also used to prevent complications of cancer that has spread to the bone. This medicine may be used for other purposes; ask your health care provider or pharmacist if you have questions. COMMON BRAND NAME(S): Zometa What should I tell my health care provider before I take this medicine? They need to know if you have any of these conditions: -aspirin-sensitive asthma -cancer, especially if you are receiving medicines used to treat cancer -dental disease or wear dentures -infection -kidney disease -receiving corticosteroids like dexamethasone or prednisone -an unusual or allergic reaction to zoledronic acid, other medicines, foods, dyes, or preservatives -pregnant or trying to get pregnant -breast-feeding How should I use this medicine? This medicine is for infusion into a vein. It is given by a health care professional in a hospital or clinic setting. Talk to your pediatrician regarding the use of this medicine in children. Special care may be needed. Overdosage: If you think you have taken too much of this medicine contact a poison control center or emergency room at once. NOTE: This medicine is only for you. Do not share this medicine with others. What if I miss a dose? It is important not to miss your dose. Call your doctor or health care professional if you are unable to keep an appointment. What may interact with this medicine? -certain antibiotics given by injection -NSAIDs, medicines for pain and inflammation, like ibuprofen or naproxen -some diuretics like bumetanide,  furosemide -teriparatide -thalidomide This list may not describe all possible interactions. Give your health care provider a list of all the medicines, herbs, non-prescription drugs, or dietary supplements you use. Also tell them if you smoke, drink alcohol, or use illegal drugs. Some items may interact with your medicine. What should I watch for while using this medicine? Visit your doctor or health care professional for regular checkups. It may be some time before you see the benefit from this medicine. Do not stop taking your medicine unless your doctor tells you to. Your doctor may order blood tests or other tests to see how you are doing. Women should inform their doctor if they wish to become pregnant or think they might be pregnant. There is a potential for serious side effects to an unborn child. Talk to your health care professional or pharmacist for more information. You should make sure that you get enough calcium and vitamin D while you are taking this medicine. Discuss the foods you eat and the vitamins you take with your health care professional. Some people who take this medicine have severe bone, joint, and/or muscle pain. This medicine may   also increase your risk for jaw problems or a broken thigh bone. Tell your doctor right away if you have severe pain in your jaw, bones, joints, or muscles. Tell your doctor if you have any pain that does not go away or that gets worse. Tell your dentist and dental surgeon that you are taking this medicine. You should not have major dental surgery while on this medicine. See your dentist to have a dental exam and fix any dental problems before starting this medicine. Take good care of your teeth while on this medicine. Make sure you see your dentist for regular follow-up appointments. What side effects may I notice from receiving this medicine? Side effects that you should report to your doctor or health care professional as soon as possible: -allergic  reactions like skin rash, itching or hives, swelling of the face, lips, or tongue -anxiety, confusion, or depression -breathing problems -changes in vision -eye pain -feeling faint or lightheaded, falls -jaw pain, especially after dental work -mouth sores -muscle cramps, stiffness, or weakness -trouble passing urine or change in the amount of urine Side effects that usually do not require medical attention (report to your doctor or health care professional if they continue or are bothersome): -bone, joint, or muscle pain -constipation -diarrhea -fever -hair loss -irritation at site where injected -loss of appetite -nausea, vomiting -stomach upset -trouble sleeping -trouble swallowing -weak or tired This list may not describe all possible side effects. Call your doctor for medical advice about side effects. You may report side effects to FDA at 1-800-FDA-1088. Where should I keep my medicine? This drug is given in a hospital or clinic and will not be stored at home. NOTE: This sheet is a summary. It may not cover all possible information. If you have questions about this medicine, talk to your doctor, pharmacist, or health care provider.  2014, Elsevier/Gold Standard. (2012-06-13 13:03:13)  

## 2012-12-20 ENCOUNTER — Encounter: Payer: Self-pay | Admitting: Radiation Oncology

## 2012-12-20 ENCOUNTER — Ambulatory Visit
Admission: RE | Admit: 2012-12-20 | Discharge: 2012-12-20 | Disposition: A | Discharge status: Home / Self Care | Payer: BC Managed Care – PPO | Source: Ambulatory Visit | Attending: Radiation Oncology | Admitting: Radiation Oncology

## 2012-12-20 DIAGNOSIS — C7931 Secondary malignant neoplasm of brain: Secondary | ICD-10-CM

## 2012-12-20 MED ORDER — GADOBENATE DIMEGLUMINE 529 MG/ML IV SOLN
16.0000 mL | Freq: Once | INTRAVENOUS | Status: AC | PRN
  Administered 2012-12-20: 16 mL via INTRAVENOUS

## 2012-12-23 ENCOUNTER — Ambulatory Visit
Admission: RE | Admit: 2012-12-23 | Discharge: 2012-12-23 | Disposition: A | Discharge status: Home / Self Care | Payer: BC Managed Care – PPO | Source: Ambulatory Visit | Attending: Radiation Oncology | Admitting: Radiation Oncology

## 2012-12-23 ENCOUNTER — Ambulatory Visit
Admission: RE | Admit: 2012-12-23 | Discharge: 2012-12-23 | Disposition: A | Discharge status: Home / Self Care | Payer: BC Managed Care – PPO | Source: Ambulatory Visit | Attending: Neurosurgery | Admitting: Neurosurgery

## 2012-12-23 ENCOUNTER — Ambulatory Visit: Payer: Self-pay | Admitting: Nurse Practitioner

## 2012-12-23 ENCOUNTER — Encounter: Payer: Self-pay | Admitting: Radiation Oncology

## 2012-12-23 VITALS — BP 150/98 | HR 128 | Temp 98.3°F | Resp 20 | Wt 166.2 lb

## 2012-12-23 DIAGNOSIS — C7931 Secondary malignant neoplasm of brain: Secondary | ICD-10-CM

## 2012-12-23 NOTE — Progress Notes (Signed)
Follow up [facility] brain,  Here fro MRI done 12/20/12, patient fatuigued and nauseated since chemotherapy last Wednesday, takes Zometa q 3o days, on Decadron per chemo regimen, and aromasin , no c/o pain, blurred vision or head aches, eating pretty good stated patient 8:07 AM

## 2012-12-23 NOTE — Progress Notes (Signed)
CC:   [name], M.D.  DIAGNOSES: 1. Metastatic adenocarcinoma of the lung, epidermal growth factor     receptor positive. 2. Invasive lobular carcinoma of the right breast.  CURRENT THERAPY: 1. Maintenance Alimta. 2. Aromasin 25 mg p.o. daily. 3. Zometa 4 mg IV q.3 weeks.  INTERIM HISTORY:  [name] comes in for a followup.  She is doing fairly well.  She had her first cycle of maintenance Alimta 3 weeks ago. She tolerated this well.  She has had no problems with cough or shortness of breath.  She still has some fatigue.  She is trying to work full-time.  She has had no leg swelling.  She has had no rashes.  There is no headache.  I think she is going for an MRI of the brain later on this week.  This is being followed by Radiation Oncology.  Her appetite remains good.  What really has helped her probably more than a lot of things has been Ritalin.  She says that she really is more functional with this.  PHYSICAL EXAMINATION:  General:  This is a well-developed, well- nourished white female, in no obvious distress.  Vital Signs: Temperature of 97.9, pulse 87, respiratory rate 14, blood pressure 171/98.  Weight is 170 pounds.  Head and Neck:  Normocephalic, atraumatic skull.  There are no ocular or oral lesions.  There are no palpable cervical or supraclavicular lymph nodes.  Lungs:  Clear bilaterally.  Shows no rales, wheezes, or rhonchi.  Cardiac:  Regular rate and rhythm with a normal S1 and S2.  There are no murmurs, rubs, or bruits.  Abdomen:  Soft.  She has good bowel sounds.  There is no fluid wave.  There is no palpable abdominal mass.  There is no palpable hepatosplenomegaly.  Back:  No tenderness over the spine, ribs, or hips. Extremities:  No clubbing, cyanosis, or edema.  She has good range motion of her joints.  She has good muscle strength in upper and lower extremities.  Skin:  No rashes, ecchymoses, or petechia.  Neurological: No focal neurological  deficits.  LABORATORY STUDIES:  White cell count is 8.5, hemoglobin 10.6, hematocrit 33.7, platelet count 492.  BUN 18, creatinine 1.2.  Calcium 9.5 with an albumin of 3.4.  IMPRESSION:  Ms. Gossett is a very charming 57 year old white female with metastatic adenocarcinoma of the lung.  Despite her being epidermal growth factor receptor positive, she had very little response to targeted therapy with Tarceva or Gilotrif.  As such, we got her on chemotherapy and she has had a very nice response to chemotherapy.  Hopefully, maintenance chemotherapy will help keep her malignancy at a stable level.  Her quality of life is quite good.  Her functional status is good with an ECOG performance status of ECOG 1.  She has a very good pain control.  She has had radiation and kyphoplasty to the spine.  We will go ahead and plan to get her back in another 3 weeks.  We will try to work around the Christmas holidays so that she can enjoy being with her family.    ______________________________ Josph Macho, M.D. PRE/MEDQ  D:  12/18/2012  T:  12/22/2012  Job:  [id number]

## 2012-12-23 NOTE — Progress Notes (Signed)
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   Narrative:  The patient returns today for routine follow-up.  The patient states that she is doing well overall today. She denies any significant headaches or changes in vision. No nausea with the patient indicating that she has had a good appetite. She had a recent MRI scan of the brain on 12/21/1998 413 and she presents today for discussion of this.                              ALLERGIES:  has No Known Allergies.  Meds: Current Outpatient Prescriptions  Medication Sig Dispense Refill  . ALPRAZolam (XANAX) 0.25 MG tablet Take 0.25-0.5 mg by mouth every 8 (eight) hours as needed. For anxiety.      Marland Kitchen dexamethasone (DECADRON) 4 MG tablet Take 1 tablet (4 mg total) by mouth 2 (two) times daily with a meal. As directed with Chemo  60 tablet  4  . exemestane (AROMASIN) 25 MG tablet Take 1 tablet (25 mg total) by mouth daily after breakfast.  30 tablet  6  . folic acid (FOLVITE) 1 MG tablet Take 1 mg by mouth daily.      Marland Kitchen HYDROcodone-acetaminophen (NORCO) 10-325 MG per tablet Take 1 tablet by mouth every 6 (six) hours as needed for pain.      Marland Kitchen ibuprofen (ADVIL,MOTRIN) 200 MG tablet Take 600 mg by mouth every 6 (six) hours as needed for pain.       Marland Kitchen lidocaine-prilocaine (EMLA) cream Apply topically as needed.       . loperamide (IMODIUM) 2 MG capsule Take 2 mg by mouth 4 (four) times daily as needed. For diarrhea      . loratadine (CLARITIN) 10 MG tablet Take 10 mg by mouth daily.      Marland Kitchen LORazepam (ATIVAN) 0.5 MG tablet Take 1 tablet (0.5 mg total) by mouth every 6 (six) hours as needed  for anxiety.  60 tablet  3  . methylphenidate (RITALIN) 5 MG tablet Take 5 mg by mouth every morning. Take after chemo if needed      . naphazoline (NAPHCON) 0.1 % ophthalmic solution Place 1 drop into both eyes 4 (four) times daily as needed.  15 mL  2  . ondansetron (ZOFRAN) 8 MG tablet Take 1 tablet (8 mg total) by mouth every 12 (twelve) hours as needed.  20 tablet  4  . Probiotic Product (ALIGN) 4 MG CAPS Take by mouth every morning.      . prochlorperazine (COMPAZINE) 10 MG tablet TAKE 1 TABLET (10 MG TOTAL) BY MOUTH AS NEEDED.  30 tablet  3  . zolendronic acid (ZOMETA) 4 MG/5ML injection Inject 4 mg into the vein every 30 (thirty) days.       No current facility-administered medications for this encounter.   Facility-Administered Medications Ordered in Other Encounters  Medication Dose Route Frequency Provider Last Rate Last Dose  . sodium chloride 0.9 % injection 10 mL  10 mL Intracatheter PRN Josph Macho, MD   10 mL at 09/04/12 1511    Physical  Findings: The patient is in no acute distress. Patient is alert and oriented.  weight is 166 lb 3.2 oz (75.388 kg). Her oral temperature is 98.3 F (36.8 C). Her blood pressure is 150/98 and her pulse is 128. Her respiration is 20. .     Lab Findings: Lab Results  Component Value Date   WBC 8.5 12/18/2012   HGB 10.6* 12/18/2012   HCT 33.7* 12/18/2012   MCV 105* 12/18/2012   PLT 492* 12/18/2012     Radiographic Findings: Mr Laqueta Jean ZO Contrast  12/20/2012   CLINICAL DATA:  Lung cancer.  Brain metastases.  Restaging.  EXAM: MRI HEAD WITHOUT AND WITH CONTRAST  TECHNIQUE: Multiplanar, multiecho pulse sequences of the brain and surrounding structures were obtained without and with intravenous contrast.  CONTRAST:  16mL MULTIHANCE GADOBENATE DIMEGLUMINE 529 MG/ML IV SOLN  COMPARISON:  MRI brain without and with contrast 10/18/2012.  FINDINGS: A lesion in the medial left temporal lobe has increased in size since prior exam, now measuring 7.5  x 6.0 x 6.0 cm.  A right cerebellar lesion is slightly increased in size at 3.8 mm.  A lesion in the high right frontal lobe on image 137 of series 10 has increased in size, now measuring 3.8 mm. Restricted diffusion is present in this lesion as well. This can be seen in the setting of small cell lung cancer metastases.  A new lesion is present in the right temporal lobe on image 62 of series 10, measuring 2.7 mm.  Punctate lesions in the parietal lobes bilaterally are stable. A punctate area of enhancement in the right thalamus is stable.  No acute infarct or hemorrhage is present. The midline structures are within normal limits.  Flow is present in the major intracranial arteries. Mild periventricular white matter changes are similar to the prior exam. The ventricles are within normal limits. No significant extra-axial fluid collection is present. The globes and orbits are intact. The paranasal sinuses and mastoid air cells are clear.  IMPRESSION: 1. Increasing size of medial left temporal lobe lesion, now measuring 7.5 mm maximally. 2. Slight increase in size of a right cerebellar lesion. 3. Slight increase in size of a high anterior right frontal lobe lesion. There is restricted diffusion within this lesion is well. 4. New 2.7 mm right temporal lobe lesion is compatible with a focal metastasis. 5. Least 3 other punctate lesions are stable. 6. Stable white matter changes. These likely reflect a combination of small vessel disease and prior radiation therapy.   Electronically Signed   By: Gennette Pac M.D.   On: 12/20/2012 15:13    Impression:    The patient unfortunately has several findings on the MRI scan. Specifically she has 4 areas intracranially which either or new or had increased slightly. It was felt that these 4 lesions likely would be appropriate target for an additional course of radiosurgery. The patient has had 2 previous courses of radiosurgery and we will work with the physics staff to ensure  that none of these areas have been previously treated. It was not clear today in conference that this was the case.  We discussed this finding with the patient. We discussed the potential benefit once again of a course of radiosurgery. All of her questions were answered and she does wish to proceed with this treatment plan. She does understand the possible side effects and risks of treatment.  Plan:  The patient will be scheduled for a simulation in the near future such  that we can proceed with treatment planning. I am planning to go ahead and contour the for target area is from the recent MRI scan and this will be compared to her previous treatments.  I spent 15 minutes with the patient today, the majority of which was spent counseling the patient on the diagnosis of cancer and coordinating care.   Radene Gunning, M.D., Ph.D.

## 2012-12-25 ENCOUNTER — Ambulatory Visit
Admission: RE | Admit: 2012-12-25 | Discharge: 2012-12-25 | Disposition: A | Discharge status: Home / Self Care | Payer: BC Managed Care – PPO | Source: Ambulatory Visit | Attending: Radiation Oncology | Admitting: Radiation Oncology

## 2012-12-25 ENCOUNTER — Telehealth: Payer: Self-pay | Admitting: *Deleted

## 2012-12-25 VITALS — BP 148/96 | HR 100 | Temp 98.8°F | Resp 20 | Wt 165.9 lb

## 2012-12-25 DIAGNOSIS — Z510 Encounter for antineoplastic radiation therapy: Secondary | ICD-10-CM | POA: Insufficient documentation

## 2012-12-25 DIAGNOSIS — C7931 Secondary malignant neoplasm of brain: Secondary | ICD-10-CM

## 2012-12-25 DIAGNOSIS — C3490 Malignant neoplasm of unspecified part of unspecified bronchus or lung: Secondary | ICD-10-CM | POA: Insufficient documentation

## 2012-12-25 MED ORDER — SODIUM CHLORIDE 0.9 % IJ SOLN
10.0000 mL | Freq: Once | INTRAMUSCULAR | Status: AC
  Administered 2012-12-25: 10 mL via INTRAVENOUS

## 2012-12-25 NOTE — Progress Notes (Signed)
D/c IV 2x2 gauase over site and taped,  D/c patient home [id number]

## 2012-12-25 NOTE — Progress Notes (Signed)
patint arrived gave name abnd dob as identification, has a power port a cath right subclavian, labs bun=18, CR=1.2 on12/3/14: per protocol using sterile technique accessed right port with 20 g 1 in huber needle, flushed with 10 ml ns easily but no blood return, asked patient to cough hold up arms, still no blood return when flushing with normal saline, deacessed port asked samantha RN to try, patient denied pain, nausea, dizzyness , not diabetic not allergy to anything, no IV dye allergy stated vitals  Done, meds updated, samantha presnell rn also acccessed right port and still no blood return , will attempt 1 more try, still unable to get blood return from port, tried standing patient, coughing , , so started LAC x 1 attempt 22g g 1 inch catheter needle with excellent blood return,flushed with 10 ml normal saline,pateint tolerated well, apologized to the patient that we couldn't obtain a blood return  And will call Dr.ennever's office and let his nurse know and if they want her to come in before her next chemotherapy they will call her,patient thanked me for calling his office and letting them know .  Called CT sim and spoke with Lenore RT patient is ready for ct simulation 9:12 AM

## 2012-12-25 NOTE — Telephone Encounter (Signed)
Called Dr.ennever's office [phone] with his nurse Dwyane Dee, to inform unsuccessful blood return from her right  porta a cath  After 3 attempts from 2 different RN's,  Port flushed well each time but no blood return even having patient cough, stand up arms up , no avail, so had to start a peripheral in her LAC, for ct simulation, this is a FYI in case you need to call her in to check,patient  Said to call and leave a message if not home,  9:31 AM

## 2012-12-30 ENCOUNTER — Encounter: Payer: Self-pay | Admitting: Radiation Oncology

## 2012-12-30 ENCOUNTER — Ambulatory Visit
Admission: RE | Admit: 2012-12-30 | Discharge: 2012-12-30 | Disposition: A | Discharge status: Home / Self Care | Payer: BC Managed Care – PPO | Source: Ambulatory Visit | Attending: Radiation Oncology | Admitting: Radiation Oncology

## 2012-12-30 VITALS — BP 158/95 | HR 99 | Temp 98.2°F

## 2012-12-30 DIAGNOSIS — C7931 Secondary malignant neoplasm of brain: Secondary | ICD-10-CM

## 2012-12-30 NOTE — Progress Notes (Signed)
Mrs. [name] has received SAR brain irradiation today.  She is up in a chair presently and denies any headache, ataxia, or blurred vision.  BP elevated presently, as was the case last week, s/p this same procedure.  Accompanied by her husband

## 2013-01-02 DIAGNOSIS — B029 Zoster without complications: Secondary | ICD-10-CM

## 2013-01-02 HISTORY — DX: Zoster without complications: B02.9

## 2013-01-02 NOTE — Progress Notes (Signed)
  Radiation Oncology         [phone] ________________________________  Name: [name] MRN: [id number]  Date: 12/30/2012  DOB: 1956/01/14   SPECIAL TREATMENT PROCEDURE   3D TREATMENT PLANNING AND DOSIMETRY: The patient's radiation plan was reviewed and approved by Dr. Venetia Maxon from neurosurgery and radiation oncology prior to treatment. It showed 3-dimensional radiation distributions overlaid onto the planning CT/MRI image set. The [facility] for the target structures as well as the organs at risk were reviewed. The documentation of the 3D plan and dosimetry are filed in the radiation oncology EMR.   NARRATIVE: The patient was brought to the TrueBeam stereotactic radiation treatment machine and placed supine on the CT couch. The head frame was applied, and the patient was set up for stereotactic radiosurgery. Neurosurgery was present for the set-up and delivery   SIMULATION VERIFICATION: In the couch zero-angle position, the patient underwent Exactrac imaging using the Brainlab system with orthogonal KV images. These were carefully aligned and repeated to confirm treatment position for each of the isocenters. The Exactrac snap film verification was repeated at each couch angle.   SPECIAL TREATMENT PROCEDURE: The patient received stereotactic radiosurgery to the following targets:  1.  Right frontal 3.8 mm target was treated using 3 circular cone Arcs to a prescription dose of 20 Gy. ExacTrac Snap verification was performed for each couch angle.  2.  Right temporal 2.7 mm target was treated using 3 circular cone Arcs to a prescription dose of 20 Gy. ExacTrac Snap verification was performed for each couch angle.   STEREOTACTIC TREATMENT MANAGEMENT: Following delivery, the patient was transported to nursing in stable condition and monitored for possible acute effects. Vital signs were recorded . The patient tolerated treatment without significant acute effects, and was discharged to home in  stable condition.  PLAN: Follow-up in one month.   ------------------------------------------------  Radene Gunning, MD, PhD

## 2013-01-02 NOTE — Progress Notes (Signed)
  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  Right frontal 3.8 mm target was treated using 3 circular cone Arcs to a prescription dose of 20 Gy. ExacTrac Snap verification was performed for each couch angle.  2.  Right temporal 2.7 mm target was treated using 3 circular cone Arcs to a prescription dose of 20 Gy. ExacTrac Snap verification was performed for each couch angle.   Of note, we had discussed possible treatment to 4 lesions in the brain conference. 2 of the 4 lesions had been treated previously. These are small lesions with a slight increase and these therefore will be followed on future scans.  Narrative: The patient tolerated radiation treatment relatively well.   The patient did not display any acute toxicity after treatment.  Plan: The patient has completed radiation treatment. The patient will return to radiation oncology clinic for routine followup in one month. I advised the patient to call or return sooner if they have any questions or concerns related to their recovery or treatment. ________________________________  Radene Gunning, M.D., Ph.D.

## 2013-01-02 NOTE — Progress Notes (Signed)
  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  Identity was confirmed.  All relevant records and images related to the planned course of therapy were reviewed.  The patient freely provided informed written consent to proceed with treatment after reviewing the details related to the planned course of therapy. The consent form was witnessed and verified by the simulation staff. Intravenous access was established for contrast administration. Then, the patient was set-up in a stable reproducible supine position for radiation therapy.  A relocatable thermoplastic stereotactic head frame was fabricated for precise immobilization.  CT images were obtained.  Surface markings were placed.  The CT images were loaded into the planning software and fused with the patient's targeting MRI scan.  Then the target and avoidance structures were contoured.  Treatment planning then occurred.  The radiation prescription was entered and confirmed.  I have requested 3D planning  I have requested a DVH of the following structures: Brain stem, brain, left eye, right I, lenses, optic chiasm, target volumes, uninvolved brain, and normal tissue.    PLAN:  The patient will receive 20 Gy in 1 fraction to 2 intracranial lesions .  ________________________________  Radene Gunning, MD, PhD

## 2013-01-08 ENCOUNTER — Ambulatory Visit: Payer: BC Managed Care – PPO | Admitting: Lab

## 2013-01-08 ENCOUNTER — Ambulatory Visit (HOSPITAL_BASED_OUTPATIENT_CLINIC_OR_DEPARTMENT_OTHER): Payer: BC Managed Care – PPO | Admitting: Hematology & Oncology

## 2013-01-08 ENCOUNTER — Ambulatory Visit (HOSPITAL_BASED_OUTPATIENT_CLINIC_OR_DEPARTMENT_OTHER): Payer: BC Managed Care – PPO

## 2013-01-08 ENCOUNTER — Other Ambulatory Visit (HOSPITAL_BASED_OUTPATIENT_CLINIC_OR_DEPARTMENT_OTHER): Payer: BC Managed Care – PPO

## 2013-01-08 VITALS — BP 150/95 | HR 111 | Temp 97.7°F | Resp 18

## 2013-01-08 DIAGNOSIS — C3430 Malignant neoplasm of lower lobe, unspecified bronchus or lung: Secondary | ICD-10-CM

## 2013-01-08 DIAGNOSIS — C7931 Secondary malignant neoplasm of brain: Secondary | ICD-10-CM

## 2013-01-08 DIAGNOSIS — C50419 Malignant neoplasm of upper-outer quadrant of unspecified female breast: Secondary | ICD-10-CM

## 2013-01-08 DIAGNOSIS — C7951 Secondary malignant neoplasm of bone: Secondary | ICD-10-CM

## 2013-01-08 DIAGNOSIS — C3492 Malignant neoplasm of unspecified part of left bronchus or lung: Secondary | ICD-10-CM

## 2013-01-08 DIAGNOSIS — C799 Secondary malignant neoplasm of unspecified site: Secondary | ICD-10-CM

## 2013-01-08 DIAGNOSIS — B029 Zoster without complications: Secondary | ICD-10-CM

## 2013-01-08 DIAGNOSIS — Z5111 Encounter for antineoplastic chemotherapy: Secondary | ICD-10-CM

## 2013-01-08 LAB — CBC WITH DIFFERENTIAL (CANCER CENTER ONLY)
BASO#: 0 10*3/uL (ref 0.0–0.2)
Eosinophils Absolute: 0 10*3/uL (ref 0.0–0.5)
HCT: 33.9 % — ABNORMAL LOW (ref 34.8–46.6)
HGB: 10.6 g/dL — ABNORMAL LOW (ref 11.6–15.9)
LYMPH#: 0.9 10*3/uL (ref 0.9–3.3)
MCH: 32.7 pg (ref 26.0–34.0)
NEUT#: 4.3 10*3/uL (ref 1.5–6.5)
Platelets: 442 10*3/uL — ABNORMAL HIGH (ref 145–400)
RBC: 3.24 10*6/uL — ABNORMAL LOW (ref 3.70–5.32)
WBC: 5.7 10*3/uL (ref 3.9–10.0)

## 2013-01-08 LAB — CMP (CANCER CENTER ONLY)
ALT(SGPT): 20 U/L (ref 10–47)
AST: 33 U/L (ref 11–38)
Albumin: 3.7 g/dL (ref 3.3–5.5)
Alkaline Phosphatase: 138 U/L — ABNORMAL HIGH (ref 26–84)
BUN, Bld: 13 mg/dL (ref 7–22)
Calcium: 9.4 mg/dL (ref 8.0–10.3)
Chloride: 103 mEq/L (ref 98–108)
Glucose, Bld: 144 mg/dL — ABNORMAL HIGH (ref 73–118)
Potassium: 4.7 mEq/L (ref 3.3–4.7)
Sodium: 143 mEq/L (ref 128–145)
Total Protein: 8 g/dL (ref 6.4–8.1)

## 2013-01-08 MED ORDER — HEPARIN SOD (PORK) LOCK FLUSH 100 UNIT/ML IV SOLN
500.0000 [IU] | Freq: Once | INTRAVENOUS | Status: AC | PRN
  Administered 2013-01-08: 500 [IU]
  Filled 2013-01-08: qty 5

## 2013-01-08 MED ORDER — SODIUM CHLORIDE 0.9 % IV SOLN
500.0000 mg/m2 | Freq: Once | INTRAVENOUS | Status: AC
  Administered 2013-01-08: 925 mg via INTRAVENOUS
  Filled 2013-01-08: qty 37

## 2013-01-08 MED ORDER — METHYLPHENIDATE HCL 5 MG PO TABS: ORAL_TABLET | ORAL | Status: DC

## 2013-01-08 MED ORDER — DEXAMETHASONE SODIUM PHOSPHATE 10 MG/ML IJ SOLN
10.0000 mg | Freq: Once | INTRAMUSCULAR | Status: AC
  Administered 2013-01-08: 10 mg via INTRAVENOUS

## 2013-01-08 MED ORDER — DEXAMETHASONE SODIUM PHOSPHATE 10 MG/ML IJ SOLN
INTRAMUSCULAR | Status: AC
  Filled 2013-01-08: qty 1

## 2013-01-08 MED ORDER — CYANOCOBALAMIN 1000 MCG/ML IJ SOLN
INTRAMUSCULAR | Status: AC
  Filled 2013-01-08: qty 1

## 2013-01-08 MED ORDER — SODIUM CHLORIDE 0.9 % IJ SOLN
10.0000 mL | INTRAMUSCULAR | Status: DC | PRN
  Administered 2013-01-08: 10 mL
  Filled 2013-01-08: qty 10

## 2013-01-08 MED ORDER — SODIUM CHLORIDE 0.9 % IV SOLN
Freq: Once | INTRAVENOUS | Status: AC
  Administered 2013-01-08: 09:00:00 via INTRAVENOUS

## 2013-01-08 MED ORDER — CYANOCOBALAMIN 1000 MCG/ML IJ SOLN
1000.0000 ug | Freq: Once | INTRAMUSCULAR | Status: AC
  Administered 2013-01-08: 1000 ug via INTRAMUSCULAR

## 2013-01-08 MED ORDER — ZOLEDRONIC ACID 4 MG/100ML IV SOLN
4.0000 mg | Freq: Once | INTRAVENOUS | Status: AC
  Administered 2013-01-08: 4 mg via INTRAVENOUS
  Filled 2013-01-08: qty 100

## 2013-01-08 MED ORDER — ONDANSETRON 8 MG/50ML IVPB (CHCC)
8.0000 mg | Freq: Once | INTRAVENOUS | Status: AC
  Administered 2013-01-08: 8 mg via INTRAVENOUS

## 2013-01-08 NOTE — Patient Instructions (Signed)
[facility] Discharge Instructions for Patients Receiving Chemotherapy  Today you received the following chemotherapy agents Alimta, B12.  To help prevent nausea and vomiting after your treatment, we encourage you to take your nausea medication as prescribed.    If you develop nausea and vomiting that is not controlled by your nausea medication, call the clinic.   BELOW ARE SYMPTOMS THAT SHOULD BE REPORTED IMMEDIATELY:  *FEVER GREATER THAN 100.5 F  *CHILLS WITH OR WITHOUT FEVER  NAUSEA AND VOMITING THAT IS NOT CONTROLLED WITH YOUR NAUSEA MEDICATION  *UNUSUAL SHORTNESS OF BREATH  *UNUSUAL BRUISING OR BLEEDING  TENDERNESS IN MOUTH AND THROAT WITH OR WITHOUT PRESENCE OF ULCERS  *URINARY PROBLEMS  *BOWEL PROBLEMS  UNUSUAL RASH Items with * indicate a potential emergency and should be followed up as soon as possible.  Feel free to call the clinic you have any questions or concerns. The clinic phone number is [phone].

## 2013-01-10 NOTE — Progress Notes (Signed)
This office note has been dictated.

## 2013-01-11 NOTE — Progress Notes (Signed)
CC:   [name], M.D.  DIAGNOSES: 1. 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 2. Invasive lobular carcinoma of the right breast.  CURRENT THERAPY: 1. Alimta q.3 weeks as maintenance therapy. 2. Aromasin 25 mg p.o. daily. 3. Zometa 4 mg IV q.3 weeks.  INTERIM HISTORY:  Ms. Bresee comes in for followup.  She continues to do incredibly well.  She has done so nice this year.  She is working. She has some fatigue, but the Ritalin has helped out tremendously.  She has had no problems with increased pain.  She has some chronic low back issues because of cancer, but this seems to be under fairly good control with the Norco.  She has had no cough.  She has had no change in bowel or bladder habits. There has been no leg swelling.  She has had no rashes.  She has had no headaches.  Her, I think, last brain MRI was done on December 5th.  This is being followed by Radiation Oncology and Neurosurgery.  The MRI of the brain showed a 7.5-mm left medial temporal lobe lesion which is slightly larger.  She has slight increase in right cerebellar and anterior right frontal lesion.  There is a new 2.7-mm right temporal lobe lesion. Again, she is pretty much asymptomatic with these.  Radiation Oncology does not think that these needed to be treated right now.  Overall, her performance status is ECOG 1.  PHYSICAL EXAMINATION:  General:  This is a well-developed, well- nourished white female, in no obvious distress.  Vital Signs: Temperature of 97.7, pulse 111, respiratory rate 18, blood pressure 150/95.  Weight is 165 pounds.  Head and Neck:  Normocephalic, atraumatic skull.  She has no ocular or oral lesions.  She has no palpable cervical or supraclavicular lymph nodes.  Lungs:  Clear bilaterally.  Cardiac:  Tachycardic, but regular.  She has no murmurs, rubs, or bruits.  Abdomen:  Soft.  She has good bowel sounds.  There is no fluid wave.   There is no palpable abdominal mass.  There is no palpable hepatosplenomegaly.  Back:  No tenderness over the spine, ribs, or hips.  Extremities:  No clubbing, cyanosis, or edema.  She has good range of motion of her joints.  She has good strength in arms and legs. Neurologic:  No focal neurological deficits.  LABORATORY STUDIES:  White cell count is 5.7, hemoglobin 10.6, hematocrit 33.9, platelet count 442.  BUN is 13, creatinine 1.4.  IMPRESSION:  Ms. Teters is a very nice 57 year old white female with metastatic adenocarcinoma of the lung.  Again, she is epidermal growth factor receptor positive.  She unfortunately did not respond to Tarceva or Gilotrif. She has responded well to chemotherapy.  Her quality of life is quite good.  She is working and traveling.  She went to [place] with her family before Christmas and had a good time.  I did forget to mention that she did develop shingles last week.  This was in the right T12 dermatome.  She has not taken anything for this as of yet.  She has Valtrex, and I told her to take the Valtrex. Thankfully, she has not had any kind of postherpetic neuralgia.  These herpetic lesions are drying up.  We will go ahead and get scans setup for her.  We will get this setup in a couple of weeks.  We will need to watch her renal function a  little bit.  Her creatinine is creeping up slowly.  I will plan to see her back in another 3 weeks.    ______________________________ Josph Macho, M.D. PRE/MEDQ  D:  01/10/2013  T:  01/11/2013  Job:  [id number]

## 2013-01-14 ENCOUNTER — Encounter: Payer: Self-pay | Admitting: *Deleted

## 2013-01-17 ENCOUNTER — Encounter: Payer: Self-pay | Admitting: Nurse Practitioner

## 2013-01-17 ENCOUNTER — Ambulatory Visit (INDEPENDENT_AMBULATORY_CARE_PROVIDER_SITE_OTHER): Payer: BC Managed Care – PPO | Admitting: Nurse Practitioner

## 2013-01-17 VITALS — BP 142/98 | HR 80 | Resp 16 | Ht 60.5 in | Wt 164.0 lb

## 2013-01-17 DIAGNOSIS — C50919 Malignant neoplasm of unspecified site of unspecified female breast: Secondary | ICD-10-CM

## 2013-01-17 DIAGNOSIS — Z0000 Encounter for general adult medical examination without abnormal findings: Secondary | ICD-10-CM

## 2013-01-17 DIAGNOSIS — Z01419 Encounter for gynecological examination (general) (routine) without abnormal findings: Secondary | ICD-10-CM

## 2013-01-17 DIAGNOSIS — C50911 Malignant neoplasm of unspecified site of right female breast: Secondary | ICD-10-CM

## 2013-01-17 MED ORDER — FAMCICLOVIR 500 MG PO TABS: 500.0000 mg | ORAL_TABLET | Freq: Three times a day (TID) | ORAL | Status: DC

## 2013-01-17 NOTE — Progress Notes (Signed)
58 y.o. G2P2 Married Caucasian Fe here for annual exam.  New onset of shingles diagnosed 01/02/13.  On Famvir until yesterday but still having some pain and discomfort at 2 areas of shingles.  Still on chemotherapy for lung cancer, breast cancer, and brain lesions.  Patient and 2 sons with her husband went to [place] for Christmas Holidays - this was on her bucket list.  Patient's last menstrual period was 01/17/2004.          Sexually active: yes  The current method of family planning is none.    Exercising: no  not regularly Smoker:  no  Health Maintenance: Pap:  12/18/11 WNL/negative HR HPV History of abnormal Pap:  no MMG:  11/06/12-right breast /following malignancy, patient states the one before this was bilateral Colonoscopy:  1/10 repeat in 10 years BMD:   none TDaP:  1/13 Screening Labs: PCP/oncologist, Urine today: PROTEIN-1+, RBC-trace   reports that she has never smoked. She has never used smokeless tobacco. She reports that she drinks alcohol. She reports that she does not use illicit drugs.  Past Medical History  Diagnosis Date  . Pulmonary nodules 06/22/11    per CT scan  . IBS (irritable bowel syndrome)   . Fibroadenoma of breast     hx of  . Hx of radiation therapy 07/05/11 -07/21/11    LS, Tspine  . Hx of radiation therapy  completed7/3/13    brain, SRS  . Headache(784.0)   . Compression fracture     Multiple - Kyphoplasty  . Status post chemotherapy planned start 07/24/12    Alimta/carboplatin/Avastin / Prior chemotherapy Tarceva and Afatinib ,  . S/P radiation therapy 07/19/11     stereotactic radiosurgery to the right frontal, right thalamic, left frontal and left occipital target, 20 Gray in 1 fraction to each target  . Brain lesion 06/2712    New Lesions -3 mm, left mid cerebellum, 5 mm, left medial temporal lobe, 4 mm, right superior cerebellum, 3 mm, right anterior temporal lobe, 2 mm, left parietal cortex   . S/P radiation therapy  07/26/2012     20 Gy to Left  temporal, Left cerebellar,Left posterior superior frontal, Right anterior temporal, Right superior vermian, Left occipital       . Hx of radiation therapy 10/21/12    SRS brain  . Shingles 01/02/13  . Postmenopausal HRT (hormone replacement therapy)     10/18/2005 - 12/2011  . Metastasis of unknown origin   . Adenocarcinoma of lung Dx'd 06/2011  . 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 Breast cancer 01/04/12  . Metastatic lung cancer     Epidermal Growth Factor Positive - Afatinib    Past Surgical History  Procedure Laterality Date  . Lung biopsy    . Vertebroplasty    . Vertebroplasty    . Breast biopsy  2013    right breast  . Intrauterine device insertion  10/2005    Mirena was removed 01/24/11    Current Outpatient Prescriptions  Medication Sig Dispense Refill  . amLODipine (NORVASC) 2.5 MG tablet Take 2.5 mg by mouth daily.      Marland Kitchen dexamethasone (DECADRON) 4 MG tablet Take 1 tablet (4 mg total) by mouth 2 (two) times daily with a meal. As directed with Chemo  60 tablet  4  . exemestane (AROMASIN) 25 MG tablet Take 1 tablet (25 mg total) by mouth daily after breakfast.  30 tablet  6  .  famciclovir (FAMVIR) 500 MG tablet Take 1 tablet (500 mg total) by mouth 3 (three) times daily. One every 8 hours x 7 days  21 tablet  0  . folic acid (FOLVITE) 1 MG tablet Take 1 mg by mouth daily.      Marland Kitchen HYDROcodone-acetaminophen (NORCO) 10-325 MG per tablet Take 1 tablet by mouth every 6 (six) hours as needed for pain.      Marland Kitchen ibuprofen (ADVIL,MOTRIN) 200 MG tablet Take 600 mg by mouth every 6 (six) hours as needed for pain.       Marland Kitchen lidocaine-prilocaine (EMLA) cream Apply topically as needed.       Marland Kitchen LORazepam (ATIVAN) 0.5 MG tablet Take 1 tablet (0.5 mg total) by mouth every 6 (six) hours as needed for anxiety.  60 tablet  3  . methylphenidate (RITALIN) 5 MG tablet Take 1 pill TWICE a day.  60 tablet  0  . naphazoline (NAPHCON) 0.1 %  ophthalmic solution Place 1 drop into both eyes 4 (four) times daily as needed.  15 mL  2  . OVER THE COUNTER MEDICATION Hydrocortisone cream OTC as needed      . Probiotic Product (ALIGN) 4 MG CAPS Take by mouth every morning.      . prochlorperazine (COMPAZINE) 10 MG tablet TAKE 1 TABLET (10 MG TOTAL) BY MOUTH AS NEEDED.  30 tablet  3  . zolendronic acid (ZOMETA) 4 MG/5ML injection Inject 4 mg into the vein every 30 (thirty) days.      . ALPRAZolam (XANAX) 0.25 MG tablet Take 0.25-0.5 mg by mouth every 8 (eight) hours as needed. For anxiety.      Marland Kitchen loperamide (IMODIUM) 2 MG capsule Take 2 mg by mouth 4 (four) times daily as needed. For diarrhea       No current facility-administered medications for this visit.   Facility-Administered Medications Ordered in Other Visits  Medication Dose Route Frequency Provider Last Rate Last Dose  . sodium chloride 0.9 % injection 10 mL  10 mL Intracatheter PRN Volanda Napoleon, MD   10 mL at 09/04/12 1511    Family History  Problem Relation Age of Onset  . Cervical cancer Mother 43    cervical  . Heart disease Mother 77    CABG x 3  . Asthma Father   . Hypertension Father   . Heart disease Maternal Grandmother   . Skin cancer Maternal Grandfather     skin  . Pancreatic cancer Paternal Grandmother     pancreatic  . Heart disease Paternal Grandfather   . Throat cancer Brother 53    ROS:  Pertinent items are noted in HPI.  Otherwise, a comprehensive ROS was negative.  Exam:   BP 142/98  Pulse 80  Resp 16  Ht 5' 0.5" (1.537 m)  Wt 164 lb (74.39 kg)  BMI 31.49 kg/m2  LMP 01/17/2004  Weight change: @WEIGHT  CHANGE@ Height:   Height: 5' 0.5" (153.7 cm)  Ht Readings from Last 3 Encounters:  01/17/13 5' 0.5" (1.537 m)  11/27/12 5\' 2"  (1.575 m)  10/16/12 5\' 2"  (1.575 m)    General appearance: alert, cooperative and appears stated age Head: Normocephalic, without obvious abnormality, atraumatic Neck: no adenopathy, supple, symmetrical, trachea  midline and thyroid normal to inspection and palpation Lungs: clear to auscultation bilaterally Breasts: normal appearance, no masses or tenderness, on the left, with surgical and radiation changes on the right Heart: regular rate and rhythm Abdomen: soft, non-tender; bowel sounds normal; no masses,  no organomegaly Extremities: extremities normal, atraumatic, no cyanosis or edema Skin: Skin color, texture, turgor normal. Shingles are present from left mid back around to mid abdomen.  Several lesions are drying and several are still red and irritated. Lymph nodes: Cervical, supraclavicular, and axillary nodes normal. No abnormal inguinal nodes palpated Neurologic: Grossly normal   Pelvic: External genitalia:  no lesions              Urethra:  normal appearing urethra with no masses, tenderness or lesions              Bartholin's and Skene's: normal                 Vagina: normal appearing vagina with normal color and discharge, no lesions              Cervix: anteverted              Pap taken: no Bimanual Exam:  Uterus:  normal size, contour, position, consistency, mobility, non-tender              Adnexa: no mass, fullness, tenderness               Rectovaginal: Confirms               Anus:  normal sphincter tone, no lesions  A:  Well Woman with normal exam  Postmenopausal with HRT 10/07 - 12/13  S/P right Breast cancer with lumpectomy, chemo and radiation 12/13  History of adenocarcinoma of lung 06/2011 with metasis to brain and bone diagnosed 6/13  Shingles active outbreak  P:   Mammogram due this summer  Pap smear not done  Refilled Famvir for at least 3 more days - as lesions are not quite healed.  Counseled on breast self exam, mammography screening, adequate intake of calcium and vitamin D, diet and exercise return annually or prn  An After Visit Summary was printed and given to the patient.

## 2013-01-17 NOTE — Patient Instructions (Addendum)

## 2013-01-20 NOTE — Progress Notes (Signed)
Encounter reviewed by Dr. Brook Silva.  

## 2013-01-22 ENCOUNTER — Other Ambulatory Visit: Payer: Self-pay | Admitting: Pharmacist

## 2013-01-22 DIAGNOSIS — C3490 Malignant neoplasm of unspecified part of unspecified bronchus or lung: Secondary | ICD-10-CM

## 2013-01-27 ENCOUNTER — Ambulatory Visit (HOSPITAL_BASED_OUTPATIENT_CLINIC_OR_DEPARTMENT_OTHER)
Admission: RE | Admit: 2013-01-27 | Discharge: 2013-01-27 | Disposition: A | Discharge status: Home / Self Care | Payer: BC Managed Care – PPO | Source: Ambulatory Visit | Attending: Hematology & Oncology | Admitting: Hematology & Oncology

## 2013-01-27 DIAGNOSIS — C7800 Secondary malignant neoplasm of unspecified lung: Secondary | ICD-10-CM | POA: Insufficient documentation

## 2013-01-27 DIAGNOSIS — C7952 Secondary malignant neoplasm of bone marrow: Secondary | ICD-10-CM

## 2013-01-27 DIAGNOSIS — Z9221 Personal history of antineoplastic chemotherapy: Secondary | ICD-10-CM | POA: Insufficient documentation

## 2013-01-27 DIAGNOSIS — C3490 Malignant neoplasm of unspecified part of unspecified bronchus or lung: Secondary | ICD-10-CM | POA: Insufficient documentation

## 2013-01-27 DIAGNOSIS — C7951 Secondary malignant neoplasm of bone: Secondary | ICD-10-CM | POA: Insufficient documentation

## 2013-01-27 DIAGNOSIS — C3492 Malignant neoplasm of unspecified part of left bronchus or lung: Secondary | ICD-10-CM

## 2013-01-27 DIAGNOSIS — R222 Localized swelling, mass and lump, trunk: Secondary | ICD-10-CM | POA: Insufficient documentation

## 2013-01-27 DIAGNOSIS — K449 Diaphragmatic hernia without obstruction or gangrene: Secondary | ICD-10-CM | POA: Insufficient documentation

## 2013-01-27 DIAGNOSIS — R918 Other nonspecific abnormal finding of lung field: Secondary | ICD-10-CM | POA: Insufficient documentation

## 2013-01-27 MED ORDER — IOHEXOL 300 MG/ML  SOLN
100.0000 mL | Freq: Once | INTRAMUSCULAR | Status: AC | PRN
  Administered 2013-01-27: 100 mL via INTRAVENOUS

## 2013-01-29 ENCOUNTER — Encounter: Payer: Self-pay | Admitting: Hematology & Oncology

## 2013-01-29 ENCOUNTER — Other Ambulatory Visit (HOSPITAL_BASED_OUTPATIENT_CLINIC_OR_DEPARTMENT_OTHER): Payer: BC Managed Care – PPO | Admitting: Lab

## 2013-01-29 ENCOUNTER — Ambulatory Visit (HOSPITAL_BASED_OUTPATIENT_CLINIC_OR_DEPARTMENT_OTHER): Payer: BC Managed Care – PPO

## 2013-01-29 ENCOUNTER — Other Ambulatory Visit: Payer: Self-pay | Admitting: *Deleted

## 2013-01-29 ENCOUNTER — Ambulatory Visit (HOSPITAL_BASED_OUTPATIENT_CLINIC_OR_DEPARTMENT_OTHER): Payer: BC Managed Care – PPO | Admitting: Hematology & Oncology

## 2013-01-29 VITALS — BP 152/82 | HR 98 | Temp 98.4°F | Resp 14 | Ht 63.0 in | Wt 159.0 lb

## 2013-01-29 DIAGNOSIS — C3430 Malignant neoplasm of lower lobe, unspecified bronchus or lung: Secondary | ICD-10-CM

## 2013-01-29 DIAGNOSIS — C50919 Malignant neoplasm of unspecified site of unspecified female breast: Secondary | ICD-10-CM

## 2013-01-29 DIAGNOSIS — C7952 Secondary malignant neoplasm of bone marrow: Secondary | ICD-10-CM

## 2013-01-29 DIAGNOSIS — C799 Secondary malignant neoplasm of unspecified site: Secondary | ICD-10-CM

## 2013-01-29 DIAGNOSIS — C7951 Secondary malignant neoplasm of bone: Secondary | ICD-10-CM

## 2013-01-29 DIAGNOSIS — C50419 Malignant neoplasm of upper-outer quadrant of unspecified female breast: Secondary | ICD-10-CM

## 2013-01-29 DIAGNOSIS — C7931 Secondary malignant neoplasm of brain: Secondary | ICD-10-CM

## 2013-01-29 DIAGNOSIS — Z5111 Encounter for antineoplastic chemotherapy: Secondary | ICD-10-CM

## 2013-01-29 DIAGNOSIS — C3490 Malignant neoplasm of unspecified part of unspecified bronchus or lung: Secondary | ICD-10-CM

## 2013-01-29 DIAGNOSIS — Z5112 Encounter for antineoplastic immunotherapy: Secondary | ICD-10-CM

## 2013-01-29 LAB — CMP (CANCER CENTER ONLY)
ALBUMIN: 3.8 g/dL (ref 3.3–5.5)
ALK PHOS: 132 U/L — AB (ref 26–84)
ALT(SGPT): 22 U/L (ref 10–47)
AST: 37 U/L (ref 11–38)
BILIRUBIN TOTAL: 0.6 mg/dL (ref 0.20–1.60)
BUN: 17 mg/dL (ref 7–22)
CO2: 32 mEq/L (ref 18–33)
CREATININE: 1.7 mg/dL — AB (ref 0.6–1.2)
Calcium: 9.7 mg/dL (ref 8.0–10.3)
Chloride: 103 mEq/L (ref 98–108)
Glucose, Bld: 90 mg/dL (ref 73–118)
POTASSIUM: 3.8 meq/L (ref 3.3–4.7)
Sodium: 151 mEq/L — ABNORMAL HIGH (ref 128–145)
Total Protein: 8.5 g/dL — ABNORMAL HIGH (ref 6.4–8.1)

## 2013-01-29 LAB — CBC WITH DIFFERENTIAL (CANCER CENTER ONLY)
BASO#: 0 10*3/uL (ref 0.0–0.2)
BASO%: 0.5 % (ref 0.0–2.0)
EOS ABS: 0 10*3/uL (ref 0.0–0.5)
EOS%: 0.3 % (ref 0.0–7.0)
HCT: 32 % — ABNORMAL LOW (ref 34.8–46.6)
HGB: 10.2 g/dL — ABNORMAL LOW (ref 11.6–15.9)
LYMPH#: 0.7 10*3/uL — ABNORMAL LOW (ref 0.9–3.3)
LYMPH%: 12.4 % — ABNORMAL LOW (ref 14.0–48.0)
MCH: 32.3 pg (ref 26.0–34.0)
MCHC: 31.9 g/dL — ABNORMAL LOW (ref 32.0–36.0)
MCV: 101 fL (ref 81–101)
MONO#: 0.8 10*3/uL (ref 0.1–0.9)
MONO%: 14.1 % — AB (ref 0.0–13.0)
NEUT#: 4.3 10*3/uL (ref 1.5–6.5)
NEUT%: 72.7 % (ref 39.6–80.0)
PLATELETS: 501 10*3/uL — AB (ref 145–400)
RBC: 3.16 10*6/uL — ABNORMAL LOW (ref 3.70–5.32)
RDW: 16.1 % — AB (ref 11.1–15.7)
WBC: 6 10*3/uL (ref 3.9–10.0)

## 2013-01-29 LAB — LACTATE DEHYDROGENASE: LDH: 359 U/L — ABNORMAL HIGH (ref 94–250)

## 2013-01-29 LAB — UA PROTEIN, DIPSTICK - CHCC SATELLITE

## 2013-01-29 MED ORDER — ONDANSETRON HCL 8 MG PO TABS: 8.0000 mg | ORAL_TABLET | Freq: Two times a day (BID) | ORAL | Status: DC

## 2013-01-29 MED ORDER — SODIUM CHLORIDE 0.9 % IV SOLN: Freq: Once | INTRAVENOUS | Status: DC

## 2013-01-29 MED ORDER — SODIUM CHLORIDE 0.9 % IJ SOLN
10.0000 mL | INTRAMUSCULAR | Status: DC | PRN
  Filled 2013-01-29: qty 10

## 2013-01-29 MED ORDER — LORAZEPAM 0.5 MG PO TABS
0.5000 mg | ORAL_TABLET | Freq: Four times a day (QID) | ORAL | Status: DC | PRN
Start: 2013-01-29 — End: 2013-03-20

## 2013-01-29 MED ORDER — SODIUM CHLORIDE 0.9 % IV SOLN
Freq: Once | INTRAVENOUS | Status: AC
  Administered 2013-01-29: 12:00:00 via INTRAVENOUS

## 2013-01-29 MED ORDER — DEXAMETHASONE SODIUM PHOSPHATE 20 MG/5ML IJ SOLN
20.0000 mg | Freq: Once | INTRAMUSCULAR | Status: AC
  Administered 2013-01-29: 20 mg via INTRAVENOUS

## 2013-01-29 MED ORDER — CARBOPLATIN CHEMO INTRADERMAL TEST DOSE 100MCG/0.02ML
100.0000 ug | Freq: Once | INTRADERMAL | Status: AC
  Administered 2013-01-29: 100 ug via INTRADERMAL
  Filled 2013-01-29: qty 0.01

## 2013-01-29 MED ORDER — HEPARIN SOD (PORK) LOCK FLUSH 100 UNIT/ML IV SOLN
250.0000 [IU] | Freq: Once | INTRAVENOUS | Status: DC | PRN
  Filled 2013-01-29: qty 5

## 2013-01-29 MED ORDER — ALTEPLASE 2 MG IJ SOLR
2.0000 mg | Freq: Once | INTRAMUSCULAR | Status: DC | PRN
  Filled 2013-01-29: qty 2

## 2013-01-29 MED ORDER — ONDANSETRON 16 MG/50ML IVPB (CHCC)
16.0000 mg | Freq: Once | INTRAVENOUS | Status: AC
  Administered 2013-01-29: 16 mg via INTRAVENOUS

## 2013-01-29 MED ORDER — METHYLPHENIDATE HCL 5 MG PO TABS: ORAL_TABLET | ORAL | Status: DC

## 2013-01-29 MED ORDER — SODIUM CHLORIDE 0.9 % IJ SOLN
3.0000 mL | INTRAMUSCULAR | Status: DC | PRN
  Filled 2013-01-29: qty 10

## 2013-01-29 MED ORDER — SODIUM CHLORIDE 0.9 % IV SOLN
15.0000 mg/kg | Freq: Once | INTRAVENOUS | Status: AC
  Administered 2013-01-29: 1125 mg via INTRAVENOUS
  Filled 2013-01-29: qty 45

## 2013-01-29 MED ORDER — PROCHLORPERAZINE MALEATE 10 MG PO TABS: 10.0000 mg | ORAL_TABLET | Freq: Four times a day (QID) | ORAL | Status: DC | PRN

## 2013-01-29 MED ORDER — CARBOPLATIN CHEMO INJECTION 450 MG/45ML
337.0000 mg | Freq: Once | INTRAVENOUS | Status: AC
  Administered 2013-01-29: 340 mg via INTRAVENOUS
  Filled 2013-01-29: qty 34

## 2013-01-29 MED ORDER — DEXAMETHASONE 4 MG PO TABS: ORAL_TABLET | ORAL | Status: DC

## 2013-01-29 MED ORDER — HEPARIN SOD (PORK) LOCK FLUSH 100 UNIT/ML IV SOLN
500.0000 [IU] | Freq: Once | INTRAVENOUS | Status: DC | PRN
  Filled 2013-01-29: qty 5

## 2013-01-29 MED ORDER — SODIUM CHLORIDE 0.9 % IV SOLN
500.0000 mg/m2 | Freq: Once | INTRAVENOUS | Status: AC
  Administered 2013-01-29: 900 mg via INTRAVENOUS
  Filled 2013-01-29: qty 36

## 2013-01-29 MED ORDER — HEPARIN SOD (PORK) LOCK FLUSH 100 UNIT/ML IV SOLN
500.0000 [IU] | Freq: Once | INTRAVENOUS | Status: DC | PRN
Start: 2013-01-29 — End: 2013-01-29
  Filled 2013-01-29: qty 5

## 2013-01-29 MED ORDER — ZOLEDRONIC ACID 4 MG/5ML IV CONC: 4.0000 mg | Freq: Once | INTRAVENOUS | Status: DC

## 2013-01-29 MED ORDER — FOLIC ACID 1 MG PO TABS
1.0000 mg | ORAL_TABLET | Freq: Every day | ORAL | Status: DC
Start: 2013-01-29 — End: 2013-03-20

## 2013-01-29 NOTE — Progress Notes (Signed)
This office note has been dictated.

## 2013-01-31 NOTE — Progress Notes (Signed)
CC:   Orpah Melter, M.D.  DIAGNOSES: 1. 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 2. Invasive lobular carcinoma of the right breast.  CURRENT THERAPY: 1. Alimta q.3 dosing. 2. Aromasin 25 mg p.o. daily 3. Zometa 4 mg IV q.3 weeks.  INTERIM HISTORY:  Ms. Henson comes in for followup.  Unfortunately, looks like she might be progressing slightly.  We did go ahead and repeat a CT scan on her.  This was done on January 12th.  The CT scan showed mild progression of her pulmonary disease.  Her primary mass in the left lung measures 4.3 x 3.9 cm.  This is slightly increased.  She has pulmonary nodules which have increased slightly.  There is no disease below the diaphragm, in the liver, or lymph nodes.  She has had no obvious hilar/mediastinal lymph nodes.  There is no pericardial involvement.  There is no pelvic or abdominal adenopathy.  She has bony mets, which appear to be stable.  Pain-wise, she is doing pretty well.  She did have radiation therapy to the brain.  She did well with this. __________ stereotactic procedure.  She has had no fever, sweats, or chills.  She has little more anxiety.  I certainly understand this.  She is working.  She is not as busy as she would like to be at work.  She has had no bleeding.  There has been no change in bowel or bladder habits.  She has had no cough.  PHYSICAL EXAMINATION:  General:  This is a well-developed, well- nourished white female, in no obvious distress.  Vital Signs: Temperature 98.4, pulse 98, respiratory rate 14, blood pressure 152/82. Weight is 159 pounds.  Head and Neck:  Normocephalic, atraumatic skull. There are no ocular or oral lesions.  There are no palpable cervical or supraclavicular lymph nodes.  Lungs:  Clear bilaterally.  Cardiac: Regular rate and rhythm with normal S1, S2.  There are no murmurs, rubs, or bruits.  Abdomen:  Soft.  She has good bowel sounds.  There is no fluid wave.  No palpable  abdominal mass is noted.  There is no palpable hepatosplenomegaly.  Back:  No tenderness over the spine, ribs, or hips. Extremities:  No clubbing, cyanosis, or edema.  She has good range motion of her joints.  Skin:  No rashes, ecchymoses, or petechia.  She has healing shingle lesions in the left L3-4 dermatome.  Neurological: No focal neurological deficit.  LABORATORY STUDIES:  White cell count is 6, hemoglobin 10.2, hematocrit 32, platelet count 501.  Calcium is __________ with an albumin of 3.8. BUN 17, creatinine 1.7.  IMPRESSION:  Ms. Brotherton is a very nice 58 year old white female.  She has two separate primary malignancy.  We are treating lung cancer more aggressively.  The breast cancer really is not a problem from my point of view.  She is on Aromasin and doing well with this.  Unfortunately, I think we will have to go back to chemotherapy.  I just think that __________, even though asymptomatic, probably warrants full- dose chemotherapy.  We will go ahead and get her back on carboplatin with Alimta and Avastin.  I think this is all very reasonable.  I have noted that her renal function is slowly declining.  I told her to make sure that she drinks a lot of fluid.  We will have to follow this closely.  I spent a good half hour more with her and her husband.  I went over the scans  with them.  I explained our change in strategy.  She agrees.  We will go ahead and plan to get her back in 3 more weeks.  I did refill her Ritalin.    ______________________________ Volanda Napoleon, M.D. PRE/MEDQ  D:  01/29/2013  T:  01/30/2013  Job:  [id number]

## 2013-02-02 NOTE — Progress Notes (Unsigned)
Patient ID: SHAUNTELLE JAMERSON, female   DOB: Mar 31, 1955, 58 y.o.   MRN: [id number]  Radiation Oncology [phone]) [phone]  ________________________________  Name: YOSHIKA VENSEL MRN: [id number]  Date: 12/30/2012 DOB: [birth year]  SPECIAL TREATMENT PROCEDURE  3D TREATMENT PLANNING AND DOSIMETRY: The patient's radiation plan was reviewed and approved by Dr. Vertell Limber from neurosurgery and Dr. Lisbeth Renshaw from radiation oncology prior to treatment. It showed 3-dimensional radiation distributions overlaid onto the planning CT/MRI image set. [facility] for the target structures as well as the organs at risk were reviewed. The documentation of the 3D plan and dosimetry are filed in the radiation oncology EMR.  NARRATIVE: The patient was brought to the TrueBeam stereotactic radiation treatment machine and placed supine on the CT couch. The head frame was applied, and the patient was set up for stereotactic radiosurgery. Neurosurgery was present for the set-up and delivery  SIMULATION VERIFICATION: In the couch zero-angle position, the patient underwent Exactrac imaging using the Brainlab system with orthogonal KV images. These were carefully aligned and repeated to confirm treatment position for each of the isocenters. The Exactrac snap film verification was repeated at each couch angle.  SPECIAL TREATMENT PROCEDURE: The patient received stereotactic radiosurgery to the following targets:  1. Right frontal 3.8 mm target was treated using 3 circular cone Arcs to a prescription dose of 20 Gy. ExacTrac Snap verification was performed for each couch angle.  2. Right temporal 2.7 mm target was treated using 3 circular cone Arcs to a prescription dose of 20 Gy. ExacTrac Snap verification was performed for each couch angle.  STEREOTACTIC TREATMENT MANAGEMENT: Following delivery, the patient was transported to nursing in stable condition and monitored for possible acute effects. Vital signs were recorded . The patient tolerated  treatment without significant acute effects, and was discharged to home in stable condition.  PLAN: Follow-up in one month.  ------------------------------------------------   Marchia Meiers. Vertell Limber, MD

## 2013-02-03 ENCOUNTER — Ambulatory Visit
Admission: RE | Admit: 2013-02-03 | Payer: BC Managed Care – PPO | Source: Ambulatory Visit | Admitting: Radiation Oncology

## 2013-02-03 ENCOUNTER — Telehealth: Payer: Self-pay | Admitting: *Deleted

## 2013-02-03 NOTE — Telephone Encounter (Signed)
Called patient at home, "I'm so sorry I totallly forgot with chemo and verything weak," will transfer to Uvaldo Bristle to reschedule follow up, patient  Thanked mwe for the call and apologized for forgetting, transferred call to scheduler and informed Dr.moody of recheduled appt 9:04 AM

## 2013-02-14 ENCOUNTER — Encounter: Payer: Self-pay | Admitting: Radiation Oncology

## 2013-02-17 ENCOUNTER — Ambulatory Visit
Admission: RE | Admit: 2013-02-17 | Discharge: 2013-02-17 | Disposition: A | Discharge status: Home / Self Care | Payer: BC Managed Care – PPO | Source: Ambulatory Visit | Attending: Radiation Oncology | Admitting: Radiation Oncology

## 2013-02-17 ENCOUNTER — Encounter: Payer: Self-pay | Admitting: Radiation Oncology

## 2013-02-17 ENCOUNTER — Other Ambulatory Visit: Payer: Self-pay | Admitting: Radiation Therapy

## 2013-02-17 VITALS — BP 142/97 | HR 113 | Temp 98.8°F | Resp 20 | Wt 155.6 lb

## 2013-02-17 DIAGNOSIS — C7949 Secondary malignant neoplasm of other parts of nervous system: Principal | ICD-10-CM

## 2013-02-17 DIAGNOSIS — C7931 Secondary malignant neoplasm of brain: Secondary | ICD-10-CM

## 2013-02-17 DIAGNOSIS — C3490 Malignant neoplasm of unspecified part of unspecified bronchus or lung: Secondary | ICD-10-CM

## 2013-02-17 NOTE — Progress Notes (Signed)
Radiation Oncology         [phone]) [phone] ________________________________  Name: [name] MRN: [id number]  Date: 02/17/2013  DOB: [birth year]  Follow-Up Visit Note  Outpatient  CC: Orpah Melter, MD  Orpah Melter, MD  Diagnosis:   Adenocarcinoma of the lung, with brain metastases  She also has early stage breast cancer managed with Aromasin   Radiation treatment dates:   12/30/2012   1. Right frontal 3.8 mm target was treated using 3 circular cone Arcs to a prescription dose of 20 Gy. ExacTrac Snap verification was performed for each couch angle.  2. Right temporal 2.7 mm target was treated using 3 circular cone Arcs to a prescription dose of 20 Gy. ExacTrac Snap verification was performed for each couch angle.  Of note, Dr Lisbeth Renshaw discussed possible treatment to 4 lesions in the brain conference. 2 of the 4 lesions had been treated previously. These are small lesions with a slight increase and these therefore will be followed on future scans.  She completed stereotactic radiosurgery to the Left temporal 97mm target, Left cerebellar 8mm target , Left posterior superior frontal 84mm target, Right anterior temporal 77mm target , Right superior vermian 5mm target , Left occipital 67mm target, all to 20 Gy in 07-26-12   She completed stereotactic radiosurgery to the right frontal, right thalamic, left frontal and left occipital target, 20 Gray in 1 fraction to each target, on 07/19/11.   07/05/2011-07/21/2011 - LS spine and thoracic spine (T7-T9) 30 Gy in 10 sessions   Narrative:  The patient returns today for routine follow-up. She received SRS to her brain in December as above by Dr. Lisbeth Renshaw.  Symptomatically, she denies HA, pain, dizziness, unsteady gait, focal neuro deficits, vision changes or respiratory issues. She does report nausea that comes and goes, takes Zofran or Compazine prn w/good relief. She is taking Decadron only in relation to her chemotherapy.  Status of systemic  CT imaging of CAP on 01/27/13 shows progressive pulmonary metastasis. She has therefore resumed chemotherapy. Osseous disease appears stable.   She continues to work.  Medical oncology's plans for the patient are to go ahead and get her back on carboplatin with Alimta and Avastin.      ALLERGIES:  has No Known Allergies.  Meds: Current Outpatient Prescriptions  Medication Sig Dispense Refill  . ALPRAZolam (XANAX) 0.25 MG tablet Take 0.25-0.5 mg by mouth every 8 (eight) hours as needed. For anxiety.      Marland Kitchen amLODipine (NORVASC) 2.5 MG tablet Take 2.5 mg by mouth daily.      Marland Kitchen dexamethasone (DECADRON) 4 MG tablet Take 1 tab two times a day the day before Alimta chemo. Take 2 tabs two times a day starting the day after chemo for 3 days.  30 tablet  1  . exemestane (AROMASIN) 25 MG tablet Take 1 tablet (25 mg total) by mouth daily after breakfast.  30 tablet  6  . folic acid (FOLVITE) 1 MG tablet Take 1 tablet (1 mg total) by mouth daily. Take daily starting 5-7 days before Alimta chemotherapy. Continue until 21 days after Alimta completed.  100 tablet  3  . HYDROcodone-acetaminophen (NORCO) 10-325 MG per tablet Take 1 tablet by mouth every 6 (six) hours as needed for pain.      Marland Kitchen ibuprofen (ADVIL,MOTRIN) 200 MG tablet Take 600 mg by mouth every 6 (six) hours as needed for pain.       Marland Kitchen lidocaine-prilocaine (EMLA) cream Apply topically as needed.       Marland Kitchen  loperamide (IMODIUM) 2 MG capsule Take 2 mg by mouth 4 (four) times daily as needed. For diarrhea      . LORazepam (ATIVAN) 0.5 MG tablet Take 1 tablet (0.5 mg total) by mouth every 6 (six) hours as needed (Nausea or vomiting).  30 tablet  0  . methylphenidate (RITALIN) 5 MG tablet Take 1 pill TWICE a day.  60 tablet  0  . naphazoline (NAPHCON) 0.1 % ophthalmic solution Place 1 drop into both eyes 4 (four) times daily as needed.  15 mL  2  . nitrofurantoin (MACRODANTIN) 100 MG capsule Take 100 mg by mouth 2 (two) times daily.      . ondansetron  (ZOFRAN) 8 MG tablet Take 1 tablet (8 mg total) by mouth 2 (two) times daily. Take two times a day starting the day after chemo for 3 days. Then take two times a day as needed for nausea or vomiting.  30 tablet  1  . Probiotic Product (ALIGN) 4 MG CAPS Take by mouth every morning.      . prochlorperazine (COMPAZINE) 10 MG tablet Take 1 tablet (10 mg total) by mouth every 6 (six) hours as needed (Nausea or vomiting).  30 tablet  1  . zolendronic acid (ZOMETA) 4 MG/5ML injection Inject 4 mg into the vein every 30 (thirty) days.       No current facility-administered medications for this encounter.   Facility-Administered Medications Ordered in Other Encounters  Medication Dose Route Frequency Provider Last Rate Last Dose  . sodium chloride 0.9 % injection 10 mL  10 mL Intracatheter PRN Volanda Napoleon, MD   10 mL at 09/04/12 1511    Physical Findings: The patient is in no acute distress. Patient is alert and oriented.  weight is 155 lb 9.6 oz (70.58 kg). Her oral temperature is 98.8 F (37.1 C). Her blood pressure is 142/97 and her pulse is 113. Her respiration is 20. Marland Kitchen  No significant changes. General: Alert and oriented, in no acute distress HEENT: Head is normocephalic. Pupils are equally round and reactive to light. Extraocular movements are intact. Oropharynx is clear. Neck: Neck is supple, no palpable cervical or supraclavicular lymphadenopathy. Heart: Regular in rate and rhythm with no murmurs, rubs, or gallops. Chest: Clear to auscultation bilaterally, with no rhonchi, wheezes, or rales. Extremities: No cyanosis or edema. Musculoskeletal: symmetric strength and muscle tone throughout. Neurologic: Cranial nerves II through XII are grossly intact. No obvious focalities. Speech is fluent. Coordination is intact. Gait normal. Reflexes symmetric and WNL. Psychiatric: Judgment and insight are intact. Affect is appropriate.    Lab Findings: Lab Results  Component Value Date   WBC 6.0  01/29/2013   HGB 10.2* 01/29/2013   HCT 32.0* 01/29/2013   MCV 101 01/29/2013   PLT 501* 01/29/2013    CMP     Component Value Date/Time   NA 151* 01/29/2013 0847   NA 143 08/14/2012 0907   K 3.8 01/29/2013 0847   K 3.9 08/14/2012 0907   CL 103 01/29/2013 0847   CL 106 08/14/2012 0907   CO2 32 01/29/2013 0847   CO2 25 08/14/2012 0907   GLUCOSE 90 01/29/2013 0847   GLUCOSE 111* 08/14/2012 0907   BUN 17 01/29/2013 0847   BUN 10 08/14/2012 0907   CREATININE 1.7* 01/29/2013 0847   CREATININE 0.71 08/14/2012 0907   CALCIUM 9.7 01/29/2013 0847   CALCIUM 9.0 08/14/2012 0907   PROT 8.5* 01/29/2013 0847   PROT 6.3 08/14/2012 1062  ALBUMIN 3.8 08/14/2012 0907   AST 37 01/29/2013 0847   AST 31 08/14/2012 0907   ALT 22 01/29/2013 0847   ALT 37* 08/14/2012 0907   ALKPHOS 132* 01/29/2013 0847   ALKPHOS 270* 08/14/2012 0907   BILITOT 0.60 01/29/2013 0847   BILITOT 0.2* 08/14/2012 0907   GFRNONAA >90 12/13/2011 0755   GFRAA >90 12/13/2011 0755     Radiographic Findings: Ct Chest W Contrast  01/27/2013   CLINICAL DATA:  Adenocarcinoma of left lung, status post chemotherapy.  EXAM: CT CHEST, ABDOMEN, AND PELVIS WITH CONTRAST  TECHNIQUE: Multidetector CT imaging of the chest, abdomen and pelvis was performed following the standard protocol during bolus administration of intravenous contrast.  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 Bilateral pulmonary nodules. Cavitary 1.1 cm right lower lobe nodule on image 31 measured 1.0 cm on the prior.  Right lower lobe 7 mm nodule on image 32 demonstrates cavitation and is enlarged from 5 mm on the prior.  Superior segment left lower lobe nodule measures 1.0 cm on image 26 and is better defined than on 7 mm on the prior.  Left lower lobe lung mass measures 4.3 x 3.9 cm on images 39 and 40 of series 4.  4.1 x 3.7 cm on the prior (when remeasured). 3.8 cm craniocaudal today versus 3.5 cm on prior.  No pleural effusion or pneumothorax.  Heart/Mediastinum: No supraclavicular adenopathy. Right-sided Port-A-Cath which terminates at the low SVC. Borderline cardiomegaly, without pericardial effusion. No central pulmonary embolism, on this non-dedicated study. No mediastinal or hilar adenopathy. Tiny hiatal hernia.  CT ABDOMEN AND PELVIS FINDINGS  Abdomen/Pelvis: Normal liver, spleen, distal stomach, pancreas, gallbladder, biliary tract, adrenal glands.  Similar appearance of poor cortical medullary differentiation within both kidneys. Heterogeneous enhancement on the delayed images.  No retroperitoneal or retrocrural adenopathy. Normal colon and terminal ileum. Normal small bowel without abdominal ascites.  No pelvic adenopathy. Pelvic floor laxity. Otherwise normal urinary bladder and uterus, without adnexal mass or significant free pelvic fluid.  Bones/Musculoskeletal: Extensive sclerotic osseous metastasis. Pathologic fracture of the right inferior pubic ramus. Distribution of metastasis is similar. Prior vertebral augmentation at multiple thoracic and lumbar levels. L1, T10, T8, T7 compression fractures are all similar. Mild canal compromise at the L1 level is not significantly changed.  IMPRESSION: CT CHEST IMPRESSION  1. Slight enlargement of a partially cavitary left lower lobe lung mass. 2. Progressive pulmonary metastasis. 3. Similar osseous metastasis.  CT ABDOMEN AND PELVIS IMPRESSION  1. No extraosseous metastasis within the abdomen or pelvis. 2. Similar osseous metastasis. 3. Similar heterogeneous enhancement of the bilateral kidneys. Nonspecific, especially given chronicity. Consider correlation with urinalysis to exclude infection or inflammation.   Electronically Signed   By: Abigail Miyamoto M.D.   On: 01/27/2013 13:57   Ct Abdomen Pelvis W Contrast  01/27/2013   CLINICAL DATA:  Adenocarcinoma of left  lung, status post chemotherapy.  EXAM: CT CHEST, ABDOMEN, AND PELVIS WITH CONTRAST  TECHNIQUE: Multidetector CT imaging of the chest, abdomen and pelvis was performed following the standard protocol during bolus administration of intravenous contrast.  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 Bilateral pulmonary nodules. Cavitary 1.1 cm right  lower lobe nodule on image 31 measured 1.0 cm on the prior.  Right lower lobe 7 mm nodule on image 32 demonstrates cavitation and is enlarged from 5 mm on the prior.  Superior segment left lower lobe nodule measures 1.0 cm on image 26 and is better defined than on 7 mm on the prior.  Left lower lobe lung mass measures 4.3 x 3.9 cm on images 39 and 40 of series 4. 4.1 x 3.7 cm on the prior (when remeasured). 3.8 cm craniocaudal today versus 3.5 cm on prior.  No pleural effusion or pneumothorax.  Heart/Mediastinum: No supraclavicular adenopathy. Right-sided Port-A-Cath which terminates at the low SVC. Borderline cardiomegaly, without pericardial effusion. No central pulmonary embolism, on this non-dedicated study. No mediastinal or hilar adenopathy. Tiny hiatal hernia.  CT ABDOMEN AND PELVIS FINDINGS  Abdomen/Pelvis: Normal liver, spleen, distal stomach, pancreas, gallbladder, biliary tract, adrenal glands.  Similar appearance of poor cortical medullary differentiation within both kidneys. Heterogeneous enhancement on the delayed images.  No retroperitoneal or retrocrural adenopathy. Normal colon and terminal ileum. Normal small bowel without abdominal ascites.  No pelvic adenopathy. Pelvic floor laxity. Otherwise normal urinary bladder and uterus, without adnexal mass or significant free pelvic fluid.  Bones/Musculoskeletal: Extensive sclerotic osseous metastasis. Pathologic fracture of the right  inferior pubic ramus. Distribution of metastasis is similar. Prior vertebral augmentation at multiple thoracic and lumbar levels. L1, T10, T8, T7 compression fractures are all similar. Mild canal compromise at the L1 level is not significantly changed.  IMPRESSION: CT CHEST IMPRESSION  1. Slight enlargement of a partially cavitary left lower lobe lung mass. 2. Progressive pulmonary metastasis. 3. Similar osseous metastasis.  CT ABDOMEN AND PELVIS IMPRESSION  1. No extraosseous metastasis within the abdomen or pelvis. 2. Similar osseous metastasis. 3. Similar heterogeneous enhancement of the bilateral kidneys. Nonspecific, especially given chronicity. Consider correlation with urinalysis to exclude infection or inflammation.   Electronically Signed   By: Abigail Miyamoto M.D.   On: 01/27/2013 13:57    Impression/Plan:  . Doing well clinically with no new neurologic issues; CT shows progressive lung mets. She will continue chemotherapy for progressive lung disease.  Plan to re-scan brain with 3T MRI 3 months from her [place] treatment date.  I gave her my card and Lerry Liner card to call if any concerns or new symptoms arise before then. She is pleased with this plan.  I spent 15 minutes minutes face to face with the patient and more than 50% of that time was spent in counseling and/or coordination of care. _____________________________________   Eppie Gibson, MD

## 2013-02-17 NOTE — Progress Notes (Signed)
Pt is alert and oriented x 3. Pt denies HA, pain, dizziness, unsteady gait, vision changes or issues. She does report nausea that comes and goes, takes Zofran or Compazine prn w/good relief. She is taking Decadron only in relation to her chemotherapy.

## 2013-02-19 ENCOUNTER — Encounter: Payer: Self-pay | Admitting: Hematology & Oncology

## 2013-02-19 ENCOUNTER — Ambulatory Visit (HOSPITAL_BASED_OUTPATIENT_CLINIC_OR_DEPARTMENT_OTHER): Payer: BC Managed Care – PPO

## 2013-02-19 ENCOUNTER — Other Ambulatory Visit (HOSPITAL_BASED_OUTPATIENT_CLINIC_OR_DEPARTMENT_OTHER): Payer: BC Managed Care – PPO | Admitting: Lab

## 2013-02-19 ENCOUNTER — Ambulatory Visit (HOSPITAL_BASED_OUTPATIENT_CLINIC_OR_DEPARTMENT_OTHER): Payer: BC Managed Care – PPO | Admitting: Hematology & Oncology

## 2013-02-19 VITALS — BP 154/91 | HR 114 | Temp 98.3°F | Resp 14 | Ht 63.0 in | Wt 157.0 lb

## 2013-02-19 VITALS — BP 150/90 | HR 95 | Temp 98.0°F | Resp 20

## 2013-02-19 DIAGNOSIS — Z5111 Encounter for antineoplastic chemotherapy: Secondary | ICD-10-CM

## 2013-02-19 DIAGNOSIS — C7952 Secondary malignant neoplasm of bone marrow: Secondary | ICD-10-CM

## 2013-02-19 DIAGNOSIS — C7951 Secondary malignant neoplasm of bone: Secondary | ICD-10-CM

## 2013-02-19 DIAGNOSIS — C3430 Malignant neoplasm of lower lobe, unspecified bronchus or lung: Secondary | ICD-10-CM

## 2013-02-19 DIAGNOSIS — C3490 Malignant neoplasm of unspecified part of unspecified bronchus or lung: Secondary | ICD-10-CM

## 2013-02-19 DIAGNOSIS — R030 Elevated blood-pressure reading, without diagnosis of hypertension: Secondary | ICD-10-CM

## 2013-02-19 LAB — CBC WITH DIFFERENTIAL (CANCER CENTER ONLY)
BASO#: 0.1 10*3/uL (ref 0.0–0.2)
BASO%: 1.1 % (ref 0.0–2.0)
EOS%: 0.1 % (ref 0.0–7.0)
Eosinophils Absolute: 0 10*3/uL (ref 0.0–0.5)
HEMATOCRIT: 28.7 % — AB (ref 34.8–46.6)
HGB: 9.3 g/dL — ABNORMAL LOW (ref 11.6–15.9)
LYMPH#: 0.9 10*3/uL (ref 0.9–3.3)
LYMPH%: 11.2 % — ABNORMAL LOW (ref 14.0–48.0)
MCH: 32.2 pg (ref 26.0–34.0)
MCHC: 32.4 g/dL (ref 32.0–36.0)
MCV: 99 fL (ref 81–101)
MONO#: 1 10*3/uL — AB (ref 0.1–0.9)
MONO%: 12.1 % (ref 0.0–13.0)
NEUT%: 75.5 % (ref 39.6–80.0)
NEUTROS ABS: 6.2 10*3/uL (ref 1.5–6.5)
Platelets: 393 10*3/uL (ref 145–400)
RBC: 2.89 10*6/uL — AB (ref 3.70–5.32)
RDW: 16.6 % — ABNORMAL HIGH (ref 11.1–15.7)
WBC: 8.2 10*3/uL (ref 3.9–10.0)

## 2013-02-19 LAB — CMP (CANCER CENTER ONLY)
ALBUMIN: 3.5 g/dL (ref 3.3–5.5)
ALK PHOS: 151 U/L — AB (ref 26–84)
ALT(SGPT): 26 U/L (ref 10–47)
AST: 40 U/L — AB (ref 11–38)
BILIRUBIN TOTAL: 0.5 mg/dL (ref 0.20–1.60)
BUN, Bld: 21 mg/dL (ref 7–22)
CO2: 30 mEq/L (ref 18–33)
Calcium: 9.7 mg/dL (ref 8.0–10.3)
Chloride: 99 mEq/L (ref 98–108)
Creat: 1.9 mg/dl — ABNORMAL HIGH (ref 0.6–1.2)
Glucose, Bld: 119 mg/dL — ABNORMAL HIGH (ref 73–118)
POTASSIUM: 4 meq/L (ref 3.3–4.7)
SODIUM: 141 meq/L (ref 128–145)
TOTAL PROTEIN: 8.1 g/dL (ref 6.4–8.1)

## 2013-02-19 LAB — TECHNOLOGIST REVIEW CHCC SATELLITE

## 2013-02-19 LAB — LACTATE DEHYDROGENASE: LDH: 348 U/L — ABNORMAL HIGH (ref 94–250)

## 2013-02-19 MED ORDER — SODIUM CHLORIDE 0.9 % IV SOLN
Freq: Once | INTRAVENOUS | Status: AC
  Administered 2013-02-19: 10:00:00 via INTRAVENOUS

## 2013-02-19 MED ORDER — SODIUM CHLORIDE 0.9 % IV SOLN
400.0000 mg/m2 | Freq: Once | INTRAVENOUS | Status: AC
  Administered 2013-02-19: 700 mg via INTRAVENOUS
  Filled 2013-02-19: qty 28

## 2013-02-19 MED ORDER — SODIUM CHLORIDE 0.9 % IJ SOLN
10.0000 mL | INTRAMUSCULAR | Status: DC | PRN
  Administered 2013-02-19: 10 mL
  Filled 2013-02-19: qty 10

## 2013-02-19 MED ORDER — HEPARIN SOD (PORK) LOCK FLUSH 100 UNIT/ML IV SOLN
500.0000 [IU] | Freq: Once | INTRAVENOUS | Status: AC | PRN
  Administered 2013-02-19: 500 [IU]
  Filled 2013-02-19: qty 5

## 2013-02-19 MED ORDER — ONDANSETRON 16 MG/50ML IVPB (CHCC)
INTRAVENOUS | Status: AC
  Filled 2013-02-19: qty 16

## 2013-02-19 MED ORDER — DEXAMETHASONE SODIUM PHOSPHATE 20 MG/5ML IJ SOLN
INTRAMUSCULAR | Status: AC
  Filled 2013-02-19: qty 5

## 2013-02-19 MED ORDER — CARBOPLATIN CHEMO INTRADERMAL TEST DOSE 100MCG/0.02ML
100.0000 ug | Freq: Once | INTRADERMAL | Status: AC
  Administered 2013-02-19: 100 ug via INTRADERMAL
  Filled 2013-02-19: qty 0.02

## 2013-02-19 MED ORDER — SODIUM CHLORIDE 0.9 % IV SOLN
300.0000 mg | Freq: Once | INTRAVENOUS | Status: AC
  Administered 2013-02-19: 300 mg via INTRAVENOUS
  Filled 2013-02-19: qty 30

## 2013-02-19 MED ORDER — ZOLEDRONIC ACID 4 MG/5ML IV CONC: 4.0000 mg | Freq: Once | INTRAVENOUS | Status: DC

## 2013-02-19 MED ORDER — HEPARIN SOD (PORK) LOCK FLUSH 100 UNIT/ML IV SOLN
250.0000 [IU] | Freq: Once | INTRAVENOUS | Status: DC | PRN
  Filled 2013-02-19: qty 5

## 2013-02-19 MED ORDER — ONDANSETRON 16 MG/50ML IVPB (CHCC)
16.0000 mg | Freq: Once | INTRAVENOUS | Status: AC
  Administered 2013-02-19: 16 mg via INTRAVENOUS

## 2013-02-19 MED ORDER — DEXAMETHASONE SODIUM PHOSPHATE 20 MG/5ML IJ SOLN
20.0000 mg | Freq: Once | INTRAMUSCULAR | Status: AC
  Administered 2013-02-19: 20 mg via INTRAVENOUS

## 2013-02-19 NOTE — Patient Instructions (Signed)
[place] Discharge Instructions for Patients Receiving Chemotherapy  Today you received the following chemotherapy agents Carboplatin and Alimta  To help prevent nausea and vomiting after your treatment, we encourage you to take your nausea medication    If you develop nausea and vomiting that is not controlled by your nausea medication, call the clinic.   BELOW ARE SYMPTOMS THAT SHOULD BE REPORTED IMMEDIATELY:  *FEVER GREATER THAN 100.5 F  *CHILLS WITH OR WITHOUT FEVER  NAUSEA AND VOMITING THAT IS NOT CONTROLLED WITH YOUR NAUSEA MEDICATION  *UNUSUAL SHORTNESS OF BREATH  *UNUSUAL BRUISING OR BLEEDING  TENDERNESS IN MOUTH AND THROAT WITH OR WITHOUT PRESENCE OF ULCERS  *URINARY PROBLEMS  *BOWEL PROBLEMS  UNUSUAL RASH Items with * indicate a potential emergency and should be followed up as soon as possible.  Feel free to call the clinic you have any questions or concerns. The clinic phone number is [phone].

## 2013-02-19 NOTE — Progress Notes (Signed)
This office note has been dictated.

## 2013-02-20 NOTE — Progress Notes (Signed)
DIAGNOSES: 1. Metastatic adenocarcinoma of the lung-EGFR positive. 2. Invasive lobular carcinoma of the right breast.  CURRENT THERAPY: 1. Carboplatin/Alimta/Avastin q.3 week. 2. Aromasin 25 mg p.o. daily. 3. Zometa 4 mg IV q.3 weeks.  INTERIM HISTORY:  [name] comes in for followup.  She feels a little more tired.  She does have little more of nausea.  We had to increase her chemotherapy with the last visit.  Her scan seemed to show some possible progression.  We got back on carboplatin, Alimta and Avastin.  Again, she is a little more fatigued.  She does not have much of an appetite.  She has had a little more of nausea.  There has been no headache.  She has had no bleeding.  She has had no change in bowel or bladder habits.  She has had no leg swelling.  Her pain control seemed to doing pretty well.  Overall, her performance status is ECOG 1.  PHYSICAL EXAMINATION:  General:  This is a fairly well-developed, well- nourished white female, in no obvious distress.  She is alert and oriented x3.  Vital Signs:  Temperature of 98.3, pulse 114, respiratory rate of 16, blood pressure 154/91, weight is 157.  Head and Neck: Normocephalic, atraumatic skull.  There are no ocular or oral lesions. There are no palpable cervical or supraclavicular lymph nodes.  Lungs: Clear bilaterally.  She has no rales, wheezes, or rhonchi.  Cardiac: Regular.  She has no murmurs, rubs, or bruits.  Abdomen:  Soft.  She has good bowel sounds.  There is no fluid wave.  There is no palpable abdominal mass.  There is no palpable hepatosplenomegaly.  Back:  No tenderness over the spine, ribs, or hips.  Extremities:  No clubbing, cyanosis, or edema.  She has good range of motion of her joints.  She has good strength in her arms and legs.  She has good distal extremity pulses.  Skin:  No rashes, ecchymosis, or petechia.  Neurological:  No focal neurological deficits.  LABORATORY STUDIES:  White cell  count is 8.2, hemoglobin 9.3, hematocrit 28.7, platelet count 393.  BUN 21, creatinine 1.9.  LDH is 348.  Calcium is 9.7 with an albumin of 3.5.  IMPRESSION:  [name] is a very charming 58 year old white female. She has metastatic adenocarcinoma of the lung.  She is EGFR positive.  She is now back on "full dose" chemotherapy.  She did have 1 cycle.  Unfortunately, I think we are going to have to hold the Avastin today. Her blood pressure is little on the high side.  I just feel that we have to manage her side effects.  I will reduce the dose of Alimta a little bit.  I have noticed that her renal function is going up a little bit.  We will hold her Zometa today.  I think that we should give her an extra week break between this cycle and the next cycle.  I think this would really be helpful for her.  We will repeat her scans after her next cycle of chemotherapy.  I think this would be a reasonable time to do x-rays on her.  Thankfully, some recent studies are now using PD-1 inhibitors with lung cancer.  This certainly is encouraging and hopefully the FDA will approve these for use so that we could consider these for [name].  She is on Ritalin.  Ritalin does seem to help her out.  I will plan to see back in 4 weeks  now.  Hopefully, this extra week of break will allow her to feel a little bit better and we can see how her blood counts work.    ______________________________ Volanda Napoleon, M.D. PRE/MEDQ  D:  02/19/2013  T:  02/20/2013  Job:  [id number]

## 2013-02-28 ENCOUNTER — Telehealth: Payer: Self-pay | Admitting: Dietician

## 2013-02-28 NOTE — Telephone Encounter (Signed)
Brief Outpatient Oncology Nutrition Note  Patient has been identified to be at risk on malnutrition screen.  Wt Readings from Last 10 Encounters:  02/19/13 157 lb (71.215 kg)  02/17/13 155 lb 9.6 oz (70.58 kg)  01/29/13 159 lb (72.122 kg)  01/17/13 164 lb (74.39 kg)  12/25/12 165 lb 14.4 oz (75.252 kg)  12/23/12 166 lb 3.2 oz (75.388 kg)  11/27/12 170 lb (77.111 kg)  10/21/12 166 lb 1.6 oz (75.342 kg)  10/16/12 170 lb (77.111 kg)  09/26/12 169 lb (76.658 kg)    Dx:  Metastatic adenocarcinoma of the lung-EGFR positive and invasive lobular carcinoma of the right breast.  Currently receiving chemo.  Called patient due to weight loss.  Patient reports that she has a poor appetite and has not been eating well.  Eats very small amounts at meals and is drinking the [employer].  Patient has lost 13 lbs in the past 4 months.    Instructed patient to begin taking 5-6 small meals daily.  Increase supplements to 2-3 daily.  Options include [employer], Ensure, Boost, or current shake.  Discussed need to choose high calorie foods.    Provided patient with the [place] RD who is available as needed.  Antonieta Iba, RD, LDN Clinical Inpatient Dietitian Pager:  [phone] Weekend and after hours pager:  [phone]

## 2013-03-12 ENCOUNTER — Ambulatory Visit: Payer: BC Managed Care – PPO | Admitting: Hematology & Oncology

## 2013-03-12 ENCOUNTER — Other Ambulatory Visit: Payer: BC Managed Care – PPO | Admitting: Lab

## 2013-03-12 ENCOUNTER — Ambulatory Visit: Payer: BC Managed Care – PPO

## 2013-03-19 ENCOUNTER — Other Ambulatory Visit: Payer: Self-pay | Admitting: *Deleted

## 2013-03-19 ENCOUNTER — Ambulatory Visit (HOSPITAL_BASED_OUTPATIENT_CLINIC_OR_DEPARTMENT_OTHER): Payer: BC Managed Care – PPO | Admitting: Hematology & Oncology

## 2013-03-19 ENCOUNTER — Ambulatory Visit (HOSPITAL_BASED_OUTPATIENT_CLINIC_OR_DEPARTMENT_OTHER): Payer: BC Managed Care – PPO

## 2013-03-19 ENCOUNTER — Encounter: Payer: Self-pay | Admitting: Hematology & Oncology

## 2013-03-19 ENCOUNTER — Other Ambulatory Visit (HOSPITAL_BASED_OUTPATIENT_CLINIC_OR_DEPARTMENT_OTHER): Payer: BC Managed Care – PPO | Admitting: Lab

## 2013-03-19 ENCOUNTER — Ambulatory Visit (HOSPITAL_COMMUNITY)
Admission: RE | Admit: 2013-03-19 | Discharge: 2013-03-19 | Disposition: A | Discharge status: Home / Self Care | Payer: BC Managed Care – PPO | Source: Ambulatory Visit | Attending: Hematology & Oncology | Admitting: Hematology & Oncology

## 2013-03-19 VITALS — BP 155/90 | HR 115 | Temp 97.5°F | Resp 14 | Ht 62.0 in | Wt 152.0 lb

## 2013-03-19 VITALS — BP 153/95 | HR 97 | Temp 98.1°F | Resp 16

## 2013-03-19 DIAGNOSIS — C3490 Malignant neoplasm of unspecified part of unspecified bronchus or lung: Secondary | ICD-10-CM

## 2013-03-19 DIAGNOSIS — R5381 Other malaise: Secondary | ICD-10-CM

## 2013-03-19 DIAGNOSIS — R5383 Other fatigue: Secondary | ICD-10-CM

## 2013-03-19 DIAGNOSIS — C3430 Malignant neoplasm of lower lobe, unspecified bronchus or lung: Secondary | ICD-10-CM

## 2013-03-19 DIAGNOSIS — C7951 Secondary malignant neoplasm of bone: Secondary | ICD-10-CM

## 2013-03-19 DIAGNOSIS — C50419 Malignant neoplasm of upper-outer quadrant of unspecified female breast: Secondary | ICD-10-CM

## 2013-03-19 DIAGNOSIS — D649 Anemia, unspecified: Secondary | ICD-10-CM | POA: Insufficient documentation

## 2013-03-19 DIAGNOSIS — C7952 Secondary malignant neoplasm of bone marrow: Secondary | ICD-10-CM

## 2013-03-19 DIAGNOSIS — C50919 Malignant neoplasm of unspecified site of unspecified female breast: Secondary | ICD-10-CM

## 2013-03-19 DIAGNOSIS — R110 Nausea: Secondary | ICD-10-CM

## 2013-03-19 LAB — CBC WITH DIFFERENTIAL (CANCER CENTER ONLY)
BASO#: 0.1 10*3/uL (ref 0.0–0.2)
BASO%: 0.6 % (ref 0.0–2.0)
EOS ABS: 0 10*3/uL (ref 0.0–0.5)
EOS%: 0.1 % (ref 0.0–7.0)
HEMATOCRIT: 25.2 % — AB (ref 34.8–46.6)
HEMOGLOBIN: 7.8 g/dL — AB (ref 11.6–15.9)
LYMPH#: 0.7 10*3/uL — ABNORMAL LOW (ref 0.9–3.3)
LYMPH%: 8.5 % — AB (ref 14.0–48.0)
MCH: 32.4 pg (ref 26.0–34.0)
MCHC: 31 g/dL — ABNORMAL LOW (ref 32.0–36.0)
MCV: 105 fL — AB (ref 81–101)
MONO#: 0.9 10*3/uL (ref 0.1–0.9)
MONO%: 12 % (ref 0.0–13.0)
NEUT#: 6.1 10*3/uL (ref 1.5–6.5)
NEUT%: 78.8 % (ref 39.6–80.0)
PLATELETS: 593 10*3/uL — AB (ref 145–400)
RBC: 2.41 10*6/uL — AB (ref 3.70–5.32)
RDW: 21.1 % — ABNORMAL HIGH (ref 11.1–15.7)
WBC: 7.7 10*3/uL (ref 3.9–10.0)

## 2013-03-19 LAB — CMP (CANCER CENTER ONLY)
ALK PHOS: 144 U/L — AB (ref 26–84)
ALT(SGPT): 16 U/L (ref 10–47)
AST: 33 U/L (ref 11–38)
Albumin: 3.3 g/dL (ref 3.3–5.5)
BILIRUBIN TOTAL: 0.5 mg/dL (ref 0.20–1.60)
BUN, Bld: 18 mg/dL (ref 7–22)
CO2: 31 meq/L (ref 18–33)
CREATININE: 2.1 mg/dL — AB (ref 0.6–1.2)
Calcium: 9.9 mg/dL (ref 8.0–10.3)
Chloride: 100 mEq/L (ref 98–108)
GLUCOSE: 124 mg/dL — AB (ref 73–118)
Potassium: 4.9 mEq/L — ABNORMAL HIGH (ref 3.3–4.7)
Sodium: 140 mEq/L (ref 128–145)
Total Protein: 7.3 g/dL (ref 6.4–8.1)

## 2013-03-19 LAB — ABO/RH: ABO/RH(D): O POS

## 2013-03-19 LAB — PREPARE RBC (CROSSMATCH)

## 2013-03-19 LAB — HOLD TUBE, BLOOD BANK - CHCC SATELLITE

## 2013-03-19 MED ORDER — SODIUM CHLORIDE 0.9 % IV SOLN
Freq: Once | INTRAVENOUS | Status: AC
  Administered 2013-03-19: 09:00:00 via INTRAVENOUS

## 2013-03-19 MED ORDER — ONDANSETRON 8 MG PO TBDP: 8.0000 mg | ORAL_TABLET | Freq: Three times a day (TID) | ORAL | Status: DC | PRN

## 2013-03-19 MED ORDER — ACETAMINOPHEN 325 MG PO TABS
ORAL_TABLET | ORAL | Status: AC
  Filled 2013-03-19: qty 2

## 2013-03-19 MED ORDER — SODIUM CHLORIDE 0.9 % IJ SOLN
10.0000 mL | INTRAMUSCULAR | Status: DC | PRN
  Filled 2013-03-19: qty 10

## 2013-03-19 MED ORDER — CARBOPLATIN CHEMO INTRADERMAL TEST DOSE 100MCG/0.02ML
100.0000 ug | Freq: Once | INTRADERMAL | Status: AC
  Administered 2013-03-19: 100 ug via INTRADERMAL
  Filled 2013-03-19: qty 0.02

## 2013-03-19 MED ORDER — PEMETREXED DISODIUM CHEMO INJECTION 500 MG
400.0000 mg/m2 | Freq: Once | INTRAVENOUS | Status: DC
  Filled 2013-03-19: qty 28

## 2013-03-19 MED ORDER — DIPHENHYDRAMINE HCL 25 MG PO CAPS
25.0000 mg | ORAL_CAPSULE | Freq: Once | ORAL | Status: AC
  Administered 2013-03-19: 25 mg via ORAL

## 2013-03-19 MED ORDER — ONDANSETRON 16 MG/50ML IVPB (CHCC): 16.0000 mg | Freq: Once | INTRAVENOUS | Status: DC

## 2013-03-19 MED ORDER — CYANOCOBALAMIN 1000 MCG/ML IJ SOLN: 1000.0000 ug | Freq: Once | INTRAMUSCULAR | Status: DC

## 2013-03-19 MED ORDER — LORAZEPAM 0.5 MG PO TABS: 0.5000 mg | ORAL_TABLET | Freq: Once | ORAL | Status: DC

## 2013-03-19 MED ORDER — DEXAMETHASONE SODIUM PHOSPHATE 20 MG/5ML IJ SOLN
20.0000 mg | Freq: Once | INTRAMUSCULAR | Status: DC
Start: 2013-03-19 — End: 2013-03-19

## 2013-03-19 MED ORDER — FUROSEMIDE 10 MG/ML IJ SOLN
INTRAMUSCULAR | Status: AC
  Filled 2013-03-19: qty 4

## 2013-03-19 MED ORDER — SODIUM CHLORIDE 0.9 % IV SOLN
300.0000 mg | Freq: Once | INTRAVENOUS | Status: DC
  Filled 2013-03-19: qty 30

## 2013-03-19 MED ORDER — DIPHENHYDRAMINE HCL 25 MG PO CAPS
ORAL_CAPSULE | ORAL | Status: AC
  Filled 2013-03-19: qty 1

## 2013-03-19 MED ORDER — METHYLPHENIDATE HCL 5 MG PO TABS: ORAL_TABLET | ORAL | Status: DC

## 2013-03-19 MED ORDER — FUROSEMIDE 10 MG/ML IJ SOLN
20.0000 mg | Freq: Once | INTRAMUSCULAR | Status: AC
  Administered 2013-03-19: 15:00:00 via INTRAVENOUS

## 2013-03-19 MED ORDER — ACETAMINOPHEN 325 MG PO TABS
650.0000 mg | ORAL_TABLET | Freq: Once | ORAL | Status: AC
  Administered 2013-03-19: 650 mg via ORAL

## 2013-03-19 MED ORDER — SODIUM CHLORIDE 0.9 % IV SOLN
250.0000 mL | Freq: Once | INTRAVENOUS | Status: AC
  Administered 2013-03-19: 250 mL via INTRAVENOUS

## 2013-03-19 MED ORDER — HEPARIN SOD (PORK) LOCK FLUSH 100 UNIT/ML IV SOLN
500.0000 [IU] | Freq: Once | INTRAVENOUS | Status: DC | PRN
  Filled 2013-03-19: qty 5

## 2013-03-19 NOTE — Progress Notes (Signed)
Pt. Sent to infusion area with orders to give chemo.  Test dose of carboplatin was given and then Dr. Marin Olp decided to hold chemo today and give 2 Units PRBC.

## 2013-03-19 NOTE — Patient Instructions (Signed)
Blood Transfusion  A blood transfusion replaces your blood or some of its parts. Blood is replaced when you have lost blood because of surgery, an accident, or for severe blood conditions like anemia. You can donate blood to be used on yourself if you have a planned surgery. If you lose blood during that surgery, your own blood can be given back to you. Any blood given to you is checked to make sure it matches your blood type. Your temperature, blood pressure, and heart rate (vital signs) will be checked often.  GET HELP RIGHT AWAY IF:   You feel sick to your stomach (nauseous) or throw up (vomit).  You have watery poop (diarrhea).  You have shortness of breath or trouble breathing.  You have blood in your pee (urine) or have dark colored pee.  You have chest pain or tightness.  Your eyes or skin turn yellow (jaundice).  You have a temperature by mouth above 102 F (38.9 C), not controlled by medicine.  You start to shake and have chills.  You develop a a red rash (hives) or feel itchy.  You develop lightheadedness or feel confused.  You develop back, joint, or muscle pain.  You do not feel hungry (lost appetite).  You feel tired, restless, or nervous.  You develop belly (abdominal) cramps. Document Released: 03/31/2008 Document Revised: 03/27/2011 Document Reviewed: 03/31/2008 [employer] Patient Information 2014 [employer].  

## 2013-03-20 ENCOUNTER — Encounter: Payer: Self-pay | Admitting: Hematology & Oncology

## 2013-03-20 LAB — TYPE AND SCREEN
ABO/RH(D): O POS
Antibody Screen: NEGATIVE
UNIT DIVISION: 0
Unit division: 0

## 2013-03-20 LAB — RETICULOCYTES (CHCC)
ABS Retic: 98.8 10*3/uL (ref 19.0–186.0)
RBC.: 2.47 MIL/uL — ABNORMAL LOW (ref 3.87–5.11)
Retic Ct Pct: 4 % — ABNORMAL HIGH (ref 0.4–2.3)

## 2013-03-20 LAB — ERYTHROPOIETIN: Erythropoietin: 71.5 m[IU]/mL — ABNORMAL HIGH (ref 2.6–18.5)

## 2013-03-20 NOTE — Progress Notes (Signed)
  DIAGNOSIS:  Metastatic adenocarcinoma of the lung-EGFR positive  Invasive lobular carcinoma the right breast   CURRENT THERAPY: Carboplatinum/Alimta/Avastin-on hold due to toxicity now Zometa 4 mg IV every 3 weeks-held for renal function Aromasin 25 mg by mouth daily   INTERIM HISTORY:  [name] comes in for followup. She just does not look that good today. She looks pale. She is more fatigued. She's having more nausea. She is still working but is more fatigued. She has lost weight. Her appetite is decreased. She's had no diarrhea. She's had no cough. She's had no leg swelling. There's been no headache.    I think that the chemotherapy is starting to "catch up to her".  Her performance status is ECoG 2   PHYSICAL EXAMINATION:  Slightly ill-appearing white female. No obvious distress. Vital signs 97 15. Blood pressure 155/90. Pulse 1:15. Weight 152 pounds. Head and neck exam shows some pale scleral. Oral mucosa slightly dry. No adenopathy in the neck. Lungs clear. Cardiac exam tachycardic regular. 1/6 murmur. Abdomen soft. No fluid wave. No liver edge. No spleen tip. No guarding. Back exam no tenderness over the spine. Extremities shows some trace edema in her legs. Skin exam is pale. No rashes. Neurological exam no focal neurological deficits.   LABORATORY STUDIES: White cell count 7.7. Hemoglobin 7.8. Platelet count 593. BUN 18 creatinine 2.1. Calcium 9.9 with an albumin of 3.3. Erythropoietin 71.5 reticulocyte count, corrected, 1.8.  IMPRESSION:  [name] is a 58 year old with metastatic adenocarcinoma of the lung. She progressed through targeted therapy. She now is on chemotherapy. She had a initial response and we got her on maintenance therapy. She then progressed fairly quickly. We now have her back on chemotherapy. We held her Avastin last time because of blood pressure.    I think we've had to hold chemotherapy now. I think that she started have side effects.  Her kidney function is lessening.    We will have to give her blood. This will make her feel better. I don't think it Aranesp will help that much.    At this time to rescan her. We have to see what her scans look like. I suspect that we're going to have to change her therapy on her.    She's done really well. Again I think that we are seeing side effects of chemotherapy.    We will get scans in 2 weeks. I'll see her back in 3 weeks.    Will hold Zometa today.    I spent 45 minutes with she and her husband discussing the changes status while we have to make these changes.   Volanda Napoleon, MD 03/20/2013

## 2013-03-21 NOTE — Addendum Note (Signed)
Addended by: Burney Gauze R on: 03/21/2013 04:29 PM   Modules accepted: Orders

## 2013-03-24 ENCOUNTER — Telehealth: Payer: Self-pay | Admitting: Hematology & Oncology

## 2013-03-24 NOTE — Telephone Encounter (Signed)
Pt aware of 3-19 [place] and mammogram. She is aware of 3-20 CT to be NPO 4 hrs and to drink contrast. She knows about 4-1 MD appointment

## 2013-03-25 ENCOUNTER — Encounter: Payer: Self-pay | Admitting: Nurse Practitioner

## 2013-03-25 NOTE — Progress Notes (Signed)
Spoke with [facility] for urgent [facility] for zofran 8mg  and authorization approved until 01/31/2014 auth # [id number]. Rx faxed back to [facility] in [facility] and pt notified.

## 2013-03-27 ENCOUNTER — Telehealth: Payer: Self-pay | Admitting: Hematology & Oncology

## 2013-03-27 ENCOUNTER — Other Ambulatory Visit: Payer: BC Managed Care – PPO

## 2013-03-27 NOTE — Telephone Encounter (Signed)
Manuela Schwartz from [employer] called pt's crea was high and they postponed her MRI. Per Dr. Marin Olp he still wants pt to get CT even if it's without IV contrast. Pt aware

## 2013-03-31 ENCOUNTER — Ambulatory Visit: Payer: BC Managed Care – PPO

## 2013-03-31 ENCOUNTER — Ambulatory Visit: Payer: BC Managed Care – PPO | Admitting: Radiation Oncology

## 2013-04-03 ENCOUNTER — Ambulatory Visit: Payer: BC Managed Care – PPO | Admitting: Hematology & Oncology

## 2013-04-03 ENCOUNTER — Ambulatory Visit
Admission: RE | Admit: 2013-04-03 | Discharge: 2013-04-03 | Disposition: A | Discharge status: Home / Self Care | Payer: BC Managed Care – PPO | Source: Ambulatory Visit | Attending: Hematology & Oncology | Admitting: Hematology & Oncology

## 2013-04-03 ENCOUNTER — Ambulatory Visit: Payer: BC Managed Care – PPO

## 2013-04-03 ENCOUNTER — Other Ambulatory Visit: Payer: BC Managed Care – PPO | Admitting: Lab

## 2013-04-03 ENCOUNTER — Other Ambulatory Visit: Payer: Self-pay | Admitting: Hematology & Oncology

## 2013-04-03 DIAGNOSIS — C50919 Malignant neoplasm of unspecified site of unspecified female breast: Secondary | ICD-10-CM

## 2013-04-03 DIAGNOSIS — C3490 Malignant neoplasm of unspecified part of unspecified bronchus or lung: Secondary | ICD-10-CM

## 2013-04-04 ENCOUNTER — Ambulatory Visit (HOSPITAL_BASED_OUTPATIENT_CLINIC_OR_DEPARTMENT_OTHER)
Admission: RE | Admit: 2013-04-04 | Discharge: 2013-04-04 | Disposition: A | Discharge status: Home / Self Care | Payer: BC Managed Care – PPO | Source: Ambulatory Visit | Attending: Hematology & Oncology | Admitting: Hematology & Oncology

## 2013-04-04 DIAGNOSIS — Z85118 Personal history of other malignant neoplasm of bronchus and lung: Secondary | ICD-10-CM | POA: Insufficient documentation

## 2013-04-04 DIAGNOSIS — C3490 Malignant neoplasm of unspecified part of unspecified bronchus or lung: Secondary | ICD-10-CM

## 2013-04-04 DIAGNOSIS — C50919 Malignant neoplasm of unspecified site of unspecified female breast: Secondary | ICD-10-CM

## 2013-04-07 ENCOUNTER — Other Ambulatory Visit: Payer: Self-pay | Admitting: Hematology & Oncology

## 2013-04-16 ENCOUNTER — Encounter: Payer: Self-pay | Admitting: Specialist

## 2013-04-16 ENCOUNTER — Ambulatory Visit (HOSPITAL_BASED_OUTPATIENT_CLINIC_OR_DEPARTMENT_OTHER): Payer: BC Managed Care – PPO | Admitting: Hematology & Oncology

## 2013-04-16 ENCOUNTER — Other Ambulatory Visit: Payer: Self-pay | Admitting: Hematology & Oncology

## 2013-04-16 ENCOUNTER — Ambulatory Visit: Payer: BC Managed Care – PPO

## 2013-04-16 ENCOUNTER — Ambulatory Visit
Admission: RE | Admit: 2013-04-16 | Discharge: 2013-04-16 | Disposition: A | Discharge status: Home / Self Care | Payer: BC Managed Care – PPO | Source: Ambulatory Visit | Attending: Hematology & Oncology | Admitting: Hematology & Oncology

## 2013-04-16 ENCOUNTER — Other Ambulatory Visit (HOSPITAL_BASED_OUTPATIENT_CLINIC_OR_DEPARTMENT_OTHER): Payer: BC Managed Care – PPO | Admitting: Lab

## 2013-04-16 VITALS — BP 163/99 | HR 124 | Temp 98.0°F | Resp 16

## 2013-04-16 DIAGNOSIS — C3490 Malignant neoplasm of unspecified part of unspecified bronchus or lung: Secondary | ICD-10-CM

## 2013-04-16 DIAGNOSIS — C7952 Secondary malignant neoplasm of bone marrow: Secondary | ICD-10-CM

## 2013-04-16 DIAGNOSIS — C3430 Malignant neoplasm of lower lobe, unspecified bronchus or lung: Secondary | ICD-10-CM

## 2013-04-16 DIAGNOSIS — C50919 Malignant neoplasm of unspecified site of unspecified female breast: Secondary | ICD-10-CM

## 2013-04-16 DIAGNOSIS — C7951 Secondary malignant neoplasm of bone: Secondary | ICD-10-CM

## 2013-04-16 DIAGNOSIS — IMO0002 Reserved for concepts with insufficient information to code with codable children: Secondary | ICD-10-CM

## 2013-04-16 DIAGNOSIS — C50419 Malignant neoplasm of upper-outer quadrant of unspecified female breast: Secondary | ICD-10-CM

## 2013-04-16 DIAGNOSIS — M5416 Radiculopathy, lumbar region: Secondary | ICD-10-CM

## 2013-04-16 DIAGNOSIS — C7800 Secondary malignant neoplasm of unspecified lung: Secondary | ICD-10-CM

## 2013-04-16 LAB — CMP (CANCER CENTER ONLY)
ALBUMIN: 3 g/dL — AB (ref 3.3–5.5)
ALT(SGPT): 32 U/L (ref 10–47)
AST: 38 U/L (ref 11–38)
Alkaline Phosphatase: 168 U/L — ABNORMAL HIGH (ref 26–84)
BUN, Bld: 23 mg/dL — ABNORMAL HIGH (ref 7–22)
CALCIUM: 9.9 mg/dL (ref 8.0–10.3)
CHLORIDE: 98 meq/L (ref 98–108)
CO2: 27 meq/L (ref 18–33)
CREATININE: 1.9 mg/dL — AB (ref 0.6–1.2)
GLUCOSE: 118 mg/dL (ref 73–118)
POTASSIUM: 4.2 meq/L (ref 3.3–4.7)
Sodium: 139 mEq/L (ref 128–145)
Total Bilirubin: 0.5 mg/dl (ref 0.20–1.60)
Total Protein: 8.5 g/dL — ABNORMAL HIGH (ref 6.4–8.1)

## 2013-04-16 LAB — CBC WITH DIFFERENTIAL (CANCER CENTER ONLY)
BASO#: 0 10*3/uL (ref 0.0–0.2)
BASO%: 0.1 % (ref 0.0–2.0)
EOS%: 0.1 % (ref 0.0–7.0)
Eosinophils Absolute: 0 10*3/uL (ref 0.0–0.5)
HEMATOCRIT: 30.8 % — AB (ref 34.8–46.6)
HGB: 10 g/dL — ABNORMAL LOW (ref 11.6–15.9)
LYMPH#: 0.8 10*3/uL — ABNORMAL LOW (ref 0.9–3.3)
LYMPH%: 6.8 % — AB (ref 14.0–48.0)
MCH: 32.6 pg (ref 26.0–34.0)
MCHC: 32.5 g/dL (ref 32.0–36.0)
MCV: 100 fL (ref 81–101)
MONO#: 1.2 10*3/uL — AB (ref 0.1–0.9)
MONO%: 10.8 % (ref 0.0–13.0)
NEUT#: 9.2 10*3/uL — ABNORMAL HIGH (ref 1.5–6.5)
NEUT%: 82.2 % — ABNORMAL HIGH (ref 39.6–80.0)
PLATELETS: 399 10*3/uL (ref 145–400)
RBC: 3.07 10*6/uL — ABNORMAL LOW (ref 3.70–5.32)
RDW: 15.3 % (ref 11.1–15.7)
WBC: 11.1 10*3/uL — ABNORMAL HIGH (ref 3.9–10.0)

## 2013-04-16 LAB — PREALBUMIN: PREALBUMIN: 20.4 mg/dL (ref 17.0–34.0)

## 2013-04-16 LAB — HOLD TUBE, BLOOD BANK - CHCC SATELLITE

## 2013-04-16 MED ORDER — DEXAMETHASONE 4 MG PO TABS: ORAL_TABLET | ORAL | Status: DC

## 2013-04-16 MED ORDER — METHYLPHENIDATE HCL 5 MG PO TABS: ORAL_TABLET | ORAL | Status: DC

## 2013-04-16 MED ORDER — HYDROCODONE-ACETAMINOPHEN 10-325 MG PO TABS: 1.0000 | ORAL_TABLET | Freq: Four times a day (QID) | ORAL | Status: AC | PRN

## 2013-04-16 NOTE — Patient Instructions (Signed)
Docetaxel injection What is this medicine? DOCETAXEL (doe se TAX el) is a chemotherapy drug. It targets fast dividing cells, like cancer cells, and causes these cells to die. This medicine is used to treat many types of cancers like breast cancer, certain stomach cancers, head and neck cancer, lung cancer, and prostate cancer. This medicine may be used for other purposes; ask your health care provider or pharmacist if you have questions. COMMON BRAND NAME(S): Docefrez , Taxotere What should I tell my health care provider before I take this medicine? They need to know if you have any of these conditions: -infection (especially a virus infection such as chickenpox, cold sores, or herpes) -liver disease -low blood counts, like low white cell, platelet, or red cell counts -an unusual or allergic reaction to docetaxel, polysorbate 80, other chemotherapy agents, other medicines, foods, dyes, or preservatives -pregnant or trying to get pregnant -breast-feeding How should I use this medicine? This drug is given as an infusion into a vein. It is administered in a hospital or clinic by a specially trained health care professional. Talk to your pediatrician regarding the use of this medicine in children. Special care may be needed. Overdosage: If you think you have taken too much of this medicine contact a poison control center or emergency room at once. NOTE: This medicine is only for you. Do not share this medicine with others. What if I miss a dose? It is important not to miss your dose. Call your doctor or health care professional if you are unable to keep an appointment. What may interact with this medicine? -cyclosporine -erythromycin -ketoconazole -medicines to increase blood counts like filgrastim, pegfilgrastim, sargramostim -vaccines Talk to your doctor or health care professional before taking any of these medicines: -acetaminophen -aspirin -ibuprofen -ketoprofen -naproxen This list  may not describe all possible interactions. Give your health care provider a list of all the medicines, herbs, non-prescription drugs, or dietary supplements you use. Also tell them if you smoke, drink alcohol, or use illegal drugs. Some items may interact with your medicine. What should I watch for while using this medicine? Your condition will be monitored carefully while you are receiving this medicine. You will need important blood work done while you are taking this medicine. This drug may make you feel generally unwell. This is not uncommon, as chemotherapy can affect healthy cells as well as cancer cells. Report any side effects. Continue your course of treatment even though you feel ill unless your doctor tells you to stop. In some cases, you may be given additional medicines to help with side effects. Follow all directions for their use. Call your doctor or health care professional for advice if you get a fever, chills or sore throat, or other symptoms of a cold or flu. Do not treat yourself. This drug decreases your body's ability to fight infections. Try to avoid being around people who are sick. This medicine may increase your risk to bruise or bleed. Call your doctor or health care professional if you notice any unusual bleeding. Be careful brushing and flossing your teeth or using a toothpick because you may get an infection or bleed more easily. If you have any dental work done, tell your dentist you are receiving this medicine. Avoid taking products that contain aspirin, acetaminophen, ibuprofen, naproxen, or ketoprofen unless instructed by your doctor. These medicines may hide a fever. Do not become pregnant while taking this medicine. Women should inform their doctor if they wish to become pregnant or think   they might be pregnant. There is a potential for serious side effects to an unborn child. Talk to your health care professional or pharmacist for more information. Do not breast-feed an  infant while taking this medicine. What side effects may I notice from receiving this medicine? Side effects that you should report to your doctor or health care professional as soon as possible: -allergic reactions like skin rash, itching or hives, swelling of the face, lips, or tongue -low blood counts - This drug may decrease the number of white blood cells, red blood cells and platelets. You may be at increased risk for infections and bleeding. -signs of infection - fever or chills, cough, sore throat, pain or difficulty passing urine -signs of decreased platelets or bleeding - bruising, pinpoint red spots on the skin, black, tarry stools, nosebleeds -signs of decreased red blood cells - unusually weak or tired, fainting spells, lightheadedness -breathing problems -fast or irregular heartbeat -low blood pressure -mouth sores -nausea and vomiting -pain, swelling, redness or irritation at the injection site -pain, tingling, numbness in the hands or feet -swelling of the ankle, feet, hands -weight gain Side effects that usually do not require medical attention (report to your prescriber or health care professional if they continue or are bothersome): -bone pain -complete hair loss including hair on your head, underarms, pubic hair, eyebrows, and eyelashes -diarrhea -excessive tearing -changes in the color of fingernails -loosening of the fingernails -nausea -muscle pain -red flush to skin -sweating -weak or tired This list may not describe all possible side effects. Call your doctor for medical advice about side effects. You may report side effects to FDA at 1-800-FDA-1088. Where should I keep my medicine? This drug is given in a hospital or clinic and will not be stored at home. NOTE: This sheet is a summary. It may not cover all possible information. If you have questions about this medicine, talk to your doctor, pharmacist, or health care provider.  2014, [employer]vier/Gold Standard.  (2007-12-16 11:52:10)

## 2013-04-16 NOTE — Progress Notes (Signed)
Patient came to meet me in my office. Chaplain helped her strengthen her ability to respond constructively to the possibility that there will not be a cure for her cancer through helping her apply her strong Christian faith. Listened to her grief over how dying will end some of her relationships with significant others; explored with her ways that she can find meaning in and important work with these relationships in whatever time she has. Facilitated her focus on the present. Empathetic listening; prayer.  Epifania Gore, Bonney Roussel, [facility], PhD

## 2013-04-17 NOTE — Progress Notes (Signed)
Hematology and Oncology Follow Up Visit  [name] [id number] 08/14/1955 58 y.o. 04/17/2013   Principle Diagnosis:   Metastatic adenocarcinoma of the lung-EGFR positive  Invasive lobular carcinoma the right breast  Current Therapy:    Patient will start Taxotere/Cyramza  Aromasin 25 mg by mouth daily and  Zometa 4 mg IV every 3 weeks     Interim History:  [name] is back for followup. Unfortunately, she isn't progressing now. We did go ahead and get a CT scan on her. This showed the following:      She's feeling better. We did give her blood transfusion when we last saw her. We held her Zometa because of her kidneys.  She is complaining of some back discomfort. She does have pain down the right leg. He  We did go ahead and get a MRI. The MRI d:   I will refer her to radiation oncology for this.  She's had no headaches. She's had no bleeding or bruising. She's had no fever sweats or chills.  Her appetite is better. Her taste for food is better.  Overall, her performance status is ECOG 1    Medications: Current outpatient prescriptions:ALPRAZolam (XANAX) 0.25 MG tablet, Take 0.25-0.5 mg by mouth as needed. For anxiety., Disp: , Rfl: ;  amLODipine (NORVASC) 2.5 MG tablet, Take 2.5 mg by mouth daily., Disp: , Rfl: ;  exemestane (AROMASIN) 25 MG tablet, Take 1 tablet (25 mg total) by mouth daily after breakfast., Disp: 30 tablet, Rfl: 6 HYDROcodone-acetaminophen (NORCO) 10-325 MG per tablet, Take 1 tablet by mouth every 6 (six) hours as needed., Disp: 120 tablet, Rfl: 0;  ibuprofen (ADVIL,MOTRIN) 200 MG tablet, Take 600 mg by mouth every 6 (six) hours as needed for pain. , Disp: , Rfl: ;  lidocaine-prilocaine (EMLA) cream, Apply topically as needed. , Disp: , Rfl: ;  loperamide (IMODIUM) 2 MG capsule, Take 2 mg by mouth 4 (four) times daily as needed. For diarrhea, Disp: , Rfl:  LORazepam (ATIVAN) 0.5 MG tablet, , Disp: , Rfl: ;  methylphenidate (RITALIN) 5 MG  tablet, Take 1 pill TWICE a day., Disp: 60 tablet, Rfl: 0;  naphazoline (NAPHCON) 0.1 % ophthalmic solution, Place 1 drop into both eyes 4 (four) times daily as needed., Disp: 15 mL, Rfl: 2;  ondansetron (ZOFRAN ODT) 8 MG disintegrating tablet, Take 1 tablet (8 mg total) by mouth every 8 (eight) hours as needed for nausea or vomiting., Disp: 20 tablet, Rfl: 4 ondansetron (ZOFRAN) 8 MG tablet, Take 8 mg by mouth 2 (two) times daily. Take two times a day starting the day after chemo for 3 days. ., Disp: , Rfl: ;  predniSONE (DELTASONE) 20 MG tablet, Take 20 mg by mouth. Take as directed, Disp: , Rfl: ;  Probiotic Product (ALIGN) 4 MG CAPS, Take by mouth every morning., Disp: , Rfl:  prochlorperazine (COMPAZINE) 10 MG tablet, Take 10 mg by mouth every 6 (six) hours as needed (Nausea or vomiting)., Disp: , Rfl: ;  prochlorperazine (COMPAZINE) 10 MG tablet, TAKE 1 TABLET (10 MG TOTAL) BY MOUTH EVERY 6 (SIX) HOURS AS NEEDED FOR NAUSEA OR VOMITING., Disp: 30 tablet, Rfl: 1;  zolendronic acid (ZOMETA) 4 MG/5ML injection, Inject 4 mg into the vein every 30 (thirty) days., Disp: , Rfl:  dexamethasone (DECADRON) 4 MG tablet, Take 2 pills twice a day for 5 days.  Start day before chemo and take for 5 days only., Disp: 60 tablet, Rfl: 3 No current facility-administered medications for this  visit. Facility-Administered Medications Ordered in Other Visits: sodium chloride 0.9 % injection 10 mL, 10 mL, Intracatheter, PRN, Volanda Napoleon, MD, 10 mL at 09/04/12 1511  Allergies: No Known Allergies  Past Medical History, Surgical history, Social history, and Family History were reviewed and updated.  Review of Systems: As above  Physical Exam:  oral temperature is 98 F (36.7 C). Her blood pressure is 163/99 and her pulse is 124. Her respiration is 16.   Well-developed well-nourished white female. Lungs are clear. Cardiac exam regular in rhythm. No murmurs rubs or bruits. Abdomen is soft. Has good bowel sounds. There  is no fluid wave. There is a palpable liver or spleen tip. Back exam shows slight tenderness in the lumbosacral spine. Extremities shows good strength in her legs. She has good range of motion of her joints. Skin exam no rashes. Lymph node examination is no palpable lymphadenopathy. Oral exam shows no mucositis. Neurological exam shows no focal neurological deficits.  Lab Results  Component Value Date   WBC 11.1* 04/16/2013   HGB 10.0* 04/16/2013   HCT 30.8* 04/16/2013   MCV 100 04/16/2013   PLT 399 04/16/2013     Chemistry      Component Value Date/Time   NA 139 04/16/2013 0932   NA 143 08/14/2012 0907   K 4.2 04/16/2013 0932   K 3.9 08/14/2012 0907   CL 98 04/16/2013 0932   CL 106 08/14/2012 0907   CO2 27 04/16/2013 0932   CO2 25 08/14/2012 0907   BUN 23* 04/16/2013 0932   BUN 10 08/14/2012 0907   CREATININE 1.9* 04/16/2013 0932   CREATININE 0.71 08/14/2012 0907      Component Value Date/Time   CALCIUM 9.9 04/16/2013 0932   CALCIUM 9.0 08/14/2012 0907   ALKPHOS 168* 04/16/2013 0932   ALKPHOS 270* 08/14/2012 0907   AST 38 04/16/2013 0932   AST 31 08/14/2012 0907   ALT 32 04/16/2013 0932   ALT 37* 08/14/2012 0907   BILITOT 0.50 04/16/2013 0932   BILITOT 0.2* 08/14/2012 0907         Impression and Plan: [name] is 58 year old white female. She is metastatic adenocarcinoma of the lung. She's EGFR positive. She's already been through [place] and GILOTRIF.  She has been on chemotherapy with carboplatin and Alimta with Avastin.  At the we now have to go to Chemotherapy. She still has a good performance status.  Talked to her and her husband about this. I explained to them both normal. I spent a good 40-45 minutes with them.  I still think that she could tolerate treatment.  She wants to wait after Easter. She and her family will be going to the beach afterwards. She wants to enjoy this. I did not see a problem with this.  I called radiation oncology. They will see her. I think that the MRI likely does  show where she is having problems with this radicular pain. I think a short course of radiation probably would be appropriate before we start systemic chemotherapy.  We'll go ahead and plan to get started with chemotherapy in a couple weeks or so. Says that she does not have a lot of progressive disease, we have luxury  of being able to start then.  I'll plan to see her back for her second cycle of treatment. We will then plan for scans at work. Volanda Napoleon, MD 4/2/20156:12 PM

## 2013-04-18 ENCOUNTER — Telehealth: Payer: Self-pay | Admitting: Hematology & Oncology

## 2013-04-18 ENCOUNTER — Other Ambulatory Visit: Payer: BC Managed Care – PPO

## 2013-04-18 NOTE — Telephone Encounter (Signed)
Per in basket start tx 4-20 pt aware of 4-23 tx her husband is having surgery she can't come until 4-23. RN aware

## 2013-04-21 ENCOUNTER — Ambulatory Visit: Payer: BC Managed Care – PPO

## 2013-04-21 ENCOUNTER — Ambulatory Visit: Payer: BC Managed Care – PPO | Admitting: Radiation Oncology

## 2013-04-21 ENCOUNTER — Other Ambulatory Visit: Payer: Self-pay | Admitting: Radiation Therapy

## 2013-04-21 DIAGNOSIS — C7951 Secondary malignant neoplasm of bone: Secondary | ICD-10-CM

## 2013-04-21 DIAGNOSIS — C7952 Secondary malignant neoplasm of bone marrow: Principal | ICD-10-CM

## 2013-04-21 NOTE — Progress Notes (Addendum)
Radiation Oncology         [phone] ________________________________  Outpatient Re-Consultation  Name: [name] MRN: [id number]  Date: 04/23/2013  DOB: July 27, 1955  ZC:HYIFOY, Annie Main, MD  Volanda Napoleon, MD   REFERRING PHYSICIAN: Volanda Napoleon, MD  DIAGNOSIS: Adenocarcinoma of the lung, with brain/bone metastases  She also has early stage breast cancer managed with Aromasin  Radiation treatment dates:  12/30/2012  Right frontal 3.8 mm target was treated using 3 circular cone Arcs to a prescription dose of 20 Gy.  Right temporal 2.7 mm target was treated using 3 circular cone Arcs to a prescription dose of 20 Gy.   Of note, Dr Lisbeth Renshaw discussed possible treatment to 4 lesions in the brain conference. 2 of the 4 lesions had been treated previously. These are small lesions with a slight increase and these therefore will be followed on future scans.   She completed stereotactic radiosurgery to the Left temporal 42mm target, Left cerebellar 61mm target , Left posterior superior frontal 72mm target, Right anterior temporal 84mm target , Right superior vermian 24mm target , Left occipital 26mm target, all to 20 Gy in 07-26-12   She completed stereotactic radiosurgery to the right frontal, right thalamic, left frontal and left occipital target, 20 Gray in 1 fraction to each target, on 07/19/11.   07/05/2011-07/21/2011 - LS spine and thoracic spine (T7-T9) 30 Gy in 10 sessions    HISTORY OF PRESENT ILLNESS::[name] is a 58 y.o. female who is well known to me.  She has not been able to undergo MRI of the brain with contrast for this followup due to suboptimal renal function.  She did undergo MRI of the L spine wo contrast on 4-1 due to low back pain radiating to the right leg. It did not show a clear explanation for her pain, so a pelvic MRI was performed thereafter. This is notable for a right sacral lesion demonstrating mass effect on the S2 nerve root. This lesion is inferior  to the right sacral lesion previously irradiated 2 years ago at S1. I reviewed her imaging and plan from 2 years ago for her L-S spine.  Her pain radiates from below the buttock and down the posterior right leg. Charlie horse sensation in calf, she says.  Right foot has decreased sensation. No bowel or bladder complaints, except new, faint urinary straining today.  She is finishing a prednisone taper via her PCP. She is ambulatory.  PREVIOUS RADIATION THERAPY: Yes as above  PAST MEDICAL HISTORY:  has a past medical history of Pulmonary nodules (06/22/11); IBS (irritable bowel syndrome); Fibroadenoma of breast; radiation therapy (07/05/11 -07/21/11); radiation therapy ( completed7/3/13); Headache(784.0); Compression fracture; Status post chemotherapy (planned start 07/24/12); S/P radiation therapy (07/19/11); Brain lesion (06/2712); S/P radiation therapy ( 07/26/2012); radiation therapy (10/21/12); Shingles (01/02/13); Postmenopausal HRT (hormone replacement therapy); Metastasis of unknown origin; Adenocarcinoma of lung (Dx'd 06/2011); Metastatic adenocarcinoma to bone (dx'd 06/2011); Metastatic adenocarcinoma to brain (dx'd 06/2011); Breast cancer (01/04/12); Metastatic lung cancer; Breast cancer (01/29/2013); and S/P radiation therapy ( 12/30/2012).    PAST SURGICAL HISTORY: Past Surgical History  Procedure Laterality Date  . Lung biopsy    . Vertebroplasty    . Vertebroplasty    . Breast biopsy  2013    right breast  . Intrauterine device insertion  10/2005    Mirena was removed 01/24/11    FAMILY HISTORY: family history includes Asthma in her father; Cervical cancer (age of onset: 64) in her mother;  Heart disease in her maternal grandmother and paternal grandfather; Heart disease (age of onset: 66) in her mother; Hypertension in her father; Pancreatic cancer in her paternal grandmother; Skin cancer in her maternal grandfather; Throat cancer (age of onset: 68) in her brother.  SOCIAL HISTORY:  reports that she  has never smoked. She has never used smokeless tobacco. She reports that she drinks alcohol. She reports that she does not use illicit drugs.  ALLERGIES: Review of patient's allergies indicates no known allergies.  MEDICATIONS:  Current Outpatient Prescriptions  Medication Sig Dispense Refill  . ALPRAZolam (XANAX) 0.25 MG tablet Take 0.25-0.5 mg by mouth as needed. For anxiety.      Marland Kitchen amLODipine (NORVASC) 2.5 MG tablet Take 2.5 mg by mouth daily.      Marland Kitchen exemestane (AROMASIN) 25 MG tablet Take 1 tablet (25 mg total) by mouth daily after breakfast.  30 tablet  6  . HYDROcodone-acetaminophen (NORCO) 10-325 MG per tablet Take 1 tablet by mouth every 6 (six) hours as needed.  120 tablet  0  . ibuprofen (ADVIL,MOTRIN) 200 MG tablet Take 600 mg by mouth every 6 (six) hours as needed for pain.       Marland Kitchen lidocaine-prilocaine (EMLA) cream Apply topically as needed.       . loperamide (IMODIUM) 2 MG capsule Take 2 mg by mouth 4 (four) times daily as needed. For diarrhea      . LORazepam (ATIVAN) 0.5 MG tablet       . methylphenidate (RITALIN) 5 MG tablet Take 1 pill TWICE a day.  60 tablet  0  . naphazoline (NAPHCON) 0.1 % ophthalmic solution Place 1 drop into both eyes 4 (four) times daily as needed.  15 mL  2  . ondansetron (ZOFRAN ODT) 8 MG disintegrating tablet Take 1 tablet (8 mg total) by mouth every 8 (eight) hours as needed for nausea or vomiting.  20 tablet  4  . Probiotic Product (ALIGN) 4 MG CAPS Take by mouth every morning.      . prochlorperazine (COMPAZINE) 10 MG tablet TAKE 1 TABLET (10 MG TOTAL) BY MOUTH EVERY 6 (SIX) HOURS AS NEEDED FOR NAUSEA OR VOMITING.  30 tablet  1  . dexamethasone (DECADRON) 4 MG tablet Take 2 pills twice a day for 5 days.  Start day before chemo and take for 5 days only.  60 tablet  3  . dexamethasone (DECADRON) 4 MG tablet Take 1 tablet BID, except on days when taking this as prescribed for chemotherapy  60 tablet  0  . zolendronic acid (ZOMETA) 4 MG/5ML injection  Inject 4 mg into the vein every 30 (thirty) days.       No current facility-administered medications for this encounter.   Facility-Administered Medications Ordered in Other Encounters  Medication Dose Route Frequency Provider Last Rate Last Dose  . sodium chloride 0.9 % injection 10 mL  10 mL Intracatheter PRN Volanda Napoleon, MD   10 mL at 09/04/12 1511    REVIEW OF SYSTEMS:  Notable for that above.   PHYSICAL EXAM:  height is 5\' 2"  (1.575 m) and weight is 153 lb 4.8 oz (69.536 kg). Her temperature is 98.4 F (36.9 C). Her blood pressure is 147/94 and her pulse is 99.   Very Tender to palpation in right calf. No swelling of right or left legs.  No discernible weakness in legs or arms. Ambulatory. Tender in right S2 region to palpation.  ECOG = 1  0 - Asymptomatic (Fully active,  able to carry on all predisease activities without restriction)  1 - Symptomatic but completely ambulatory (Restricted in physically strenuous activity but ambulatory and able to carry out work of a light or sedentary nature. For example, light housework, office work)  2 - Symptomatic, <50% in bed during the day (Ambulatory and capable of all self care but unable to carry out any work activities. Up and about more than 50% of waking hours)  3 - Symptomatic, >50% in bed, but not bedbound (Capable of only limited self-care, confined to bed or chair 50% or more of waking hours)  4 - Bedbound (Completely disabled. Cannot carry on any self-care. Totally confined to bed or chair)  5 - Death   Eustace Pen MM, Creech RH, Tormey DC, et al. [phone]). "Toxicity and response criteria of the [facility]". [place] Oncol. 5 (6): 649-55   LABORATORY DATA:  Lab Results  Component Value Date   WBC 11.1* 04/16/2013   HGB 10.0* 04/16/2013   HCT 30.8* 04/16/2013   MCV 100 04/16/2013   PLT 399 04/16/2013   CMP     Component Value Date/Time   NA 139 04/16/2013 0932   NA 143 08/14/2012 0907   K 4.2 04/16/2013 0932     K 3.9 08/14/2012 0907   CL 98 04/16/2013 0932   CL 106 08/14/2012 0907   CO2 27 04/16/2013 0932   CO2 25 08/14/2012 0907   GLUCOSE 118 04/16/2013 0932   GLUCOSE 111* 08/14/2012 0907   BUN 23* 04/16/2013 0932   BUN 10 08/14/2012 0907   CREATININE 1.9* 04/16/2013 0932   CREATININE 0.71 08/14/2012 0907   CALCIUM 9.9 04/16/2013 0932   CALCIUM 9.0 08/14/2012 0907   PROT 8.5* 04/16/2013 0932   PROT 6.3 08/14/2012 0907   ALBUMIN 3.8 08/14/2012 0907   AST 38 04/16/2013 0932   AST 31 08/14/2012 0907   ALT 32 04/16/2013 0932   ALT 37* 08/14/2012 0907   ALKPHOS 168* 04/16/2013 0932   ALKPHOS 270* 08/14/2012 0907   BILITOT 0.50 04/16/2013 0932   BILITOT 0.2* 08/14/2012 0907   GFRNONAA >90 12/13/2011 0755   GFRAA >90 12/13/2011 0755     Estimated Creatinine Clearance: 29.1 ml/min (by C-G formula based on Cr of 1.9).    RADIOGRAPHY:  Mr Pelvis Wo Contrast  04/22/2013   CLINICAL DATA:  Metastatic lung cancer.  EXAM: MRI PELVIS WITHOUT CONTRAST  TECHNIQUE: Multiplanar multisequence MR imaging of the pelvis was performed. No intravenous contrast was administered.  COMPARISON:  CT scan 04/04/2013  FINDINGS: There is diffuse and relatively stable appearing sclerotic metastatic disease involving the lower spine and pelvis. There also lytic lesions involving the right aspect of the sacrum, the right acetabulum, the left ischium and the left femoral neck. No pathologic fracture is identified.  The right sacral lesion does have mass effect on the right S2 nerve root as it exits the neural foramen.  No significant intrapelvic abnormalities other than a small amount of free pelvic fluid. No intrapelvic mass or adenopathy. No inguinal adenopathy.  IMPRESSION: 1. Diffuse sclerotic and lytic metastatic bone disease involving the pelvis and hips. No definite pathologic fracture. 2. The right sacral lesion does have mass effect on the right S2 nerve root.   Electronically Signed   By: Kalman Jewels M.D.   On: 04/22/2013 15:15        IMPRESSION/PLAN: We discussed palliative RT to her right sacral lesion, which I suspect is causing her symptoms. This could  require [place] to reirradiate some of the tissues that were treated about 2 years ago when her S1 tumor was palliated. This could increase her risk of side effects, but I would keep the fields small to minimize risk as much as I can. I anticipate 10-12 treatments for her. She would like to proceed. Simulation to occur today, RT to start on 4-14.  We discussed the risks, benefits, and side effects of radiotherapy. No guarantees of treatment were given. A consent form was signed and placed in the patient's medical record. The patient is enthusiastic about proceeding with treatment. I look forward to participating in the patient's care.  Rx'd Decadron 4mg  BID to prevent further neurologic symptoms. Stop Prednisone. Duplex venous US ordered to rule out right LE DVT in light of exam.  I spent 25 minutes  face to face with the patient and more than 50% of that time was spent in counseling and/or coordination of care.    __________________________________________   Eppie Gibson, MD

## 2013-04-22 ENCOUNTER — Ambulatory Visit
Admission: RE | Admit: 2013-04-22 | Discharge: 2013-04-22 | Disposition: A | Discharge status: Home / Self Care | Payer: BC Managed Care – PPO | Source: Ambulatory Visit | Attending: Radiation Oncology | Admitting: Radiation Oncology

## 2013-04-22 ENCOUNTER — Ambulatory Visit: Payer: BC Managed Care – PPO | Admitting: Radiation Oncology

## 2013-04-22 DIAGNOSIS — C7951 Secondary malignant neoplasm of bone: Secondary | ICD-10-CM

## 2013-04-22 DIAGNOSIS — C7952 Secondary malignant neoplasm of bone marrow: Principal | ICD-10-CM

## 2013-04-23 ENCOUNTER — Ambulatory Visit
Admission: RE | Admit: 2013-04-23 | Discharge: 2013-04-23 | Disposition: A | Discharge status: Home / Self Care | Payer: BC Managed Care – PPO | Source: Ambulatory Visit | Attending: Radiation Oncology | Admitting: Radiation Oncology

## 2013-04-23 ENCOUNTER — Encounter: Payer: Self-pay | Admitting: Radiation Oncology

## 2013-04-23 VITALS — BP 147/94 | HR 99 | Temp 98.4°F | Ht 62.0 in | Wt 153.3 lb

## 2013-04-23 DIAGNOSIS — C7952 Secondary malignant neoplasm of bone marrow: Principal | ICD-10-CM

## 2013-04-23 DIAGNOSIS — M545 Low back pain, unspecified: Secondary | ICD-10-CM | POA: Insufficient documentation

## 2013-04-23 DIAGNOSIS — Z800 Family history of malignant neoplasm of digestive organs: Secondary | ICD-10-CM | POA: Insufficient documentation

## 2013-04-23 DIAGNOSIS — M79609 Pain in unspecified limb: Secondary | ICD-10-CM | POA: Insufficient documentation

## 2013-04-23 DIAGNOSIS — Z923 Personal history of irradiation: Secondary | ICD-10-CM | POA: Insufficient documentation

## 2013-04-23 DIAGNOSIS — C7951 Secondary malignant neoplasm of bone: Secondary | ICD-10-CM | POA: Insufficient documentation

## 2013-04-23 DIAGNOSIS — C3490 Malignant neoplasm of unspecified part of unspecified bronchus or lung: Secondary | ICD-10-CM | POA: Insufficient documentation

## 2013-04-23 DIAGNOSIS — Z510 Encounter for antineoplastic radiation therapy: Secondary | ICD-10-CM | POA: Insufficient documentation

## 2013-04-23 DIAGNOSIS — C50919 Malignant neoplasm of unspecified site of unspecified female breast: Secondary | ICD-10-CM | POA: Insufficient documentation

## 2013-04-23 DIAGNOSIS — Z8049 Family history of malignant neoplasm of other genital organs: Secondary | ICD-10-CM | POA: Insufficient documentation

## 2013-04-23 DIAGNOSIS — R209 Unspecified disturbances of skin sensation: Secondary | ICD-10-CM | POA: Insufficient documentation

## 2013-04-23 DIAGNOSIS — C7931 Secondary malignant neoplasm of brain: Secondary | ICD-10-CM | POA: Insufficient documentation

## 2013-04-23 DIAGNOSIS — C7949 Secondary malignant neoplasm of other parts of nervous system: Secondary | ICD-10-CM

## 2013-04-23 MED ORDER — DEXAMETHASONE 4 MG PO TABS: ORAL_TABLET | ORAL | Status: DC

## 2013-04-23 NOTE — Progress Notes (Signed)
Histology and Location of Primary Cancer: Adenocarcinoma of the Lung  Sites of Visceral and Bony Metastatic Disease:T10, T11, L1, L3. L4   1. Diffuse sclerotic and lytic metastatic bone disease involving the  pelvis and hips. No definite pathologic fracture.  2. The right sacral lesion does have mass effect on the right S2  nerve root.    Location(s) of Symptomatic Metastases:   Past/Anticipated chemotherapy by medical oncology, if any: Patient will start Taxotere/Cyramza.  Aromasin 25 mg by mouth daily and. Zometa 4 mg IV every 3 weeks        Pain on a scale of 0-10 is: 7/10-  Right posterior thigh and leg with numbness of right foot    If Spine Met(s), symptoms, if any, include:  Bowel/Bladder retention or incontinence (please describe): States tupically no difficulty voiding, but noted straining today  Numbness or weakness in extremities (please describe): Numbness in right foot, but tingling with pain in right leg.  Current Decadron regimen, if applicable: Decadron with chemotherapy   Ambulatory status? Walker? Wheelchair?: Limping when ambulating  SAFETY ISSUES: Risk to Fall  Prior radiation? Right frontal 3.8 mm target was treated using 3 circular cone Arcs to a prescription dose of 20 Gy.  Right temporal 2.7 mm target was treated using 3 circular cone Arcs to a prescription dose of 20 Gy.  2 of the 4 lesions had been treated previously. She completed stereotactic radiosurgery to the Left temporal 39m target, Left cerebellar 36mtarget , Left posterior superior frontal 76m83marget, Right anterior temporal 3mm78mrget , Right superior vermian 4mm 67mget , Left occipital 3mm t79met, all to 20 Gy in 07-26-12. She completed stereotactic radiosurgery to the right frontal, right thalamic, left frontal and left occipital target, 20 Gray in 1 fraction to each target, on 07/19/11.  07/05/2011-07/21/2011 - LS spine and thoracic spine (T7-T9) 30 Gy in 10 sessions     Pacemaker/ICD?  NO  Possible current pregnancy? NO  Is the patient on methotrexate? NO  Current Complaints / other details:

## 2013-04-23 NOTE — Progress Notes (Signed)
  Radiation Oncology         (336) [phone] ________________________________  Name: [name] MRN: [id number]  Date: 04/23/2013  DOB: [birth year]  SIMULATION AND TREATMENT PLANNING NOTE  outpatient  DIAGNOSIS:  Bone metastases, S- spine  NARRATIVE:  The patient was brought to the [facility].  Identity was confirmed.  All relevant records and images related to the planned course of therapy were reviewed.  The patient freely provided informed written consent to proceed with treatment after reviewing the details related to the planned course of therapy. The consent form was witnessed and verified by the simulation staff.    Then, the patient was set-up in a stable reproducible  supine position for radiation therapy.  CT images were obtained.  Surface markings were placed.  The CT images were loaded into the planning software.    TREATMENT PLANNING NOTE: Treatment planning then occurred.  The radiation prescription was entered and confirmed.    A total of 6 medically necessary complex treatment devices were fabricated and supervised by me - vaclock and 5 fields with MLCs to block bowel and previous tissues exposed to radiation. I have requested : Isodose Plan.   Special Treatment Procedure Note: The patient received prior radiotherapy close to her current fields. There could be some overlap of radiation dose.  Prior regional radiotherapy increases the risk of side effects from treatment. I have considered this in the treatment planning process and have aimed to minimize tissue overlap.  This increases the complexity of this patient's treatment and therefore this constitutes a special treatment procedure. I have ordered a composite plan.  The patient will receive 30 Gy in 10 to 12 fractions to her S2 Sacral lesion with 5 fields.  -----------------------------------  Eppie Gibson, MD

## 2013-04-24 ENCOUNTER — Ambulatory Visit (HOSPITAL_COMMUNITY): Payer: BC Managed Care – PPO

## 2013-04-24 ENCOUNTER — Telehealth: Payer: Self-pay | Admitting: *Deleted

## 2013-04-24 NOTE — Telephone Encounter (Signed)
Called patient to inform of test for 04-25-13- 10:00 am @ [facility], lvm for a return call

## 2013-04-25 ENCOUNTER — Ambulatory Visit (HOSPITAL_COMMUNITY)
Admission: RE | Admit: 2013-04-25 | Discharge: 2013-04-25 | Disposition: A | Discharge status: Home / Self Care | Payer: BC Managed Care – PPO | Source: Ambulatory Visit | Attending: Radiation Oncology | Admitting: Radiation Oncology

## 2013-04-25 DIAGNOSIS — C3490 Malignant neoplasm of unspecified part of unspecified bronchus or lung: Secondary | ICD-10-CM | POA: Insufficient documentation

## 2013-04-25 DIAGNOSIS — M7989 Other specified soft tissue disorders: Secondary | ICD-10-CM | POA: Insufficient documentation

## 2013-04-25 DIAGNOSIS — M79609 Pain in unspecified limb: Secondary | ICD-10-CM | POA: Insufficient documentation

## 2013-04-25 DIAGNOSIS — C7951 Secondary malignant neoplasm of bone: Secondary | ICD-10-CM

## 2013-04-25 NOTE — Progress Notes (Signed)
Right lower extremity venous duplex completed.  Right:  No evidence of DVT, superficial thrombosis, or Baker's cyst.  Left:  Negative for DVT in the common femoral vein.  

## 2013-04-26 ENCOUNTER — Other Ambulatory Visit: Payer: Self-pay | Admitting: Hematology & Oncology

## 2013-04-28 ENCOUNTER — Encounter: Payer: Self-pay | Admitting: Radiation Oncology

## 2013-04-28 NOTE — Addendum Note (Signed)
Encounter addended by: Eppie Gibson, MD on: 04/28/2013  8:07 AM<BR>     Documentation filed: Notes Section

## 2013-04-28 NOTE — Progress Notes (Signed)
I have reviewed the composite plan for [name], acknowledging overlap with her prior Sacral RT.  The composite plan gave dose values BEFORE I re normalized her new plan to 98% isodose line. The Max composite dose is 62.99Gy, Cord/Cauda max dose 61.40Gy, Bowel 60.01Gy. I feel this composite plan is appropriately safe for retreatment.  -----------------------------------  Eppie Gibson, MD

## 2013-04-29 ENCOUNTER — Ambulatory Visit
Admission: RE | Admit: 2013-04-29 | Discharge: 2013-04-29 | Disposition: A | Discharge status: Home / Self Care | Payer: BC Managed Care – PPO | Source: Ambulatory Visit | Attending: Radiation Oncology | Admitting: Radiation Oncology

## 2013-04-29 ENCOUNTER — Encounter: Payer: Self-pay | Admitting: Radiation Oncology

## 2013-04-30 ENCOUNTER — Ambulatory Visit
Admission: RE | Admit: 2013-04-30 | Discharge: 2013-04-30 | Disposition: A | Discharge status: Home / Self Care | Payer: BC Managed Care – PPO | Source: Ambulatory Visit | Attending: Radiation Oncology | Admitting: Radiation Oncology

## 2013-04-30 ENCOUNTER — Other Ambulatory Visit: Payer: Self-pay | Admitting: Radiation Therapy

## 2013-04-30 DIAGNOSIS — C7949 Secondary malignant neoplasm of other parts of nervous system: Principal | ICD-10-CM

## 2013-04-30 DIAGNOSIS — C7931 Secondary malignant neoplasm of brain: Secondary | ICD-10-CM

## 2013-05-01 ENCOUNTER — Ambulatory Visit
Admission: RE | Admit: 2013-05-01 | Discharge: 2013-05-01 | Disposition: A | Discharge status: Home / Self Care | Payer: BC Managed Care – PPO | Source: Ambulatory Visit | Attending: Radiation Oncology | Admitting: Radiation Oncology

## 2013-05-02 ENCOUNTER — Ambulatory Visit
Admission: RE | Admit: 2013-05-02 | Discharge: 2013-05-02 | Disposition: A | Discharge status: Home / Self Care | Payer: BC Managed Care – PPO | Source: Ambulatory Visit | Attending: Radiation Oncology | Admitting: Radiation Oncology

## 2013-05-02 DIAGNOSIS — C7952 Secondary malignant neoplasm of bone marrow: Secondary | ICD-10-CM

## 2013-05-02 DIAGNOSIS — C7931 Secondary malignant neoplasm of brain: Secondary | ICD-10-CM

## 2013-05-02 DIAGNOSIS — C50419 Malignant neoplasm of upper-outer quadrant of unspecified female breast: Secondary | ICD-10-CM

## 2013-05-02 DIAGNOSIS — C3430 Malignant neoplasm of lower lobe, unspecified bronchus or lung: Secondary | ICD-10-CM

## 2013-05-02 DIAGNOSIS — C7951 Secondary malignant neoplasm of bone: Secondary | ICD-10-CM

## 2013-05-02 DIAGNOSIS — C7949 Secondary malignant neoplasm of other parts of nervous system: Principal | ICD-10-CM

## 2013-05-02 LAB — BUN AND CREATININE (CC13)
BUN: 28.1 mg/dL — ABNORMAL HIGH (ref 7.0–26.0)
CREATININE: 1.5 mg/dL — AB (ref 0.6–1.1)

## 2013-05-05 ENCOUNTER — Encounter: Payer: Self-pay | Admitting: Radiation Oncology

## 2013-05-05 ENCOUNTER — Ambulatory Visit
Admission: RE | Admit: 2013-05-05 | Discharge: 2013-05-05 | Disposition: A | Discharge status: Home / Self Care | Payer: BC Managed Care – PPO | Source: Ambulatory Visit | Attending: Radiation Oncology | Admitting: Radiation Oncology

## 2013-05-05 VITALS — BP 156/101 | HR 91 | Temp 98.7°F | Ht 62.0 in | Wt 156.1 lb

## 2013-05-05 DIAGNOSIS — C7931 Secondary malignant neoplasm of brain: Secondary | ICD-10-CM

## 2013-05-05 DIAGNOSIS — C7949 Secondary malignant neoplasm of other parts of nervous system: Principal | ICD-10-CM

## 2013-05-05 NOTE — Progress Notes (Signed)
[name] has received 5 fractions to the sacrum.  She reports a new pain in the right buttock area adjacent to the anal clef and grades this discomfort as a 4/10.  She continues to have pain in her right leg,but reports that this has decreased to a level 5-6/10, but it is aggravated by driving.

## 2013-05-05 NOTE — Progress Notes (Addendum)
   Weekly Management Note:  outpatient Current Dose:  12.5 Gy  Projected Dose: 30 Gy   Narrative:  The patient presents for routine under treatment assessment.  CBCT/MVCT images/Port film x-rays were reviewed.  The chart was checked. Pain is better in sacrum/right leg. Taking 4mg  decadron BID.  Little narcotic use. Has some right buttock pain, new, tolerable. No GI upset.  Physical Findings:  height is 5\' 2"  (1.575 m) and weight is 156 lb 1.6 oz (70.806 kg). Her temperature is 98.7 F (37.1 C). Her blood pressure is 156/101 and her pulse is 91.  NAD, ambulatory.  Impression:  The patient is tolerating radiotherapy.  Plan:  Continue radiotherapy as planned.  Taper to Decadron 4mg  QAM, 2mg  QPM.  ________________________________   Eppie Gibson, M.D.

## 2013-05-06 ENCOUNTER — Ambulatory Visit
Admission: RE | Admit: 2013-05-06 | Discharge: 2013-05-06 | Disposition: A | Discharge status: Home / Self Care | Payer: BC Managed Care – PPO | Source: Ambulatory Visit | Attending: Radiation Oncology | Admitting: Radiation Oncology

## 2013-05-07 ENCOUNTER — Ambulatory Visit
Admission: RE | Admit: 2013-05-07 | Discharge: 2013-05-07 | Disposition: A | Discharge status: Home / Self Care | Payer: BC Managed Care – PPO | Source: Ambulatory Visit | Attending: Radiation Oncology | Admitting: Radiation Oncology

## 2013-05-07 ENCOUNTER — Ambulatory Visit: Payer: BC Managed Care – PPO

## 2013-05-07 ENCOUNTER — Ambulatory Visit: Payer: BC Managed Care – PPO | Admitting: Hematology & Oncology

## 2013-05-07 ENCOUNTER — Other Ambulatory Visit: Payer: BC Managed Care – PPO | Admitting: Lab

## 2013-05-08 ENCOUNTER — Ambulatory Visit: Payer: BC Managed Care – PPO

## 2013-05-08 ENCOUNTER — Encounter: Payer: Self-pay | Admitting: Hematology & Oncology

## 2013-05-08 ENCOUNTER — Ambulatory Visit
Admission: RE | Admit: 2013-05-08 | Discharge: 2013-05-08 | Disposition: A | Discharge status: Home / Self Care | Payer: BC Managed Care – PPO | Source: Ambulatory Visit | Attending: Radiation Oncology | Admitting: Radiation Oncology

## 2013-05-08 ENCOUNTER — Other Ambulatory Visit (HOSPITAL_BASED_OUTPATIENT_CLINIC_OR_DEPARTMENT_OTHER): Payer: BC Managed Care – PPO | Admitting: Lab

## 2013-05-08 ENCOUNTER — Ambulatory Visit (HOSPITAL_BASED_OUTPATIENT_CLINIC_OR_DEPARTMENT_OTHER): Payer: BC Managed Care – PPO

## 2013-05-08 VITALS — BP 147/85 | HR 97 | Temp 97.4°F | Resp 16

## 2013-05-08 DIAGNOSIS — Z5112 Encounter for antineoplastic immunotherapy: Secondary | ICD-10-CM

## 2013-05-08 DIAGNOSIS — C3490 Malignant neoplasm of unspecified part of unspecified bronchus or lung: Secondary | ICD-10-CM

## 2013-05-08 DIAGNOSIS — C7951 Secondary malignant neoplasm of bone: Secondary | ICD-10-CM

## 2013-05-08 DIAGNOSIS — C7952 Secondary malignant neoplasm of bone marrow: Secondary | ICD-10-CM

## 2013-05-08 DIAGNOSIS — C3430 Malignant neoplasm of lower lobe, unspecified bronchus or lung: Secondary | ICD-10-CM

## 2013-05-08 DIAGNOSIS — C50919 Malignant neoplasm of unspecified site of unspecified female breast: Secondary | ICD-10-CM

## 2013-05-08 DIAGNOSIS — C7949 Secondary malignant neoplasm of other parts of nervous system: Secondary | ICD-10-CM

## 2013-05-08 DIAGNOSIS — C7931 Secondary malignant neoplasm of brain: Secondary | ICD-10-CM

## 2013-05-08 DIAGNOSIS — Z5111 Encounter for antineoplastic chemotherapy: Secondary | ICD-10-CM

## 2013-05-08 LAB — CMP (CANCER CENTER ONLY)
ALT(SGPT): 34 U/L (ref 10–47)
AST: 23 U/L (ref 11–38)
Albumin: 2.8 g/dL — ABNORMAL LOW (ref 3.3–5.5)
Alkaline Phosphatase: 112 U/L — ABNORMAL HIGH (ref 26–84)
BUN, Bld: 29 mg/dL — ABNORMAL HIGH (ref 7–22)
CALCIUM: 8.3 mg/dL (ref 8.0–10.3)
CO2: 27 meq/L (ref 18–33)
CREATININE: 1.6 mg/dL — AB (ref 0.6–1.2)
Chloride: 99 mEq/L (ref 98–108)
Glucose, Bld: 158 mg/dL — ABNORMAL HIGH (ref 73–118)
Potassium: 3.8 mEq/L (ref 3.3–4.7)
Sodium: 139 mEq/L (ref 128–145)
Total Bilirubin: 0.5 mg/dl (ref 0.20–1.60)
Total Protein: 6.6 g/dL (ref 6.4–8.1)

## 2013-05-08 LAB — CBC WITH DIFFERENTIAL (CANCER CENTER ONLY)
BASO#: 0 10*3/uL (ref 0.0–0.2)
BASO%: 0.1 % (ref 0.0–2.0)
EOS ABS: 0 10*3/uL (ref 0.0–0.5)
EOS%: 0 % (ref 0.0–7.0)
HEMATOCRIT: 30.8 % — AB (ref 34.8–46.6)
HEMOGLOBIN: 10.1 g/dL — AB (ref 11.6–15.9)
LYMPH#: 0.2 10*3/uL — AB (ref 0.9–3.3)
LYMPH%: 2.5 % — ABNORMAL LOW (ref 14.0–48.0)
MCH: 32.6 pg (ref 26.0–34.0)
MCHC: 32.8 g/dL (ref 32.0–36.0)
MCV: 99 fL (ref 81–101)
MONO#: 0.3 10*3/uL (ref 0.1–0.9)
MONO%: 4.5 % (ref 0.0–13.0)
NEUT#: 6.2 10*3/uL (ref 1.5–6.5)
NEUT%: 92.9 % — ABNORMAL HIGH (ref 39.6–80.0)
Platelets: 308 10*3/uL (ref 145–400)
RBC: 3.1 10*6/uL — ABNORMAL LOW (ref 3.70–5.32)
RDW: 15.6 % (ref 11.1–15.7)
WBC: 6.7 10*3/uL (ref 3.9–10.0)

## 2013-05-08 LAB — UA PROTEIN, DIPSTICK - CHCC SATELLITE: PROTEIN, URINE: 30 mg/dL

## 2013-05-08 MED ORDER — SODIUM CHLORIDE 0.9 % IJ SOLN
10.0000 mL | INTRAMUSCULAR | Status: DC | PRN
  Administered 2013-05-08: 10 mL
  Filled 2013-05-08: qty 10

## 2013-05-08 MED ORDER — LORAZEPAM 0.5 MG PO TABS: 0.5000 mg | ORAL_TABLET | Freq: Four times a day (QID) | ORAL | Status: DC | PRN

## 2013-05-08 MED ORDER — DIPHENHYDRAMINE HCL 50 MG/ML IJ SOLN
50.0000 mg | Freq: Once | INTRAMUSCULAR | Status: AC
  Administered 2013-05-08: 50 mg via INTRAVENOUS

## 2013-05-08 MED ORDER — ACETAMINOPHEN 325 MG PO TABS
650.0000 mg | ORAL_TABLET | Freq: Once | ORAL | Status: AC
  Administered 2013-05-08: 650 mg via ORAL

## 2013-05-08 MED ORDER — HEPARIN SOD (PORK) LOCK FLUSH 100 UNIT/ML IV SOLN
500.0000 [IU] | Freq: Once | INTRAVENOUS | Status: AC | PRN
  Administered 2013-05-08: 500 [IU]
  Filled 2013-05-08: qty 5

## 2013-05-08 MED ORDER — ONDANSETRON HCL 8 MG PO TABS: 8.0000 mg | ORAL_TABLET | Freq: Two times a day (BID) | ORAL | Status: DC

## 2013-05-08 MED ORDER — DEXAMETHASONE SODIUM PHOSPHATE 10 MG/ML IJ SOLN
INTRAMUSCULAR | Status: AC
  Filled 2013-05-08: qty 1

## 2013-05-08 MED ORDER — ONDANSETRON 8 MG/50ML IVPB (CHCC)
8.0000 mg | Freq: Once | INTRAVENOUS | Status: AC
  Administered 2013-05-08: 8 mg via INTRAVENOUS

## 2013-05-08 MED ORDER — DEXAMETHASONE SODIUM PHOSPHATE 10 MG/ML IJ SOLN
10.0000 mg | Freq: Once | INTRAMUSCULAR | Status: AC
  Administered 2013-05-08: 10 mg via INTRAVENOUS

## 2013-05-08 MED ORDER — DIPHENHYDRAMINE HCL 50 MG/ML IJ SOLN
INTRAMUSCULAR | Status: AC
  Filled 2013-05-08: qty 1

## 2013-05-08 MED ORDER — DOCETAXEL CHEMO INJECTION 160 MG/16ML
67.5000 mg/m2 | Freq: Once | INTRAVENOUS | Status: AC
  Administered 2013-05-08: 120 mg via INTRAVENOUS
  Filled 2013-05-08: qty 12

## 2013-05-08 MED ORDER — SODIUM CHLORIDE 0.9 % IV SOLN
9.9000 mg/kg | Freq: Once | INTRAVENOUS | Status: AC
  Administered 2013-05-08: 700 mg via INTRAVENOUS
  Filled 2013-05-08: qty 70

## 2013-05-08 MED ORDER — SODIUM CHLORIDE 0.9 % IV SOLN
3.0000 mg | Freq: Once | INTRAVENOUS | Status: AC
  Administered 2013-05-08: 3 mg via INTRAVENOUS
  Filled 2013-05-08: qty 3.75

## 2013-05-08 MED ORDER — DIPHENHYDRAMINE HCL 25 MG PO CAPS
ORAL_CAPSULE | ORAL | Status: AC
  Filled 2013-05-08: qty 2

## 2013-05-08 MED ORDER — ACETAMINOPHEN 325 MG PO TABS
ORAL_TABLET | ORAL | Status: AC
  Filled 2013-05-08: qty 2

## 2013-05-08 MED ORDER — SODIUM CHLORIDE 0.9 % IV SOLN
Freq: Once | INTRAVENOUS | Status: AC
  Administered 2013-05-08: 09:00:00 via INTRAVENOUS

## 2013-05-08 NOTE — Patient Instructions (Addendum)
Docetaxel injection What is this medicine? DOCETAXEL (doe se TAX el) is a chemotherapy drug. It targets fast dividing cells, like cancer cells, and causes these cells to die. This medicine is used to treat many types of cancers like breast cancer, certain stomach cancers, head and neck cancer, lung cancer, and prostate cancer. This medicine may be used for other purposes; ask your health care provider or pharmacist if you have questions. COMMON BRAND NAME(S): Docefrez , Taxotere What should I tell my health care provider before I take this medicine? They need to know if you have any of these conditions: -infection (especially a virus infection such as chickenpox, cold sores, or herpes) -liver disease -low blood counts, like low white cell, platelet, or red cell counts -an unusual or allergic reaction to docetaxel, polysorbate 80, other chemotherapy agents, other medicines, foods, dyes, or preservatives -pregnant or trying to get pregnant -breast-feeding How should I use this medicine? This drug is given as an infusion into a vein. It is administered in a hospital or clinic by a specially trained health care professional. Talk to your pediatrician regarding the use of this medicine in children. Special care may be needed. Overdosage: If you think you have taken too much of this medicine contact a poison control center or emergency room at once. NOTE: This medicine is only for you. Do not share this medicine with others. What if I miss a dose? It is important not to miss your dose. Call your doctor or health care professional if you are unable to keep an appointment. What may interact with this medicine? -cyclosporine -erythromycin -ketoconazole -medicines to increase blood counts like filgrastim, pegfilgrastim, sargramostim -vaccines Talk to your doctor or health care professional before taking any of these medicines: -acetaminophen -aspirin -ibuprofen -ketoprofen -naproxen This  list may not describe all possible interactions. Give your health care provider a list of all the medicines, herbs, non-prescription drugs, or dietary supplements you use. Also tell them if you smoke, drink alcohol, or use illegal drugs. Some items may interact with your medicine. What should I watch for while using this medicine? Your condition will be monitored carefully while you are receiving this medicine. You will need important blood work done while you are taking this medicine. This drug may make you feel generally unwell. This is not uncommon, as chemotherapy can affect healthy cells as well as cancer cells. Report any side effects. Continue your course of treatment even though you feel ill unless your doctor tells you to stop. In some cases, you may be given additional medicines to help with side effects. Follow all directions for their use. Call your doctor or health care professional for advice if you get a fever, chills or sore throat, or other symptoms of a cold or flu. Do not treat yourself. This drug decreases your body's ability to fight infections. Try to avoid being around people who are sick. This medicine may increase your risk to bruise or bleed. Call your doctor or health care professional if you notice any unusual bleeding. Be careful brushing and flossing your teeth or using a toothpick because you may get an infection or bleed more easily. If you have any dental work done, tell your dentist you are receiving this medicine. Avoid taking products that contain aspirin, acetaminophen, ibuprofen, naproxen, or ketoprofen unless instructed by your doctor. These medicines may hide a fever. Do not become pregnant while taking this medicine. Women should inform their doctor if they wish to become pregnant  or think they might be pregnant. There is a potential for serious side effects to an unborn child. Talk to your health care professional or pharmacist for more information. Do not  breast-feed an infant while taking this medicine. What side effects may I notice from receiving this medicine? Side effects that you should report to your doctor or health care professional as soon as possible: -allergic reactions like skin rash, itching or hives, swelling of the face, lips, or tongue -low blood counts - This drug may decrease the number of white blood cells, red blood cells and platelets. You may be at increased risk for infections and bleeding. -signs of infection - fever or chills, cough, sore throat, pain or difficulty passing urine -signs of decreased platelets or bleeding - bruising, pinpoint red spots on the skin, black, tarry stools, nosebleeds -signs of decreased red blood cells - unusually weak or tired, fainting spells, lightheadedness -breathing problems -fast or irregular heartbeat -low blood pressure -mouth sores -nausea and vomiting -pain, swelling, redness or irritation at the injection site -pain, tingling, numbness in the hands or feet -swelling of the ankle, feet, hands -weight gain Side effects that usually do not require medical attention (report to your prescriber or health care professional if they continue or are bothersome): -bone pain -complete hair loss including hair on your head, underarms, pubic hair, eyebrows, and eyelashes -diarrhea -excessive tearing -changes in the color of fingernails -loosening of the fingernails -nausea -muscle pain -red flush to skin -sweating -weak or tired This list may not describe all possible side effects. Call your doctor for medical advice about side effects. You may report side effects to FDA at 1-800-FDA-1088. Where should I keep my medicine? This drug is given in a hospital or clinic and will not be stored at home. NOTE: This sheet is a summary. It may not cover all possible information. If you have questions about this medicine, talk to your doctor, pharmacist, or health care provider.  2014,  Elsevier/Gold Standard. (2007-12-16 11:52:10) Today you also received Ramucirumab.  Please take your nausea and steroid meds as directed: Decadron 4 mg - Take two tabs (8 mg) twice daily  the day before chemo, omit the day of chemo, then resume two tabs (8 mg) twice daily for 4 days afterwards.  Zofran 8 mg- Take one twice daily for 3 days starting the day after chemo. You also have Compazine to take as directed on the bottle and you have the Rx for Ativan to take as directed.   [facility] Discharge Instructions for Patients Receiving Chemotherapy  Today you received chemotherapy agents:  Taxotere and Ramucirumab  To help prevent nausea and vomiting after your treatment, we encourage you to take your nausea medication as prescribed. If you develop nausea and vomiting that is not controlled by your nausea medication, call the clinic. If it is after clinic hours your family physician or the after hours number for the clinic or go to the Emergency Department.   BELOW ARE SYMPTOMS THAT SHOULD BE REPORTED IMMEDIATELY:  *FEVER GREATER THAN 100.5 F  *CHILLS WITH OR WITHOUT FEVER  NAUSEA AND VOMITING THAT IS NOT CONTROLLED WITH YOUR NAUSEA MEDICATION  *UNUSUAL SHORTNESS OF BREATH  *UNUSUAL BRUISING OR BLEEDING  TENDERNESS IN MOUTH AND THROAT WITH OR WITHOUT PRESENCE OF ULCERS  *URINARY PROBLEMS  *BOWEL PROBLEMS  UNUSUAL RASH Items with * indicate a potential emergency and should be followed up as soon as possible.   Please let the nurse know  about any problems that you may have experienced. Feel free to call the clinic you have any questions or concerns. The clinic phone number is [phone].   I have been informed and understand all the instructions given to me. I know to contact the clinic, my physician, or go to the Emergency Department if any problems should occur. I do not have any questions at this time, but understand that I may call the clinic during office  hours   should I have any questions or need assistance in obtaining follow up care.    __________________________________________  _____________  __________ Signature of Patient or Authorized Representative            Date                   Time    __________________________________________ Nurse's Signature

## 2013-05-09 ENCOUNTER — Encounter: Payer: Self-pay | Admitting: *Deleted

## 2013-05-09 ENCOUNTER — Ambulatory Visit
Admission: RE | Admit: 2013-05-09 | Discharge: 2013-05-09 | Disposition: A | Discharge status: Home / Self Care | Payer: BC Managed Care – PPO | Source: Ambulatory Visit | Attending: Radiation Oncology | Admitting: Radiation Oncology

## 2013-05-09 ENCOUNTER — Ambulatory Visit (HOSPITAL_BASED_OUTPATIENT_CLINIC_OR_DEPARTMENT_OTHER): Payer: BC Managed Care – PPO

## 2013-05-09 VITALS — BP 165/94 | HR 80 | Temp 97.0°F | Resp 16

## 2013-05-09 DIAGNOSIS — C3430 Malignant neoplasm of lower lobe, unspecified bronchus or lung: Secondary | ICD-10-CM

## 2013-05-09 DIAGNOSIS — C7951 Secondary malignant neoplasm of bone: Secondary | ICD-10-CM

## 2013-05-09 DIAGNOSIS — Z5189 Encounter for other specified aftercare: Secondary | ICD-10-CM

## 2013-05-09 DIAGNOSIS — C3490 Malignant neoplasm of unspecified part of unspecified bronchus or lung: Secondary | ICD-10-CM

## 2013-05-09 DIAGNOSIS — C7952 Secondary malignant neoplasm of bone marrow: Secondary | ICD-10-CM

## 2013-05-09 MED ORDER — PEGFILGRASTIM INJECTION 6 MG/0.6ML
6.0000 mg | Freq: Once | SUBCUTANEOUS | Status: AC
  Administered 2013-05-09: 6 mg via SUBCUTANEOUS

## 2013-05-09 MED ORDER — PEGFILGRASTIM INJECTION 6 MG/0.6ML
SUBCUTANEOUS | Status: AC
  Filled 2013-05-09: qty 0.6

## 2013-05-09 NOTE — Progress Notes (Signed)
[facility] Psychosocial Distress Screening Clinical Social Work  Clinical Social Work was referred by distress screening protocol.  The patient scored a 7 on the Psychosocial Distress Thermometer which indicates moderate distress. Clinical Social Worker phoned to assess for distress and other psychosocial needs. CSW left message with pt at home to return call to CSW. CSW will also try to see at pt's future appointments.   ON[facility] DISTRESS SCREENING 04/23/2013  Screening Type Change in Status  Mark the number that describes how much distress you have been experiencing in the past week 7  Practical problem type Housing;Food  Emotional problem type Depression;Boredom  Spiritual/Religous concerns type Facing my mortality  Physical Problem type Pain;Getting around  Physician notified of physical symptoms Yes  Referral to clinical social work Yes   Clinical Social Worker follow up needed: yes  If yes, follow up plan: CSW will continue to to try to phone and awaits return call.   Loren Racer, LCSW Clinical Social Worker [facility] Wednesday, Thursday and Friday Phone: [phone] Fax: [phone]

## 2013-05-09 NOTE — Patient Instructions (Signed)

## 2013-05-12 ENCOUNTER — Ambulatory Visit: Payer: BC Managed Care – PPO | Admitting: Radiation Oncology

## 2013-05-12 ENCOUNTER — Ambulatory Visit
Admission: RE | Admit: 2013-05-12 | Discharge: 2013-05-12 | Disposition: A | Discharge status: Home / Self Care | Payer: BC Managed Care – PPO | Source: Ambulatory Visit | Attending: Radiation Oncology | Admitting: Radiation Oncology

## 2013-05-12 VITALS — BP 153/99 | HR 101 | Temp 98.8°F | Ht 62.0 in | Wt 154.6 lb

## 2013-05-12 DIAGNOSIS — C7949 Secondary malignant neoplasm of other parts of nervous system: Principal | ICD-10-CM

## 2013-05-12 DIAGNOSIS — C7931 Secondary malignant neoplasm of brain: Secondary | ICD-10-CM

## 2013-05-12 DIAGNOSIS — C7951 Secondary malignant neoplasm of bone: Secondary | ICD-10-CM

## 2013-05-12 NOTE — Progress Notes (Signed)
   Weekly Management Note:  outpatient Current Dose:  22.5 Gy  Projected Dose: 30 Gy   Narrative:  The patient presents for routine under treatment assessment.  CBCT/MVCT images/Port film x-rays were reviewed.  The chart was checked. Seen before RT today. Tired, and cold. Pain in sacrum improved. Still has calf pain and foot numbness on Right.  Physical Findings:  height is 5\' 2"  (1.575 m) and weight is 154 lb 9.6 oz (70.126 kg). Her temperature is 98.8 F (37.1 C). Her blood pressure is 153/99 and her pulse is 101. Her oxygen saturation is 99%.  NAD, no skin irritation over sacrum  CBC    Component Value Date/Time   WBC 6.7 05/08/2013 0839   WBC 9.0 07/02/2012 0805   RBC 3.10* 05/08/2013 0839   RBC 2.47* 03/19/2013 0750   RBC 4.24 07/02/2012 0805   HGB 10.1* 05/08/2013 0839   HGB 11.5* 07/02/2012 0805   HCT 30.8* 05/08/2013 0839   HCT 34.9* 07/02/2012 0805   PLT 308 05/08/2013 0839   PLT 551* 07/02/2012 0805   MCV 99 05/08/2013 0839   MCV 82.3 07/02/2012 0805   MCH 32.6 05/08/2013 0839   MCH 27.1 07/02/2012 0805   MCHC 32.8 05/08/2013 0839   MCHC 33.0 07/02/2012 0805   RDW 15.6 05/08/2013 0839   RDW 14.3 07/02/2012 0805   LYMPHSABS 0.2* 05/08/2013 0839   LYMPHSABS 0.7 12/13/2011 0755   MONOABS 0.6 12/13/2011 0755   EOSABS 0.0 05/08/2013 0839   EOSABS 0.4 12/13/2011 0755   BASOSABS 0.0 05/08/2013 0839   BASOSABS 0.0 12/13/2011 0755     CMP     Component Value Date/Time   NA 139 05/08/2013 0840   NA 143 08/14/2012 0907   K 3.8 05/08/2013 0840   K 3.9 08/14/2012 0907   CL 99 05/08/2013 0840   CL 106 08/14/2012 0907   CO2 27 05/08/2013 0840   CO2 25 08/14/2012 0907   GLUCOSE 158* 05/08/2013 0840   GLUCOSE 111* 08/14/2012 0907   BUN 29* 05/08/2013 0840   BUN 28.1* 05/02/2013 1227   BUN 10 08/14/2012 0907   CREATININE 1.6* 05/08/2013 0840   CREATININE 1.5* 05/02/2013 1227   CREATININE 0.71 08/14/2012 0907   CALCIUM 8.3 05/08/2013 0840   CALCIUM 9.0 08/14/2012 0907   PROT 6.6 05/08/2013 0840   PROT  6.3 08/14/2012 0907   ALBUMIN 3.8 08/14/2012 0907   AST 23 05/08/2013 0840   AST 31 08/14/2012 0907   ALT 34 05/08/2013 0840   ALT 37* 08/14/2012 0907   ALKPHOS 112* 05/08/2013 0840   ALKPHOS 270* 08/14/2012 0907   BILITOT 0.50 05/08/2013 0840   BILITOT 0.2* 08/14/2012 0907   GFRNONAA >90 12/13/2011 0755   GFRAA >90 12/13/2011 0755   No results found for this basename: TSH   Estimated Creatinine Clearance: 34.7 ml/min (by C-G formula based on Cr of 1.6).    Impression:  The patient is tolerating radiotherapy.   Plan:  Continue radiotherapy as planned. Discussed with pt that kidney function appears to be in a window of opportunity for MRI of brain with reduced contrast to survey her metastatic disease. She is amenable to this. I will notifiy [facility] [place]. Will check TSH this week for above sx.  -----------------------------------  Eppie Gibson, MD

## 2013-05-12 NOTE — Progress Notes (Addendum)
[name] has had 9 fractions to her sacrum.  She denies pain, nausea and diarrhea today.  She reports that her appetitie is not very good.  She has lost 2 lbs since last week.  She reports fatigue.  She is taking decadron for chemotherapy and she stops taking it tomorrow.

## 2013-05-13 ENCOUNTER — Ambulatory Visit
Admission: RE | Admit: 2013-05-13 | Discharge: 2013-05-13 | Disposition: A | Discharge status: Home / Self Care | Payer: BC Managed Care – PPO | Source: Ambulatory Visit | Attending: Radiation Oncology | Admitting: Radiation Oncology

## 2013-05-14 ENCOUNTER — Encounter: Payer: Self-pay | Admitting: Radiation Oncology

## 2013-05-14 ENCOUNTER — Ambulatory Visit: Admission: RE | Admit: 2013-05-14 | Payer: BC Managed Care – PPO | Source: Ambulatory Visit

## 2013-05-14 ENCOUNTER — Ambulatory Visit
Admission: RE | Admit: 2013-05-14 | Discharge: 2013-05-14 | Disposition: A | Discharge status: Home / Self Care | Payer: BC Managed Care – PPO | Source: Ambulatory Visit | Attending: Radiation Oncology | Admitting: Radiation Oncology

## 2013-05-16 ENCOUNTER — Ambulatory Visit
Admission: RE | Admit: 2013-05-16 | Discharge: 2013-05-16 | Disposition: A | Discharge status: Home / Self Care | Payer: BC Managed Care – PPO | Source: Ambulatory Visit | Attending: Radiation Oncology | Admitting: Radiation Oncology

## 2013-05-16 ENCOUNTER — Telehealth: Payer: Self-pay | Admitting: *Deleted

## 2013-05-16 DIAGNOSIS — C7931 Secondary malignant neoplasm of brain: Secondary | ICD-10-CM

## 2013-05-16 DIAGNOSIS — C7949 Secondary malignant neoplasm of other parts of nervous system: Principal | ICD-10-CM

## 2013-05-16 MED ORDER — GADOBENATE DIMEGLUMINE 529 MG/ML IV SOLN
7.0000 mL | Freq: Once | INTRAVENOUS | Status: AC | PRN
  Administered 2013-05-16: 7 mL via INTRAVENOUS

## 2013-05-16 NOTE — Progress Notes (Signed)
  Radiation Oncology         (336) [phone] ________________________________  Name: [name] MRN: [id number]  Date: 05/14/2013  DOB: Jan 17, 1955  End of Treatment Note  Diagnosis:   Adenocarcinoma of the lung, with brain/bone metastases   Indication for treatment:  palliative       Radiation treatment dates:   04/29/2013-05/14/2013  Site/dose:   Right S2 mass / 30 Gy in 12 fractions  Beams/energy:   3D conformal / 10 and 15 MV photons  Narrative: The patient tolerated radiation treatment relatively well with some fatigue. Sacral/ right leg pain improved.  Plan: The patient has completed radiation treatment. The patient will return to radiation oncology clinic for routine followup in one week. I advised them to call or return sooner if they have any questions or concerns related to their recovery or treatment.  -----------------------------------  Eppie Gibson, MD

## 2013-05-16 NOTE — Telephone Encounter (Signed)
CALLED PATIENT TO ASK ABOUT COMING IN FOR LABS ON 05-19-13, PATIENT STATED THAT SHE WOULD BE HERE ON 05-19-13

## 2013-05-19 ENCOUNTER — Ambulatory Visit (HOSPITAL_BASED_OUTPATIENT_CLINIC_OR_DEPARTMENT_OTHER)
Admission: RE | Admit: 2013-05-19 | Discharge: 2013-05-19 | Disposition: A | Discharge status: Home / Self Care | Payer: BC Managed Care – PPO | Source: Ambulatory Visit | Attending: Radiation Oncology | Admitting: Radiation Oncology

## 2013-05-19 ENCOUNTER — Ambulatory Visit
Admission: RE | Admit: 2013-05-19 | Discharge: 2013-05-19 | Disposition: A | Discharge status: Home / Self Care | Payer: BC Managed Care – PPO | Source: Ambulatory Visit | Attending: Radiation Oncology | Admitting: Radiation Oncology

## 2013-05-19 ENCOUNTER — Encounter: Payer: Self-pay | Admitting: Radiation Oncology

## 2013-05-19 VITALS — BP 148/92 | HR 105 | Temp 99.0°F | Wt 151.3 lb

## 2013-05-19 DIAGNOSIS — C7951 Secondary malignant neoplasm of bone: Secondary | ICD-10-CM

## 2013-05-19 DIAGNOSIS — C7949 Secondary malignant neoplasm of other parts of nervous system: Secondary | ICD-10-CM

## 2013-05-19 DIAGNOSIS — C3430 Malignant neoplasm of lower lobe, unspecified bronchus or lung: Secondary | ICD-10-CM

## 2013-05-19 DIAGNOSIS — C7931 Secondary malignant neoplasm of brain: Secondary | ICD-10-CM

## 2013-05-19 DIAGNOSIS — C7952 Secondary malignant neoplasm of bone marrow: Secondary | ICD-10-CM

## 2013-05-19 DIAGNOSIS — C3490 Malignant neoplasm of unspecified part of unspecified bronchus or lung: Secondary | ICD-10-CM

## 2013-05-19 NOTE — Progress Notes (Signed)
Ms. [name] here today for review of CT scan of her head.  She appears very fatigued today.  She denies any pain.

## 2013-05-19 NOTE — Progress Notes (Signed)
Radiation Oncology         (336) [phone] ________________________________  Name: [name] MRN: [id number]  Date: 05/19/2013  DOB: 03-15-1955  Follow-Up Visit Note  outpatient  CC: Orpah Melter, MD  Orpah Melter, MD  Diagnosis and Prior Radiotherapy:  Adenocarcinoma of the lung, with brain/bone metastases  She also has early stage breast cancer managed with Aromasin   Radiation treatment dates:   On 05-14-13 she completed 30 Gy in 12 fractions to the Right S2 mass.  12/30/2012  Right frontal 3.8 mm target was treated using 3 circular cone Arcs to a prescription dose of 20 Gy.  Right temporal 2.7 mm target was treated using 3 circular cone Arcs to a prescription dose of 20 Gy.  Of note, Dr Lisbeth Renshaw discussed possible treatment to 4 lesions in the brain conference. 2 of the 4 lesions had been treated previously. These are small lesions with a slight increase and these therefore will be followed on future scans.   She completed stereotactic radiosurgery to the Left temporal 26mm target, Left cerebellar 23mm target , Left posterior superior frontal 56mm target, Right anterior temporal 47mm target , Right superior vermian 46mm target , Left occipital 36mm target, all to 20 Gy in 07-26-12   She completed stereotactic radiosurgery to the right frontal, right thalamic, left frontal and left occipital target, 20 Gray in 1 fraction to each target, on 07/19/11.   07/05/2011-07/21/2011 - LS spine and thoracic spine (T7-T9) 30 Gy in 10 sessions    Narrative:  The patient returns today for routine follow-up. Her main complaint is profound fatigue which started about one and a half weeks ago.  She is status post 1 cycle of chemotherapy recently, and 12 treatments of radiotherapy to her sacrum. The pain in her sacrum in her right leg have improved significantly but she still is numbness of her right foot and a sensation of cramping in her calf. She has a little bit lightheadedness upon standing. She  denies any major problems coordination. No major headaches. No other new neurologic complaints. No new shortness of breath.     She recently underwent MRI of her Brain due to improved renal function finally allowing some IV contrast to be administered for surveillance.    Previous MRI was about 5 months ago.  Results and imaging reviewed this AM at CNS board.  ALLERGIES:  has No Known Allergies.  Meds: Current Outpatient Prescriptions  Medication Sig Dispense Refill  . ALPRAZolam (XANAX) 0.25 MG tablet Take 0.25-0.5 mg by mouth as needed. For anxiety.      Marland Kitchen amLODipine (NORVASC) 2.5 MG tablet Take 2.5 mg by mouth daily.      Marland Kitchen exemestane (AROMASIN) 25 MG tablet TAKE 1 TABLET BY MOUTH IN THE MORNING AFTER BREAKFAST  30 tablet  6  . HYDROcodone-acetaminophen (NORCO) 10-325 MG per tablet Take 1 tablet by mouth every 6 (six) hours as needed.  120 tablet  0  . ibuprofen (ADVIL,MOTRIN) 200 MG tablet Take 600 mg by mouth every 6 (six) hours as needed for pain.       Marland Kitchen lidocaine-prilocaine (EMLA) cream Apply topically as needed.       . loperamide (IMODIUM) 2 MG capsule Take 2 mg by mouth 4 (four) times daily as needed. For diarrhea      . LORazepam (ATIVAN) 0.5 MG tablet       . LORazepam (ATIVAN) 0.5 MG tablet Take 1 tablet (0.5 mg total) by mouth every 6 (six) hours as  needed (Nausea or vomiting).  30 tablet  0  . methylphenidate (RITALIN) 5 MG tablet Take 1 pill TWICE a day.  60 tablet  0  . ondansetron (ZOFRAN) 8 MG tablet Take 1 tablet (8 mg total) by mouth 2 (two) times daily. Start the day after chemo, then as needed for nausea or vomiting.  30 tablet  1  . Probiotic Product (ALIGN) 4 MG CAPS Take by mouth every morning.      . prochlorperazine (COMPAZINE) 10 MG tablet TAKE 1 TABLET (10 MG TOTAL) BY MOUTH EVERY 6 (SIX) HOURS AS NEEDED FOR NAUSEA OR VOMITING.  30 tablet  1  . zolendronic acid (ZOMETA) 4 MG/5ML injection Inject 4 mg into the vein every 30 (thirty) days.      Marland Kitchen zolpidem (AMBIEN)  5 MG tablet Take 5 mg by mouth at bedtime as needed for sleep.      Marland Kitchen dexamethasone (DECADRON) 4 MG tablet Take 2 pills twice a day for 5 days.  Start day before chemo and take for 5 days only.  60 tablet  3  . dexamethasone (DECADRON) 4 MG tablet Take 1 tablet BID, except on days when taking this as prescribed for chemotherapy  60 tablet  0  . naphazoline (NAPHCON) 0.1 % ophthalmic solution Place 1 drop into both eyes 4 (four) times daily as needed.  15 mL  2   No current facility-administered medications for this encounter.   Facility-Administered Medications Ordered in Other Encounters  Medication Dose Route Frequency Provider Last Rate Last Dose  . sodium chloride 0.9 % injection 10 mL  10 mL Intracatheter PRN Volanda Napoleon, MD   10 mL at 09/04/12 1511    Physical Findings: The patient is in no acute distress. Patient is alert and oriented.  weight is 151 lb 4.8 oz (68.629 kg). Her temperature is 99 F (37.2 C). Her blood pressure is 148/92 and her pulse is 105. Her oxygen saturation is 100%. .   Oropharynx is clear. Extraocular movements are intact. Strength is intact and symmetric throughout. Coordination is intact per finger to nose testing. Speech is fluent. Affect is appropriate. She does appear tired. Judgment and insight are intact.    Lab Findings: Lab Results  Component Value Date   WBC 6.7 05/08/2013   HGB 10.1* 05/08/2013   HCT 30.8* 05/08/2013   MCV 99 05/08/2013   PLT 308 05/08/2013    Radiographic Findings: Mr Jeri Cos PN Contrast  05/16/2013   CLINICAL DATA:  58 year old female with metastatic lung cancer status post stereotactic radiosurgery. Recent restaging demonstrating some progressive lesions. Subsequent encounter.  EXAM: MRI HEAD WITHOUT AND WITH CONTRAST  TECHNIQUE: Multiplanar, multiecho pulse sequences of the brain and surrounding structures were obtained without and with intravenous contrast.  CONTRAST:  110mL MULTIHANCE GADOBENATE DIMEGLUMINE 529 MG/ML IV SOLN   COMPARISON:  12/20/2012 and earlier.  FINDINGS: A total of 21 brain metastases are identified today.  Of these, 11 are new. These new metastases measure up to 6 mm in size, but most are 3 mm. These lesions are annotated with double arrows on series 10. Two lesions involve the brainstem (left pons series 10, image 51 and dorsal midbrain on the right image 70).  All of the previously identified lesions are stable or slightly decreased.  No restricted diffusion or evidence of acute infarction. No ventriculomegaly. No midline shift or mass effect. No confluent cerebral edema. Negative pituitary, cervicomedullary junction visualized cervical spine. Stable bone marrow signal. No acute  intracranial hemorrhage identified. Major intracranial vascular flow voids are stable. Stable orbits and scalp. Stable mastoids and paranasal sinuses except for increased left maxillary sinus mucous retention cyst.  IMPRESSION: 1. Eleven new brain metastases, all 6 mm or smaller. Two of these involve the brainstem. All lesions annotated on series 10. 2. Ten other brain metastases are stable or decreased since December.   Electronically Signed   By: Lars Pinks M.D.   On: 05/16/2013 16:23   Mr Pelvis Wo Contrast  04/22/2013   CLINICAL DATA:  Metastatic lung cancer.  EXAM: MRI PELVIS WITHOUT CONTRAST  TECHNIQUE: Multiplanar multisequence MR imaging of the pelvis was performed. No intravenous contrast was administered.  COMPARISON:  CT scan 04/04/2013  FINDINGS: There is diffuse and relatively stable appearing sclerotic metastatic disease involving the lower spine and pelvis. There also lytic lesions involving the right aspect of the sacrum, the right acetabulum, the left ischium and the left femoral neck. No pathologic fracture is identified.  The right sacral lesion does have mass effect on the right S2 nerve root as it exits the neural foramen.  No significant intrapelvic abnormalities other than a small amount of free pelvic fluid. No  intrapelvic mass or adenopathy. No inguinal adenopathy.  IMPRESSION: 1. Diffuse sclerotic and lytic metastatic bone disease involving the pelvis and hips. No definite pathologic fracture. 2. The right sacral lesion does have mass effect on the right S2 nerve root.   Electronically Signed   By: Kalman Jewels M.D.   On: 04/22/2013 15:15    Impression/Plan:   I had a lengthy discussion with Ms. Riese after reviewing her MRI results as discussed at tumor board this AM.  She has 11 new brain metastases, all 77mm or less in size. She is asymptomatic. We spoke about the risks benefits and side effects of whole brain radiotherapy to address here significant number of brain metastases.  During part of our discussion, we spoke about the fatigue, HA, nausea, and hair loss that can occur acutely as well as late cognitive side effects that can result from whole brain radiotherapy - this is usually notable in terms of short term memory and speed of thinking, but rarely a frank dementia.  Consent form was signed today.   I anticipate 35 Gy in 14 fractions. We discussed the use of radiosurgery if needed for salvage, but she understands that this is a fairly diffuse process and using radiosurgery at this time would not be in her best interest because she would be at very high risk for failure at other sites of the brain.  Since she is profoundly fatigued, so I do not think we need to give the radiotherapy right away. I also think is not a bad idea to allow her to get at least one or 2 more cycles of chemotherapy first to control her lung metastases. I will talk with her medical oncologist, Dr. Marin Olp, to secure the timing of whole brain radiotherapy in between cycles of chemotherapy. She may need a longer break than usual from chemotherapy so  that she does not have significant chemotherapy in her system to increase the toxicity of brain radiotherapy. Also for her fatigue, I ordered a TSH today as well as a CBC to verify  if she has any hypothyroidism or anemia responsible for her symptoms.    _____________________________________   Eppie Gibson, MD

## 2013-05-20 ENCOUNTER — Ambulatory Visit: Payer: BC Managed Care – PPO | Admitting: Radiation Oncology

## 2013-05-20 LAB — TSH CHCC: TSH: 1.454 m(IU)/L (ref 0.308–3.960)

## 2013-05-26 ENCOUNTER — Ambulatory Visit (HOSPITAL_BASED_OUTPATIENT_CLINIC_OR_DEPARTMENT_OTHER): Payer: BC Managed Care – PPO

## 2013-05-26 ENCOUNTER — Ambulatory Visit (HOSPITAL_BASED_OUTPATIENT_CLINIC_OR_DEPARTMENT_OTHER): Payer: BC Managed Care – PPO | Admitting: Hematology & Oncology

## 2013-05-26 ENCOUNTER — Other Ambulatory Visit (HOSPITAL_BASED_OUTPATIENT_CLINIC_OR_DEPARTMENT_OTHER): Payer: BC Managed Care – PPO | Admitting: Lab

## 2013-05-26 VITALS — BP 148/93 | HR 105 | Temp 96.9°F | Resp 16

## 2013-05-26 DIAGNOSIS — C7931 Secondary malignant neoplasm of brain: Secondary | ICD-10-CM

## 2013-05-26 DIAGNOSIS — C50919 Malignant neoplasm of unspecified site of unspecified female breast: Secondary | ICD-10-CM

## 2013-05-26 DIAGNOSIS — C3490 Malignant neoplasm of unspecified part of unspecified bronchus or lung: Secondary | ICD-10-CM

## 2013-05-26 DIAGNOSIS — C3430 Malignant neoplasm of lower lobe, unspecified bronchus or lung: Secondary | ICD-10-CM

## 2013-05-26 DIAGNOSIS — Z5112 Encounter for antineoplastic immunotherapy: Secondary | ICD-10-CM

## 2013-05-26 DIAGNOSIS — Z5111 Encounter for antineoplastic chemotherapy: Secondary | ICD-10-CM

## 2013-05-26 DIAGNOSIS — C7949 Secondary malignant neoplasm of other parts of nervous system: Secondary | ICD-10-CM

## 2013-05-26 DIAGNOSIS — M5416 Radiculopathy, lumbar region: Secondary | ICD-10-CM

## 2013-05-26 LAB — CMP (CANCER CENTER ONLY)
ALT(SGPT): 18 U/L (ref 10–47)
AST: 28 U/L (ref 11–38)
Albumin: 2.9 g/dL — ABNORMAL LOW (ref 3.3–5.5)
Alkaline Phosphatase: 153 U/L — ABNORMAL HIGH (ref 26–84)
BILIRUBIN TOTAL: 0.4 mg/dL (ref 0.20–1.60)
BUN, Bld: 15 mg/dL (ref 7–22)
CHLORIDE: 100 meq/L (ref 98–108)
CO2: 27 mEq/L (ref 18–33)
CREATININE: 1.7 mg/dL — AB (ref 0.6–1.2)
Calcium: 9.6 mg/dL (ref 8.0–10.3)
GLUCOSE: 168 mg/dL — AB (ref 73–118)
Potassium: 4 mEq/L (ref 3.3–4.7)
Sodium: 143 mEq/L (ref 128–145)
TOTAL PROTEIN: 7.4 g/dL (ref 6.4–8.1)

## 2013-05-26 LAB — CBC WITH DIFFERENTIAL (CANCER CENTER ONLY)
BASO#: 0.1 10*3/uL (ref 0.0–0.2)
BASO%: 0.6 % (ref 0.0–2.0)
EOS%: 0 % (ref 0.0–7.0)
Eosinophils Absolute: 0 10*3/uL (ref 0.0–0.5)
HEMATOCRIT: 30.1 % — AB (ref 34.8–46.6)
HGB: 9.9 g/dL — ABNORMAL LOW (ref 11.6–15.9)
LYMPH#: 0.6 10*3/uL — ABNORMAL LOW (ref 0.9–3.3)
LYMPH%: 3.1 % — ABNORMAL LOW (ref 14.0–48.0)
MCH: 32.9 pg (ref 26.0–34.0)
MCHC: 32.9 g/dL (ref 32.0–36.0)
MCV: 100 fL (ref 81–101)
MONO#: 0.8 10*3/uL (ref 0.1–0.9)
MONO%: 4.2 % (ref 0.0–13.0)
NEUT#: 16.9 10*3/uL — ABNORMAL HIGH (ref 1.5–6.5)
NEUT%: 92.1 % — ABNORMAL HIGH (ref 39.6–80.0)
Platelets: 223 10*3/uL (ref 145–400)
RBC: 3.01 10*6/uL — ABNORMAL LOW (ref 3.70–5.32)
RDW: 15.6 % (ref 11.1–15.7)
WBC: 18.3 10*3/uL — ABNORMAL HIGH (ref 3.9–10.0)

## 2013-05-26 LAB — TECHNOLOGIST REVIEW CHCC SATELLITE

## 2013-05-26 LAB — LACTATE DEHYDROGENASE: LDH: 371 U/L — ABNORMAL HIGH (ref 94–250)

## 2013-05-26 MED ORDER — CYANOCOBALAMIN 1000 MCG/ML IJ SOLN: 1000.0000 ug | Freq: Once | INTRAMUSCULAR | Status: DC

## 2013-05-26 MED ORDER — ONDANSETRON 8 MG/50ML IVPB (CHCC)
8.0000 mg | Freq: Once | INTRAVENOUS | Status: AC
  Administered 2013-05-26: 8 mg via INTRAVENOUS

## 2013-05-26 MED ORDER — DOCETAXEL CHEMO INJECTION 160 MG/16ML
60.0000 mg/m2 | Freq: Once | INTRAVENOUS | Status: AC
  Administered 2013-05-26: 100 mg via INTRAVENOUS
  Filled 2013-05-26: qty 10

## 2013-05-26 MED ORDER — SODIUM CHLORIDE 0.9 % IJ SOLN
10.0000 mL | INTRAMUSCULAR | Status: DC | PRN
  Filled 2013-05-26: qty 10

## 2013-05-26 MED ORDER — HEPARIN SOD (PORK) LOCK FLUSH 100 UNIT/ML IV SOLN
500.0000 [IU] | Freq: Once | INTRAVENOUS | Status: DC | PRN
  Filled 2013-05-26: qty 5

## 2013-05-26 MED ORDER — ZOLPIDEM TARTRATE 5 MG PO TABS: 5.0000 mg | ORAL_TABLET | Freq: Every evening | ORAL | Status: DC | PRN

## 2013-05-26 MED ORDER — DEXAMETHASONE SODIUM PHOSPHATE 10 MG/ML IJ SOLN
10.0000 mg | Freq: Once | INTRAMUSCULAR | Status: AC
  Administered 2013-05-26: 10 mg via INTRAVENOUS

## 2013-05-26 MED ORDER — ACETAMINOPHEN 325 MG PO TABS
650.0000 mg | ORAL_TABLET | Freq: Once | ORAL | Status: AC
  Administered 2013-05-26: 650 mg via ORAL

## 2013-05-26 MED ORDER — CYANOCOBALAMIN 1000 MCG/ML IJ SOLN
INTRAMUSCULAR | Status: AC
  Filled 2013-05-26: qty 1

## 2013-05-26 MED ORDER — DIPHENHYDRAMINE HCL 50 MG/ML IJ SOLN
INTRAMUSCULAR | Status: AC
  Filled 2013-05-26: qty 1

## 2013-05-26 MED ORDER — SODIUM CHLORIDE 0.9 % IV SOLN
Freq: Once | INTRAVENOUS | Status: AC
  Administered 2013-05-26: 09:00:00 via INTRAVENOUS

## 2013-05-26 MED ORDER — DIPHENHYDRAMINE HCL 50 MG/ML IJ SOLN
50.0000 mg | Freq: Once | INTRAMUSCULAR | Status: AC
  Administered 2013-05-26: 50 mg via INTRAVENOUS

## 2013-05-26 MED ORDER — CYANOCOBALAMIN 1000 MCG/ML IJ SOLN
1000.0000 ug | Freq: Once | INTRAMUSCULAR | Status: AC
  Administered 2013-05-26: 1000 ug via INTRAMUSCULAR

## 2013-05-26 MED ORDER — ACETAMINOPHEN 325 MG PO TABS
ORAL_TABLET | ORAL | Status: AC
  Filled 2013-05-26: qty 2

## 2013-05-26 MED ORDER — SODIUM CHLORIDE 0.9 % IV SOLN
700.0000 mg | Freq: Once | INTRAVENOUS | Status: AC
  Administered 2013-05-26: 700 mg via INTRAVENOUS
  Filled 2013-05-26: qty 70

## 2013-05-26 MED ORDER — DEXAMETHASONE SODIUM PHOSPHATE 10 MG/ML IJ SOLN
INTRAMUSCULAR | Status: AC
  Filled 2013-05-26: qty 1

## 2013-05-26 NOTE — Patient Instructions (Signed)
[facility] Discharge Instructions for Patients Receiving Chemotherapy  Today you received the following chemotherapy agents Cyramza and Taxotere  To help prevent nausea and vomiting after your treatment, we encourage you to take your nausea medication    If you develop nausea and vomiting that is not controlled by your nausea medication, call the clinic.   BELOW ARE SYMPTOMS THAT SHOULD BE REPORTED IMMEDIATELY:  *FEVER GREATER THAN 100.5 F  *CHILLS WITH OR WITHOUT FEVER  NAUSEA AND VOMITING THAT IS NOT CONTROLLED WITH YOUR NAUSEA MEDICATION  *UNUSUAL SHORTNESS OF BREATH  *UNUSUAL BRUISING OR BLEEDING  TENDERNESS IN MOUTH AND THROAT WITH OR WITHOUT PRESENCE OF ULCERS  *URINARY PROBLEMS  *BOWEL PROBLEMS  UNUSUAL RASH Items with * indicate a potential emergency and should be followed up as soon as possible.  Feel free to call the clinic you have any questions or concerns. The clinic phone number is [phone].   Docetaxel injection What is this medicine? DOCETAXEL (doe se TAX el) is a chemotherapy drug. It targets fast dividing cells, like cancer cells, and causes these cells to die. This medicine is used to treat many types of cancers like breast cancer, certain stomach cancers, head and neck cancer, lung cancer, and prostate cancer. This medicine may be used for other purposes; ask your health care provider or pharmacist if you have questions. COMMON BRAND NAME(S): Docefrez , Taxotere What should I tell my health care provider before I take this medicine? They need to know if you have any of these conditions: -infection (especially a virus infection such as chickenpox, cold sores, or herpes) -liver disease -low blood counts, like low white cell, platelet, or red cell counts -an unusual or allergic reaction to docetaxel, polysorbate 80, other chemotherapy agents, other medicines, foods, dyes, or preservatives -pregnant or trying to get  pregnant -breast-feeding How should I use this medicine? This drug is given as an infusion into a vein. It is administered in a hospital or clinic by a specially trained health care professional. Talk to your pediatrician regarding the use of this medicine in children. Special care may be needed. Overdosage: If you think you have taken too much of this medicine contact a poison control center or emergency room at once. NOTE: This medicine is only for you. Do not share this medicine with others. What if I miss a dose? It is important not to miss your dose. Call your doctor or health care professional if you are unable to keep an appointment. What may interact with this medicine? -cyclosporine -erythromycin -ketoconazole -medicines to increase blood counts like filgrastim, pegfilgrastim, sargramostim -vaccines Talk to your doctor or health care professional before taking any of these medicines: -acetaminophen -aspirin -ibuprofen -ketoprofen -naproxen This list may not describe all possible interactions. Give your health care provider a list of all the medicines, herbs, non-prescription drugs, or dietary supplements you use. Also tell them if you smoke, drink alcohol, or use illegal drugs. Some items may interact with your medicine. What should I watch for while using this medicine? Your condition will be monitored carefully while you are receiving this medicine. You will need important blood work done while you are taking this medicine. This drug may make you feel generally unwell. This is not uncommon, as chemotherapy can affect healthy cells as well as cancer cells. Report any side effects. Continue your course of treatment even though you feel ill unless your doctor tells you to stop. In some cases, you may be given additional  medicines to help with side effects. Follow all directions for their use. Call your doctor or health care professional for advice if you get a fever, chills or sore  throat, or other symptoms of a cold or flu. Do not treat yourself. This drug decreases your body's ability to fight infections. Try to avoid being around people who are sick. This medicine may increase your risk to bruise or bleed. Call your doctor or health care professional if you notice any unusual bleeding. Be careful brushing and flossing your teeth or using a toothpick because you may get an infection or bleed more easily. If you have any dental work done, tell your dentist you are receiving this medicine. Avoid taking products that contain aspirin, acetaminophen, ibuprofen, naproxen, or ketoprofen unless instructed by your doctor. These medicines may hide a fever. Do not become pregnant while taking this medicine. Women should inform their doctor if they wish to become pregnant or think they might be pregnant. There is a potential for serious side effects to an unborn child. Talk to your health care professional or pharmacist for more information. Do not breast-feed an infant while taking this medicine. What side effects may I notice from receiving this medicine? Side effects that you should report to your doctor or health care professional as soon as possible: -allergic reactions like skin rash, itching or hives, swelling of the face, lips, or tongue -low blood counts - This drug may decrease the number of white blood cells, red blood cells and platelets. You may be at increased risk for infections and bleeding. -signs of infection - fever or chills, cough, sore throat, pain or difficulty passing urine -signs of decreased platelets or bleeding - bruising, pinpoint red spots on the skin, black, tarry stools, nosebleeds -signs of decreased red blood cells - unusually weak or tired, fainting spells, lightheadedness -breathing problems -fast or irregular heartbeat -low blood pressure -mouth sores -nausea and vomiting -pain, swelling, redness or irritation at the injection site -pain, tingling,  numbness in the hands or feet -swelling of the ankle, feet, hands -weight gain Side effects that usually do not require medical attention (report to your prescriber or health care professional if they continue or are bothersome): -bone pain -complete hair loss including hair on your head, underarms, pubic hair, eyebrows, and eyelashes -diarrhea -excessive tearing -changes in the color of fingernails -loosening of the fingernails -nausea -muscle pain -red flush to skin -sweating -weak or tired This list may not describe all possible side effects. Call your doctor for medical advice about side effects. You may report side effects to FDA at 1-800-FDA-1088. Where should I keep my medicine? This drug is given in a hospital or clinic and will not be stored at home. NOTE: This sheet is a summary. It may not cover all possible information. If you have questions about this medicine, talk to your doctor, pharmacist, or health care provider.  2014, [employer]vier/Gold Standard. (2007-12-16 11:52:10)

## 2013-05-26 NOTE — Progress Notes (Signed)
Hematology and Oncology Follow Up Visit  [name] [id number] 11-07-1955 58 y.o. 05/26/2013   Principle Diagnosis:   Metastatic adenocarcinoma of the lung-EGFR positive  Invasive lobular carcinoma the right breast  Current Therapy:    Status post cycle 1 of Taxotere/Cyramza  Aromasin 25 mg by mouth daily  Zometa 4 mg IV every 3 weeks     Interim History:  [name] is back for followup. And 30, a recent  MRI of the brain showed new brain metastases. They were numerous. There were about 21 lesions. They're all quite small. There is very little edema. She saw radiation oncology. They will consider whole brain radiation.  After her first cycle of chemotherapy, she did have some mouth sores. They were treated with oral rinses.  She does still tired. She has looks better kyphos would look. She still trying to work and she feels work is important for her.  She's had no nausea vomiting has been a little constipation. There's been no rash suspicious had no leg swelling. She's had no double vision or blurred vision. She's had no problems with her speech. She does have an occasional word finding issue.  Medications: Current outpatient prescriptions:ALPRAZolam (XANAX) 0.25 MG tablet, Take 0.25-0.5 mg by mouth as needed. For anxiety., Disp: , Rfl: ;  amLODipine (NORVASC) 2.5 MG tablet, Take 2.5 mg by mouth daily., Disp: , Rfl: ;  dexamethasone (DECADRON) 4 MG tablet, Take 2 pills twice a day for 5 days.  Start day before chemo and take for 5 days only., Disp: 60 tablet, Rfl: 3 dexamethasone (DECADRON) 4 MG tablet, Take 1 tablet BID, except on days when taking this as prescribed for chemotherapy, Disp: 60 tablet, Rfl: 0;  exemestane (AROMASIN) 25 MG tablet, TAKE 1 TABLET BY MOUTH IN THE MORNING AFTER BREAKFAST, Disp: 30 tablet, Rfl: 6;  HYDROcodone-acetaminophen (NORCO) 10-325 MG per tablet, Take 1 tablet by mouth every 6 (six) hours as needed., Disp: 120 tablet, Rfl: 0 ibuprofen  (ADVIL,MOTRIN) 200 MG tablet, Take 600 mg by mouth every 6 (six) hours as needed for pain. , Disp: , Rfl: ;  lidocaine-prilocaine (EMLA) cream, Apply topically as needed. , Disp: , Rfl: ;  loperamide (IMODIUM) 2 MG capsule, Take 2 mg by mouth 4 (four) times daily as needed. For diarrhea, Disp: , Rfl: ;  LORazepam (ATIVAN) 0.5 MG tablet, , Disp: , Rfl:  LORazepam (ATIVAN) 0.5 MG tablet, Take 1 tablet (0.5 mg total) by mouth every 6 (six) hours as needed (Nausea or vomiting)., Disp: 30 tablet, Rfl: 0;  methylphenidate (RITALIN) 5 MG tablet, Take 1 pill TWICE a day., Disp: 60 tablet, Rfl: 0;  naphazoline (NAPHCON) 0.1 % ophthalmic solution, Place 1 drop into both eyes 4 (four) times daily as needed., Disp: 15 mL, Rfl: 2 ondansetron (ZOFRAN) 8 MG tablet, Take 1 tablet (8 mg total) by mouth 2 (two) times daily. Start the day after chemo, then as needed for nausea or vomiting., Disp: 30 tablet, Rfl: 1;  Probiotic Product (ALIGN) 4 MG CAPS, Take by mouth every morning., Disp: , Rfl: ;  prochlorperazine (COMPAZINE) 10 MG tablet, TAKE 1 TABLET (10 MG TOTAL) BY MOUTH EVERY 6 (SIX) HOURS AS NEEDED FOR NAUSEA OR VOMITING., Disp: 30 tablet, Rfl: 1 zolendronic acid (ZOMETA) 4 MG/5ML injection, Inject 4 mg into the vein every 30 (thirty) days., Disp: , Rfl: ;  zolpidem (AMBIEN) 5 MG tablet, Take 5 mg by mouth at bedtime as needed for sleep., Disp: , Rfl:  No current  facility-administered medications for this visit. Facility-Administered Medications Ordered in Other Visits: sodium chloride 0.9 % injection 10 mL, 10 mL, Intracatheter, PRN, Volanda Napoleon, MD, 10 mL at 09/04/12 1511  Allergies: No Known Allergies  Past Medical History, Surgical history, Social history, and Family History were reviewed and updated.  Review of Systems: As above  Physical Exam:  vitals were not taken for this visit.  Well-developed and well-nourished white female. Head and neck exam shows no ocular or oral lesion. She has no adenopathy  in the neck. Pupils reacted properly. Lungs are clear. Cardiac exam with her tachycardia but regular. There are no murmurs rubs or bruits. Abdomen is soft. Has good bowel sounds. There is no fluid wave. There is no palpable liver or spleen tip. Back exam no tenderness over the spine ribs or hips. Extremities shows no clubbing cyanosis or edema. Has good muscle strength in upper lower extremities. Skin exam no rashes. Neurological exam shows no focal neurological deficits.  Lab Results  Component Value Date   WBC 18.3* 05/26/2013   HGB 9.9* 05/26/2013   HCT 30.1* 05/26/2013   MCV 100 05/26/2013   PLT 223 05/26/2013     Chemistry      Component Value Date/Time   NA 139 05/08/2013 0840   NA 143 08/14/2012 0907   K 3.8 05/08/2013 0840   K 3.9 08/14/2012 0907   CL 99 05/08/2013 0840   CL 106 08/14/2012 0907   CO2 27 05/08/2013 0840   CO2 25 08/14/2012 0907   BUN 29* 05/08/2013 0840   BUN 28.1* 05/02/2013 1227   BUN 10 08/14/2012 0907   CREATININE 1.6* 05/08/2013 0840   CREATININE 1.5* 05/02/2013 1227   CREATININE 0.71 08/14/2012 0907      Component Value Date/Time   CALCIUM 8.3 05/08/2013 0840   CALCIUM 9.0 08/14/2012 0907   ALKPHOS 112* 05/08/2013 0840   ALKPHOS 270* 08/14/2012 0907   AST 23 05/08/2013 0840   AST 31 08/14/2012 0907   ALT 34 05/08/2013 0840   ALT 37* 08/14/2012 0907   BILITOT 0.50 05/08/2013 0840   BILITOT 0.2* 08/14/2012 0907         Impression and Plan: [name] is 58 year old white female with metastatic adenocarcinoma of the lung." Of note, the tumor is EGFR positive. She has already been on TKI therapy. She had a very limited response. We've had her on first-line chemotherapy. She responded nicely but then progressed. She now cells Chemotherapy.  Hopefully, we will see a response with this second line of chemotherapy.  We'll go ahead and do a CT scan after this second cycle of chemotherapy so that we can see if she responds.  I think we see a good response, and that she  tolerated treatment well, we should continue with systemic chemotherapy. She is asymptomatic with these brain metastases. I just want to make her that we continue to treat systemically and keep this under good control.  We will go ahead and plan to get her back in 3 more weeks.  I spent a good 30 minutes with she and her husband today.   Volanda Napoleon, MD 5/11/20158:42 AM

## 2013-05-27 ENCOUNTER — Ambulatory Visit (HOSPITAL_BASED_OUTPATIENT_CLINIC_OR_DEPARTMENT_OTHER): Payer: BC Managed Care – PPO

## 2013-05-27 ENCOUNTER — Institutional Professional Consult (permissible substitution): Payer: BC Managed Care – PPO | Admitting: Radiation Oncology

## 2013-05-27 VITALS — BP 151/96 | HR 102 | Temp 96.8°F | Resp 16 | Ht 62.0 in | Wt 148.2 lb

## 2013-05-27 DIAGNOSIS — Z5189 Encounter for other specified aftercare: Secondary | ICD-10-CM

## 2013-05-27 DIAGNOSIS — C7949 Secondary malignant neoplasm of other parts of nervous system: Secondary | ICD-10-CM

## 2013-05-27 DIAGNOSIS — C3490 Malignant neoplasm of unspecified part of unspecified bronchus or lung: Secondary | ICD-10-CM

## 2013-05-27 DIAGNOSIS — C7931 Secondary malignant neoplasm of brain: Secondary | ICD-10-CM

## 2013-05-27 DIAGNOSIS — C3430 Malignant neoplasm of lower lobe, unspecified bronchus or lung: Secondary | ICD-10-CM

## 2013-05-27 MED ORDER — PEGFILGRASTIM INJECTION 6 MG/0.6ML
6.0000 mg | Freq: Once | SUBCUTANEOUS | Status: AC
  Administered 2013-05-27: 6 mg via SUBCUTANEOUS

## 2013-05-27 MED ORDER — PEGFILGRASTIM INJECTION 6 MG/0.6ML
SUBCUTANEOUS | Status: AC
  Filled 2013-05-27: qty 0.6

## 2013-05-27 NOTE — Progress Notes (Signed)
Simulation Verification Note Outpatient 04-29-13  The patient was brought to the treatment unit and placed in the planned treatment position. The clinical setup was verified. Then port films were obtained and uploaded to the radiation oncology medical record software.  The treatment beams were carefully compared against the planned radiation fields. The position location and shape of the radiation fields was reviewed. They targeted volume of tissue appears to be appropriately covered by the radiation beams. Organs at risk appear to be excluded as planned.  Based on my personal review, I approved the simulation verification. The patient's treatment will proceed as planned.  -----------------------------------  Eppie Gibson, MD

## 2013-05-27 NOTE — Patient Instructions (Signed)

## 2013-06-02 ENCOUNTER — Telehealth: Payer: Self-pay | Admitting: *Deleted

## 2013-06-02 NOTE — Telephone Encounter (Signed)
Patient called stating that she had extreme bone pain after the Neulast last treatment where pain lasted for 7 days and was unable to walk.  Patient took her Hydrocodone 10/325 every 3 hours for this with not much help.  After talking with Dr. Marin Olp suggested patient add Claritin to this daily.  Patient to see Dr. Marin Olp before next chemo so advised her to ask Dr. Marin Olp what she can take for pain for next cycle in the event of extreme pain with Neulasta

## 2013-06-10 ENCOUNTER — Other Ambulatory Visit (HOSPITAL_BASED_OUTPATIENT_CLINIC_OR_DEPARTMENT_OTHER): Payer: BC Managed Care – PPO | Admitting: Lab

## 2013-06-10 DIAGNOSIS — C7931 Secondary malignant neoplasm of brain: Secondary | ICD-10-CM

## 2013-06-10 DIAGNOSIS — C3490 Malignant neoplasm of unspecified part of unspecified bronchus or lung: Secondary | ICD-10-CM

## 2013-06-10 DIAGNOSIS — C7949 Secondary malignant neoplasm of other parts of nervous system: Secondary | ICD-10-CM

## 2013-06-10 DIAGNOSIS — C3430 Malignant neoplasm of lower lobe, unspecified bronchus or lung: Secondary | ICD-10-CM

## 2013-06-10 LAB — CBC WITH DIFFERENTIAL (CANCER CENTER ONLY)
BASO#: 0.1 10*3/uL (ref 0.0–0.2)
BASO%: 0.6 % (ref 0.0–2.0)
EOS%: 0.1 % (ref 0.0–7.0)
Eosinophils Absolute: 0 10*3/uL (ref 0.0–0.5)
HEMATOCRIT: 28.1 % — AB (ref 34.8–46.6)
HEMOGLOBIN: 9 g/dL — AB (ref 11.6–15.9)
LYMPH#: 0.8 10*3/uL — ABNORMAL LOW (ref 0.9–3.3)
LYMPH%: 4.7 % — ABNORMAL LOW (ref 14.0–48.0)
MCH: 32.3 pg (ref 26.0–34.0)
MCHC: 32 g/dL (ref 32.0–36.0)
MCV: 101 fL (ref 81–101)
MONO#: 1.6 10*3/uL — AB (ref 0.1–0.9)
MONO%: 8.8 % (ref 0.0–13.0)
NEUT%: 85.8 % — AB (ref 39.6–80.0)
NEUTROS ABS: 15.1 10*3/uL — AB (ref 1.5–6.5)
Platelets: 287 10*3/uL (ref 145–400)
RBC: 2.79 10*6/uL — ABNORMAL LOW (ref 3.70–5.32)
RDW: 17.3 % — ABNORMAL HIGH (ref 11.1–15.7)
WBC: 17.6 10*3/uL — AB (ref 3.9–10.0)

## 2013-06-10 LAB — CMP (CANCER CENTER ONLY)
ALBUMIN: 2.4 g/dL — AB (ref 3.3–5.5)
ALT(SGPT): 40 U/L (ref 10–47)
AST: 43 U/L — AB (ref 11–38)
Alkaline Phosphatase: 149 U/L — ABNORMAL HIGH (ref 26–84)
BILIRUBIN TOTAL: 0.7 mg/dL (ref 0.20–1.60)
BUN, Bld: 16 mg/dL (ref 7–22)
CO2: 27 mEq/L (ref 18–33)
Calcium: 9.1 mg/dL (ref 8.0–10.3)
Chloride: 95 mEq/L — ABNORMAL LOW (ref 98–108)
Creat: 1.7 mg/dl — ABNORMAL HIGH (ref 0.6–1.2)
GLUCOSE: 105 mg/dL (ref 73–118)
POTASSIUM: 3.9 meq/L (ref 3.3–4.7)
Sodium: 134 mEq/L (ref 128–145)
TOTAL PROTEIN: 7 g/dL (ref 6.4–8.1)

## 2013-06-10 LAB — LACTATE DEHYDROGENASE: LDH: 466 U/L — ABNORMAL HIGH (ref 94–250)

## 2013-06-10 LAB — HOLD TUBE, BLOOD BANK - CHCC SATELLITE

## 2013-06-11 ENCOUNTER — Telehealth: Payer: Self-pay | Admitting: Hematology & Oncology

## 2013-06-11 ENCOUNTER — Other Ambulatory Visit: Payer: Self-pay | Admitting: *Deleted

## 2013-06-11 ENCOUNTER — Ambulatory Visit
Admission: RE | Admit: 2013-06-11 | Discharge: 2013-06-11 | Disposition: A | Discharge status: Home / Self Care | Payer: BC Managed Care – PPO | Source: Ambulatory Visit | Attending: Radiation Oncology | Admitting: Radiation Oncology

## 2013-06-11 ENCOUNTER — Other Ambulatory Visit: Payer: Self-pay | Admitting: Hematology & Oncology

## 2013-06-11 DIAGNOSIS — R000 Tachycardia, unspecified: Secondary | ICD-10-CM | POA: Insufficient documentation

## 2013-06-11 DIAGNOSIS — C7949 Secondary malignant neoplasm of other parts of nervous system: Secondary | ICD-10-CM

## 2013-06-11 DIAGNOSIS — C50919 Malignant neoplasm of unspecified site of unspecified female breast: Secondary | ICD-10-CM | POA: Insufficient documentation

## 2013-06-11 DIAGNOSIS — C3490 Malignant neoplasm of unspecified part of unspecified bronchus or lung: Secondary | ICD-10-CM | POA: Insufficient documentation

## 2013-06-11 DIAGNOSIS — C7931 Secondary malignant neoplasm of brain: Secondary | ICD-10-CM

## 2013-06-11 DIAGNOSIS — R112 Nausea with vomiting, unspecified: Secondary | ICD-10-CM | POA: Insufficient documentation

## 2013-06-11 DIAGNOSIS — M533 Sacrococcygeal disorders, not elsewhere classified: Secondary | ICD-10-CM | POA: Insufficient documentation

## 2013-06-11 DIAGNOSIS — R5381 Other malaise: Secondary | ICD-10-CM | POA: Insufficient documentation

## 2013-06-11 DIAGNOSIS — R5383 Other fatigue: Secondary | ICD-10-CM

## 2013-06-11 DIAGNOSIS — Z510 Encounter for antineoplastic radiation therapy: Secondary | ICD-10-CM | POA: Insufficient documentation

## 2013-06-11 NOTE — Progress Notes (Signed)
Simulation / Treatment Planning Note OUTPATIENT Brain metastases  The patient was taken to her CT simulator and laid in the supine position. An Aquaplast facemask was made and custom fitted to the patient's anatomy. High-resolution CT axial imaging was obtained of the patient's brain. An isocenter was placed in the brain. Markings were made on the patient's mask and the patient tolerated this well.  Treatment planning note: I plan to treat the patient's brain with 2 opposed lateral beams using MLCs for custom blocks against her eyes and pharynx. I plan to prescribe 35 Gray in 14 fractions to the whole brain. Isodose plan requested from dosimetry.  -----------------------------------  Eppie Gibson, MD

## 2013-06-11 NOTE — Telephone Encounter (Signed)
Pt aware of 5-28 appointments and CT time change and to be NPO

## 2013-06-12 ENCOUNTER — Ambulatory Visit (HOSPITAL_COMMUNITY)
Admission: RE | Admit: 2013-06-12 | Discharge: 2013-06-12 | Disposition: A | Discharge status: Home / Self Care | Payer: BC Managed Care – PPO | Source: Ambulatory Visit | Attending: Hematology & Oncology | Admitting: Hematology & Oncology

## 2013-06-12 ENCOUNTER — Ambulatory Visit: Payer: BC Managed Care – PPO

## 2013-06-12 ENCOUNTER — Other Ambulatory Visit (HOSPITAL_BASED_OUTPATIENT_CLINIC_OR_DEPARTMENT_OTHER): Payer: BC Managed Care – PPO | Admitting: Lab

## 2013-06-12 ENCOUNTER — Ambulatory Visit (HOSPITAL_BASED_OUTPATIENT_CLINIC_OR_DEPARTMENT_OTHER): Payer: BC Managed Care – PPO

## 2013-06-12 ENCOUNTER — Ambulatory Visit (HOSPITAL_BASED_OUTPATIENT_CLINIC_OR_DEPARTMENT_OTHER): Admission: RE | Admit: 2013-06-12 | Payer: BC Managed Care – PPO | Source: Ambulatory Visit

## 2013-06-12 ENCOUNTER — Ambulatory Visit (HOSPITAL_COMMUNITY): Payer: BC Managed Care – PPO

## 2013-06-12 ENCOUNTER — Ambulatory Visit (INDEPENDENT_AMBULATORY_CARE_PROVIDER_SITE_OTHER): Payer: BC Managed Care – PPO

## 2013-06-12 ENCOUNTER — Encounter: Payer: Self-pay | Admitting: Hematology & Oncology

## 2013-06-12 VITALS — BP 149/84 | HR 100 | Temp 97.4°F | Resp 16

## 2013-06-12 DIAGNOSIS — D649 Anemia, unspecified: Secondary | ICD-10-CM | POA: Insufficient documentation

## 2013-06-12 DIAGNOSIS — C3490 Malignant neoplasm of unspecified part of unspecified bronchus or lung: Secondary | ICD-10-CM

## 2013-06-12 DIAGNOSIS — C7949 Secondary malignant neoplasm of other parts of nervous system: Secondary | ICD-10-CM

## 2013-06-12 DIAGNOSIS — R6889 Other general symptoms and signs: Secondary | ICD-10-CM

## 2013-06-12 DIAGNOSIS — C801 Malignant (primary) neoplasm, unspecified: Secondary | ICD-10-CM

## 2013-06-12 DIAGNOSIS — C7931 Secondary malignant neoplasm of brain: Secondary | ICD-10-CM

## 2013-06-12 DIAGNOSIS — C3430 Malignant neoplasm of lower lobe, unspecified bronchus or lung: Secondary | ICD-10-CM

## 2013-06-12 DIAGNOSIS — R599 Enlarged lymph nodes, unspecified: Secondary | ICD-10-CM

## 2013-06-12 DIAGNOSIS — C771 Secondary and unspecified malignant neoplasm of intrathoracic lymph nodes: Secondary | ICD-10-CM

## 2013-06-12 DIAGNOSIS — R222 Localized swelling, mass and lump, trunk: Secondary | ICD-10-CM

## 2013-06-12 DIAGNOSIS — C7951 Secondary malignant neoplasm of bone: Secondary | ICD-10-CM

## 2013-06-12 DIAGNOSIS — C7970 Secondary malignant neoplasm of unspecified adrenal gland: Secondary | ICD-10-CM

## 2013-06-12 DIAGNOSIS — J90 Pleural effusion, not elsewhere classified: Secondary | ICD-10-CM

## 2013-06-12 DIAGNOSIS — C7952 Secondary malignant neoplasm of bone marrow: Secondary | ICD-10-CM

## 2013-06-12 LAB — CBC WITH DIFFERENTIAL (CANCER CENTER ONLY)
BASO#: 0.1 10*3/uL (ref 0.0–0.2)
BASO%: 0.6 % (ref 0.0–2.0)
EOS%: 0.1 % (ref 0.0–7.0)
Eosinophils Absolute: 0 10*3/uL (ref 0.0–0.5)
HCT: 27.4 % — ABNORMAL LOW (ref 34.8–46.6)
HEMOGLOBIN: 8.9 g/dL — AB (ref 11.6–15.9)
LYMPH#: 0.4 10*3/uL — AB (ref 0.9–3.3)
LYMPH%: 3.2 % — ABNORMAL LOW (ref 14.0–48.0)
MCH: 32.8 pg (ref 26.0–34.0)
MCHC: 32.5 g/dL (ref 32.0–36.0)
MCV: 101 fL (ref 81–101)
MONO#: 1.5 10*3/uL — AB (ref 0.1–0.9)
MONO%: 10.8 % (ref 0.0–13.0)
NEUT#: 11.8 10*3/uL — ABNORMAL HIGH (ref 1.5–6.5)
NEUT%: 85.3 % — AB (ref 39.6–80.0)
Platelets: 334 10*3/uL (ref 145–400)
RBC: 2.71 10*6/uL — ABNORMAL LOW (ref 3.70–5.32)
RDW: 17.5 % — AB (ref 11.1–15.7)
WBC: 13.9 10*3/uL — AB (ref 3.9–10.0)

## 2013-06-12 LAB — CMP (CANCER CENTER ONLY)
ALBUMIN: 2.4 g/dL — AB (ref 3.3–5.5)
ALT(SGPT): 26 U/L (ref 10–47)
AST: 33 U/L (ref 11–38)
Alkaline Phosphatase: 127 U/L — ABNORMAL HIGH (ref 26–84)
BILIRUBIN TOTAL: 0.6 mg/dL (ref 0.20–1.60)
BUN, Bld: 17 mg/dL (ref 7–22)
CALCIUM: 8.5 mg/dL (ref 8.0–10.3)
CO2: 24 meq/L (ref 18–33)
Chloride: 98 mEq/L (ref 98–108)
Creat: 1.6 mg/dl — ABNORMAL HIGH (ref 0.6–1.2)
GLUCOSE: 97 mg/dL (ref 73–118)
Potassium: 3.8 mEq/L (ref 3.3–4.7)
SODIUM: 134 meq/L (ref 128–145)
TOTAL PROTEIN: 6.4 g/dL (ref 6.4–8.1)

## 2013-06-12 LAB — HOLD TUBE, BLOOD BANK - CHCC SATELLITE

## 2013-06-12 LAB — PREPARE RBC (CROSSMATCH)

## 2013-06-12 MED ORDER — IOHEXOL 300 MG/ML  SOLN
80.0000 mL | Freq: Once | INTRAMUSCULAR | Status: AC | PRN
  Administered 2013-06-12: 80 mL via INTRAVENOUS

## 2013-06-12 MED ORDER — SODIUM CHLORIDE 0.9 % IJ SOLN
10.0000 mL | INTRAMUSCULAR | Status: AC | PRN
  Administered 2013-06-12: 10 mL
  Filled 2013-06-12: qty 10

## 2013-06-12 MED ORDER — DIPHENHYDRAMINE HCL 25 MG PO CAPS
25.0000 mg | ORAL_CAPSULE | Freq: Once | ORAL | Status: AC
Start: 2013-06-12 — End: 2013-06-12
  Administered 2013-06-12: 25 mg via ORAL

## 2013-06-12 MED ORDER — HEPARIN SOD (PORK) LOCK FLUSH 100 UNIT/ML IV SOLN
500.0000 [IU] | Freq: Every day | INTRAVENOUS | Status: AC | PRN
  Administered 2013-06-12: 500 [IU]
  Filled 2013-06-12: qty 5

## 2013-06-12 MED ORDER — ACETAMINOPHEN 325 MG PO TABS
ORAL_TABLET | ORAL | Status: AC
  Filled 2013-06-12: qty 2

## 2013-06-12 MED ORDER — HEPARIN SOD (PORK) LOCK FLUSH 100 UNIT/ML IV SOLN
500.0000 [IU] | Freq: Once | INTRAVENOUS | Status: DC
Start: 2013-06-12 — End: 2013-06-12
  Filled 2013-06-12: qty 5

## 2013-06-12 MED ORDER — SODIUM CHLORIDE 0.9 % IV SOLN
250.0000 mL | Freq: Once | INTRAVENOUS | Status: AC
  Administered 2013-06-12: 250 mL via INTRAVENOUS

## 2013-06-12 MED ORDER — FUROSEMIDE 10 MG/ML IJ SOLN
20.0000 mg | Freq: Once | INTRAMUSCULAR | Status: AC
  Administered 2013-06-12: 20 mg via INTRAVENOUS

## 2013-06-12 MED ORDER — FUROSEMIDE 10 MG/ML IJ SOLN
INTRAMUSCULAR | Status: AC
  Filled 2013-06-12: qty 4

## 2013-06-12 MED ORDER — DIPHENHYDRAMINE HCL 25 MG PO CAPS
ORAL_CAPSULE | ORAL | Status: AC
  Filled 2013-06-12: qty 1

## 2013-06-12 MED ORDER — SODIUM CHLORIDE 0.9 % IJ SOLN
10.0000 mL | INTRAMUSCULAR | Status: DC | PRN
  Administered 2013-06-12: 10 mL via INTRAVENOUS
  Filled 2013-06-12: qty 10

## 2013-06-12 MED ORDER — ACETAMINOPHEN 325 MG PO TABS
650.0000 mg | ORAL_TABLET | Freq: Once | ORAL | Status: AC
  Administered 2013-06-12: 650 mg via ORAL

## 2013-06-12 NOTE — Progress Notes (Signed)
Hematology and Oncology Follow Up Visit  [name] [id number] 11-22-1955 58 y.o. 06/12/2013   Principle Diagnosis:   Metastatic poorly differentiated carcinoma the lung  Current Therapy:    Patient status post 2 cycles of Taxotere/Cyramza  Zometa 4 mg IV q. month  Patient's to start cranial radiation therapy for brain metastases     Interim History:  [name] is is important in a scheduled visit. JAK2 is a getting a blood transfusion. We did CAT scans on her today. For same disease is progressing. She has significant progression. She's short of breath. She is fatigue. She  is beginning to suffer from the cancer itself.  She has multiple brain metastases. The last MRI showed about 20 brain metastases. They have a, she does not have much in way of symptoms from these. She's not hurting. She has been good pain control interventions.  Her performance status is ECOG 1.  Her appetite is down. Patient had no cough. There is no bleed. There is no diarrhea. She's had no leg swelling.  Medications: Current outpatient prescriptions:ALPRAZolam (XANAX) 0.25 MG tablet, Take 0.25-0.5 mg by mouth as needed. For anxiety., Disp: , Rfl: ;  amLODipine (NORVASC) 2.5 MG tablet, Take 2.5 mg by mouth daily., Disp: , Rfl: ;  dexamethasone (DECADRON) 4 MG tablet, Take 2 pills twice a day for 5 days.  Start day before chemo and take for 5 days only., Disp: 60 tablet, Rfl: 3 dexamethasone (DECADRON) 4 MG tablet, Take 1 tablet BID, except on days when taking this as prescribed for chemotherapy, Disp: 60 tablet, Rfl: 0;  exemestane (AROMASIN) 25 MG tablet, TAKE 1 TABLET BY MOUTH IN THE MORNING AFTER BREAKFAST, Disp: 30 tablet, Rfl: 6;  HYDROcodone-acetaminophen (NORCO) 10-325 MG per tablet, Take 1 tablet by mouth every 6 (six) hours as needed., Disp: 120 tablet, Rfl: 0 ibuprofen (ADVIL,MOTRIN) 200 MG tablet, Take 600 mg by mouth every 6 (six) hours as needed for pain. , Disp: , Rfl: ;   lidocaine-prilocaine (EMLA) cream, Apply topically as needed. , Disp: , Rfl: ;  loperamide (IMODIUM) 2 MG capsule, Take 2 mg by mouth 4 (four) times daily as needed. For diarrhea, Disp: , Rfl: ;  LORazepam (ATIVAN) 0.5 MG tablet, , Disp: , Rfl:  methylphenidate (RITALIN) 5 MG tablet, Take 1 pill TWICE a day., Disp: 60 tablet, Rfl: 0;  naphazoline (NAPHCON) 0.1 % ophthalmic solution, Place 1 drop into both eyes 4 (four) times daily as needed., Disp: 15 mL, Rfl: 2;  Probiotic Product (ALIGN) 4 MG CAPS, Take by mouth every morning., Disp: , Rfl:  prochlorperazine (COMPAZINE) 10 MG tablet, TAKE 1 TABLET (10 MG TOTAL) BY MOUTH EVERY 6 (SIX) HOURS AS NEEDED FOR NAUSEA OR VOMITING., Disp: 30 tablet, Rfl: 1;  zolendronic acid (ZOMETA) 4 MG/5ML injection, Inject 4 mg into the vein every 30 (thirty) days., Disp: , Rfl: ;  zolpidem (AMBIEN) 5 MG tablet, Take 1 tablet (5 mg total) by mouth at bedtime as needed for sleep., Disp: 30 tablet, Rfl: 2 Current facility-administered medications:heparin lock flush 100 unit/mL, 500 Units, Intracatheter, Daily PRN, Volanda Napoleon, MD;  sodium chloride 0.9 % injection 10 mL, 10 mL, Intracatheter, PRN, Volanda Napoleon, MD Facility-Administered Medications Ordered in Other Visits: sodium chloride 0.9 % injection 10 mL, 10 mL, Intracatheter, PRN, Volanda Napoleon, MD, 10 mL at 09/04/12 1511  Allergies: No Known Allergies  Past Medical History, Surgical history, Social history, and Family History were reviewed and updated.  Review of  Systems: As above  Physical Exam:  oral temperature is 98 F (36.7 C). Her blood pressure is 129/84 and her pulse is 105. Her respiration is 16.   Somewhat chronically ill-appearing white female. Her head and neck exam shows no ocular or oral lesions. She has no mucositis. There is no thrush. Neck is supple with no lymph nodes. Lungs are with some decreased breath sounds over on the left side. Right side has decent breath sounds. Cardiac exam  tachycardia regular. She has no murmurs rubs or bruits. Abdomen is soft. Has good bowel sounds. Is no fluid wave. There is no palpable liver or spleen tip. Extremities shows no clubbing cyanosis or edema. Strength is 4+/5 bilaterally. She has decent range of motion of her joints. Skin exam is slightly dry. Neurological exam shows no focal neurological deficits.  Lab Results  Component Value Date   WBC 13.9* 06/12/2013   HGB 8.9* 06/12/2013   HCT 27.4* 06/12/2013   MCV 101 06/12/2013   PLT 334 06/12/2013     Chemistry      Component Value Date/Time   NA 134 06/12/2013 0915   NA 143 08/14/2012 0907   K 3.8 06/12/2013 0915   K 3.9 08/14/2012 0907   CL 98 06/12/2013 0915   CL 106 08/14/2012 0907   CO2 24 06/12/2013 0915   CO2 25 08/14/2012 0907   BUN 17 06/12/2013 0915   BUN 28.1* 05/02/2013 1227   BUN 10 08/14/2012 0907   CREATININE 1.6* 06/12/2013 0915   CREATININE 1.5* 05/02/2013 1227   CREATININE 0.71 08/14/2012 0907      Component Value Date/Time   CALCIUM 8.5 06/12/2013 0915   CALCIUM 9.0 08/14/2012 0907   ALKPHOS 127* 06/12/2013 0915   ALKPHOS 270* 08/14/2012 0907   AST 33 06/12/2013 0915   AST 31 08/14/2012 0907   ALT 26 06/12/2013 0915   ALT 37* 08/14/2012 0907   BILITOT 0.60 06/12/2013 0915   BILITOT 0.2* 08/14/2012 0907         Impression and Plan: [name] is 58 year old white female with metastatic poorly differentiated carcinoma. This is of the lung. She's been through 3 different lines of chemotherapy. She's progressing.  I think the fact we'll have 1 option left. The FDA and recently approved the PD-1 inhibitor, Nivolumab. I think this might be the or the we could try on her. I don't think additional chemotherapy really would be helpful.  I had a long talk with her. Has been good and 40 minutes with her. I will have to call her husband as he was in physical therapy. I told her that the response rate probably would be no more than 20%. Any response that we see may be transient.  Hopefully, we might go to get a response that could be quick and coming as this might help her symptoms.  She is no on any oxygen at home. I offered this to her.  She now is down from her initial diagnosis by a year or more. She has done incredibly well. I does have a feeling that we might be looking at a lung cancer that is now changed biologically. If we cannot get a response, and then I have a feeling that her prognosis is going to be no more than 3 months.  Hopefully, we can start her treatment tomorrow.  I know that she has radiation therapy set up for her cranial metastases. I don't think this will interfere with the radiation therapy.  We will  try to get things started tomorrow.   Volanda Napoleon, MD 5/28/20152:36 PM

## 2013-06-12 NOTE — Patient Instructions (Signed)
Blood Transfusion Information WHAT IS A BLOOD TRANSFUSION? A transfusion is the replacement of blood or some of its parts. Blood is made up of multiple cells which provide different functions.  Red blood cells carry oxygen and are used for blood loss replacement.  White blood cells fight against infection.  Platelets control bleeding.  Plasma helps clot blood.  Other blood products are available for specialized needs, such as hemophilia or other clotting disorders. BEFORE THE TRANSFUSION  Who gives blood for transfusions?   You may be able to donate blood to be used at a later date on yourself (autologous donation).  Relatives can be asked to donate blood. This is generally not any safer than if you have received blood from a stranger. The same precautions are taken to ensure safety when a relative's blood is donated.  Healthy volunteers who are fully evaluated to make sure their blood is safe. This is blood bank blood. Transfusion therapy is the safest it has ever been in the practice of medicine. Before blood is taken from a donor, a complete history is taken to make sure that person has no history of diseases nor engages in risky social behavior (examples are intravenous drug use or sexual activity with multiple partners). The donor's travel history is screened to minimize risk of transmitting infections, such as malaria. The donated blood is tested for signs of infectious diseases, such as HIV and hepatitis. The blood is then tested to be sure it is compatible with you in order to minimize the chance of a transfusion reaction. If you or a relative donates blood, this is often done in anticipation of surgery and is not appropriate for emergency situations. It takes many days to process the donated blood. RISKS AND COMPLICATIONS Although transfusion therapy is very safe and saves many lives, the main dangers of transfusion include:   Getting an infectious disease.  Developing a  transfusion reaction. This is an allergic reaction to something in the blood you were given. Every precaution is taken to prevent this. The decision to have a blood transfusion has been considered carefully by your caregiver before blood is given. Blood is not given unless the benefits outweigh the risks. AFTER THE TRANSFUSION  Right after receiving a blood transfusion, you will usually feel much better and more energetic. This is especially true if your red blood cells have gotten low (anemic). The transfusion raises the level of the red blood cells which carry oxygen, and this usually causes an energy increase.  The nurse administering the transfusion will monitor you carefully for complications. HOME CARE INSTRUCTIONS  No special instructions are needed after a transfusion. You may find your energy is better. Speak with your caregiver about any limitations on activity for underlying diseases you may have. SEEK MEDICAL CARE IF:   Your condition is not improving after your transfusion.  You develop redness or irritation at the intravenous (IV) site. SEEK IMMEDIATE MEDICAL CARE IF:  Any of the following symptoms occur over the next 12 hours:  Shaking chills.  You have a temperature by mouth above 102 F (38.9 C), not controlled by medicine.  Chest, back, or muscle pain.  People around you feel you are not acting correctly or are confused.  Shortness of breath or difficulty breathing.  Dizziness and fainting.  You get a rash or develop hives.  You have a decrease in urine output.  Your urine turns a dark color or changes to pink, red, or brown. Any of the following   symptoms occur over the next 10 days:  You have a temperature by mouth above 102 F (38.9 C), not controlled by medicine.  Shortness of breath.  Weakness after normal activity.  The white part of the eye turns yellow (jaundice).  You have a decrease in the amount of urine or are urinating less often.  Your  urine turns a dark color or changes to pink, red, or brown. Document Released: 12/31/1999 Document Revised: 03/27/2011 Document Reviewed: 08/19/2007 ExitCare Patient Information 2014 [employer].  

## 2013-06-12 NOTE — Progress Notes (Signed)
PAC access with power port needle & flushed with 10cc NS only as pt is having CT scan immediately following. dph

## 2013-06-13 ENCOUNTER — Ambulatory Visit (HOSPITAL_BASED_OUTPATIENT_CLINIC_OR_DEPARTMENT_OTHER): Payer: BC Managed Care – PPO

## 2013-06-13 ENCOUNTER — Encounter: Payer: Self-pay | Admitting: Hematology & Oncology

## 2013-06-13 VITALS — BP 145/97 | HR 111 | Temp 97.7°F | Resp 16

## 2013-06-13 DIAGNOSIS — Z5112 Encounter for antineoplastic immunotherapy: Secondary | ICD-10-CM

## 2013-06-13 DIAGNOSIS — C3430 Malignant neoplasm of lower lobe, unspecified bronchus or lung: Secondary | ICD-10-CM

## 2013-06-13 DIAGNOSIS — C3490 Malignant neoplasm of unspecified part of unspecified bronchus or lung: Secondary | ICD-10-CM

## 2013-06-13 LAB — TYPE AND SCREEN
ABO/RH(D): O POS
ANTIBODY SCREEN: NEGATIVE
UNIT DIVISION: 0
Unit division: 0

## 2013-06-13 MED ORDER — NIVOLUMAB CHEMO INJECTION 100 MG/10ML
3.0000 mg/kg | Freq: Once | INTRAVENOUS | Status: AC
  Administered 2013-06-13: 200 mg via INTRAVENOUS
  Filled 2013-06-13: qty 20

## 2013-06-13 MED ORDER — SODIUM CHLORIDE 0.9 % IV SOLN
Freq: Once | INTRAVENOUS | Status: AC
  Administered 2013-06-13: 14:00:00 via INTRAVENOUS

## 2013-06-13 MED ORDER — HEPARIN SOD (PORK) LOCK FLUSH 100 UNIT/ML IV SOLN
500.0000 [IU] | Freq: Once | INTRAVENOUS | Status: AC | PRN
  Administered 2013-06-13: 500 [IU]
  Filled 2013-06-13: qty 5

## 2013-06-13 MED ORDER — SODIUM CHLORIDE 0.9 % IJ SOLN
10.0000 mL | INTRAMUSCULAR | Status: DC | PRN
  Administered 2013-06-13: 10 mL
  Filled 2013-06-13: qty 10

## 2013-06-13 NOTE — Patient Instructions (Signed)
[facility] Discharge Instructions for Patients Receiving Chemotherapy  Today you received the following chemotherapy agents Opdivo  To help prevent nausea and vomiting after your treatment, we encourage you to take your nausea medication    If you develop nausea and vomiting that is not controlled by your nausea medication, call the clinic.   BELOW ARE SYMPTOMS THAT SHOULD BE REPORTED IMMEDIATELY:  *FEVER GREATER THAN 100.5 F  *CHILLS WITH OR WITHOUT FEVER  NAUSEA AND VOMITING THAT IS NOT CONTROLLED WITH YOUR NAUSEA MEDICATION  *UNUSUAL SHORTNESS OF BREATH  *UNUSUAL BRUISING OR BLEEDING  TENDERNESS IN MOUTH AND THROAT WITH OR WITHOUT PRESENCE OF ULCERS  *URINARY PROBLEMS  *BOWEL PROBLEMS  UNUSUAL RASH Items with * indicate a potential emergency and should be followed up as soon as possible.  Feel free to call the clinic you have any questions or concerns. The clinic phone number is [phone].

## 2013-06-16 ENCOUNTER — Encounter: Payer: Self-pay | Admitting: Radiation Oncology

## 2013-06-16 ENCOUNTER — Ambulatory Visit
Admission: RE | Admit: 2013-06-16 | Discharge: 2013-06-16 | Disposition: A | Discharge status: Home / Self Care | Payer: BC Managed Care – PPO | Source: Ambulatory Visit | Attending: Radiation Oncology | Admitting: Radiation Oncology

## 2013-06-16 ENCOUNTER — Ambulatory Visit
Admission: RE | Admit: 2013-06-16 | Discharge: 2013-06-16 | Disposition: A | Discharge status: Home / Self Care | Payer: BC Managed Care – PPO | Source: Ambulatory Visit | Attending: Internal Medicine | Admitting: Internal Medicine

## 2013-06-16 VITALS — BP 125/84 | HR 116 | Temp 99.0°F | Resp 20 | Wt 144.6 lb

## 2013-06-16 DIAGNOSIS — C7949 Secondary malignant neoplasm of other parts of nervous system: Principal | ICD-10-CM

## 2013-06-16 DIAGNOSIS — C7931 Secondary malignant neoplasm of brain: Secondary | ICD-10-CM

## 2013-06-16 DIAGNOSIS — R531 Weakness: Secondary | ICD-10-CM

## 2013-06-16 DIAGNOSIS — R5381 Other malaise: Secondary | ICD-10-CM

## 2013-06-16 DIAGNOSIS — Z515 Encounter for palliative care: Secondary | ICD-10-CM

## 2013-06-16 DIAGNOSIS — R5383 Other fatigue: Secondary | ICD-10-CM

## 2013-06-16 NOTE — Progress Notes (Signed)
Patient sent to nursing  For whole brain, 1st tx given,  No c/o pain, nausea, vision changes, no dizzy ness stated, does use walker, wearing wig, will do patient education tomorrow, very fatigued, poor appetite, drinking protein drinks, boost,atkins, eating strawberries, yogurt, sae Dr.Ennever 06/12/13 CT done  11:44 AM

## 2013-06-16 NOTE — Consult Note (Signed)
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 Presently under radiotherapy, will continue with chemotherapy under the recommendations of Dr Marin Olp.  Patient and her husband remain hopeful for response to treatment and increase in quantity/quality of life     This NP Wadie Lessen reviewed medical records, received report from team, then meet with the patient and her husband/main support person, Makalah Asberry, introducing them both to the concept of Palliative Medicine.    A discussion was had today regarding advanced directives.   Values and goals of care important to patient and family were attempted to be elicited.  Concept of  Palliative Medicine was discussed, questions and concerns addressed.  Family encouraged to call with questions or concerns.  PMT will continue to support holistically.    Curriculum Parameters Description of the Parameter Patient's Response  C all it what it is  Please give me a summary of what the doctors have told you about your illness. Try to call it by name, and summarize what you heard the doctor say about where are you going next. Example: I have cancer XYZ. It is located XYZ . We are going to start chemo/radiation on XYZ...    "I have cancer"  O ptimize and Organize List your current symptoms i.e pain, constipation, depression, anger, nausea, insomnia, vomiting diarrhea. Supportive and palliative- care is all about optimizing your symptom management.    Start to organize your thoughts, your calendar, your important documents. Who would you want to help you with this  journey? Who would you trust to make decisions for you if you could not make them yourself (your surrogate)?  Fatigue  Poor appetite     Is still working full time and it is "getting more difficult"   Husband is main support and surrogate documented in [place] Start working on your advanced directives, your living will and your code status with the help of the social worker and your oncologist. It is never to early to have these in order so you can enjoy each moment.   Consider working on your future goals for if your treatments are not going the way you want them to. Take charge with the help and guidance from your oncologist!     What else do you want to plan? A birthday, anniversary, a trip? Strike while the iron is hot. Set one goal each day.  AD documented, encouraged to bring on next visit to have scanned into EMR  E nergize What energizes and gets you up in the morning?   How is your family supporting you? How are they holding up? Would it be helpful to talk with our social worker about things related to your care and care givers?   What hobbies do you have and how can we creatively adjust them to meet your physical needs at this time? Continues to work full time, "likes to work"   Encouraged support for husband        Goals of Care:   1.  Code Status: DNR/DNI as documented in their personal records  2. Scope of Treatment:  Hopeful for all available and offered medcial interventions to prolong life.    3. Symptom Management:   1.  Fatigue: Discussed importance of hydration, regular rests periods, adapting work/day schedule  2. Poor appetite:/Dysgeusia: "everything taste like sawdust"    -reiterated importance of hydration    -recommended hard candy to moisten buccal membranes    -recommend soft/pureed easy to swallow foods   4. Psychosocial:  Emotional support offered to both patient and her husband  Both are able to verbalize "how difficult" the  past two years have been  5. Spiritual: Their faith and church support, [employer], have been extremely supportive.    Brief HPI: Per Dr Vernona Rieger note:  Ms. [name] is 58 year old white female with metastatic adenocarcinoma of the lung.Of note, the tumor is EGFR positive. She has already been on TKI therapy with  very limited response. We've had her on first-line chemotherapy. She responded nicely but then progressed.  She is now having radiotherapy for metastic brain lesions  MRI-      05-16-13   IMPRESSION:  1. Eleven new brain metastases, all 6 mm or smaller. Two of these  involve the brainstem. All lesions annotated on series 10.  2. Ten other brain metastases are stable or decreased since  December.      ROS: fatigue, weakness, poor appetite, weight loss   PMH:  Past Medical History  Diagnosis Date  . Pulmonary nodules 06/22/11    per CT scan  . IBS (irritable bowel syndrome)   . Fibroadenoma of breast     hx of  . Hx of radiation therapy 07/05/11 -07/21/11    LS, Tspine  . Hx of radiation therapy  completed7/3/13    brain, SRS  . Headache(784.0)   . Compression fracture     Multiple - Kyphoplasty  . Status post chemotherapy planned start 07/24/12    Alimta/carboplatin/Avastin / Prior chemotherapy Tarceva and Afatinib ,  . S/P radiation therapy 07/19/11     stereotactic radiosurgery to the right frontal, right thalamic, left frontal and left occipital target, 20 Gray in 1 fraction to each target  . Brain lesion 06/2712    New Lesions -3 mm, left mid cerebellum, 5 mm, left medial temporal lobe, 4 mm, right superior cerebellum, 3 mm, right anterior temporal lobe, 2 mm, left parietal cortex   . S/P radiation therapy  07/26/2012     20 Gy to Left temporal, Left cerebellar,Left posterior superior frontal, Right anterior temporal, Right superior vermian, Left occipital       . Hx of radiation therapy 10/21/12    SRS brain  . Shingles 01/02/13  . Postmenopausal HRT (hormone replacement  therapy)     10/18/2005 - 12/2011  . Metastasis of unknown origin   . Adenocarcinoma of lung Dx'd 06/2011  . 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 Breast cancer 01/04/12  . Metastatic lung cancer     Epidermal Growth Factor Positive - Afatinib  . Breast cancer 01/29/2013  . S/P radiation therapy  12/30/2012    Right Frontal and Right Temoporal lobes  . S/P radiation therapy 04/29/2013-05/14/2013    Sacral Lesion     PSH: Past Surgical History  Procedure Laterality Date  . Lung biopsy    . Vertebroplasty    . Vertebroplasty    . Breast biopsy  2013    right breast  . Intrauterine device insertion  10/2005  Mirena was removed 01/24/11   I have reviewed the FH and SH and  If appropriate update it with new information. No Known Allergies Scheduled Meds: Continuous Infusions: PRN Meds:.      LMP 01/17/2004   PPS: 70%  No intake or output data in the 24 hours ending 06/16/13 1223  Physical Exam:  General: chronically ill appearing HEENT:  Dry mucous membranes, no exudate Skin: warm and dry Ext: without edema Neuro: alert and oriented X3  Labs: CBC    Component Value Date/Time   WBC 13.9* 06/12/2013 0758   WBC 9.0 07/02/2012 0805   RBC 2.71* 06/12/2013 0758   RBC 2.47* 03/19/2013 0750   RBC 4.24 07/02/2012 0805   HGB 8.9* 06/12/2013 0758   HGB 11.5* 07/02/2012 0805   HCT 27.4* 06/12/2013 0758   HCT 34.9* 07/02/2012 0805   PLT 334 06/12/2013 0758   PLT 551* 07/02/2012 0805   MCV 101 06/12/2013 0758   MCV 82.3 07/02/2012 0805   MCH 32.8 06/12/2013 0758   MCH 27.1 07/02/2012 0805   MCHC 32.5 06/12/2013 0758   MCHC 33.0 07/02/2012 0805   RDW 17.5* 06/12/2013 0758   RDW 14.3 07/02/2012 0805   LYMPHSABS 0.4* 06/12/2013 0758   LYMPHSABS 0.7 12/13/2011 0755   MONOABS 0.6 12/13/2011 0755   EOSABS 0.0 06/12/2013 0758   EOSABS 0.4 12/13/2011 0755   BASOSABS 0.1 06/12/2013 0758   BASOSABS 0.0 12/13/2011 0755    BMET     Component Value Date/Time   NA 134 06/12/2013 0915   NA 143 08/14/2012 0907   K 3.8 06/12/2013 0915   K 3.9 08/14/2012 0907   CL 98 06/12/2013 0915   CL 106 08/14/2012 0907   CO2 24 06/12/2013 0915   CO2 25 08/14/2012 0907   GLUCOSE 97 06/12/2013 0915   GLUCOSE 111* 08/14/2012 0907   BUN 17 06/12/2013 0915   BUN 28.1* 05/02/2013 1227   BUN 10 08/14/2012 0907   CREATININE 1.6* 06/12/2013 0915   CREATININE 1.5* 05/02/2013 1227   CREATININE 0.71 08/14/2012 0907   CALCIUM 8.5 06/12/2013 0915   CALCIUM 9.0 08/14/2012 0907   GFRNONAA >90 12/13/2011 0755   GFRAA >90 12/13/2011 0755    CMP     Component Value Date/Time   NA 134 06/12/2013 0915   NA 143 08/14/2012 0907   K 3.8 06/12/2013 0915   K 3.9 08/14/2012 0907   CL 98 06/12/2013 0915   CL 106 08/14/2012 0907   CO2 24 06/12/2013 0915   CO2 25 08/14/2012 0907   GLUCOSE 97 06/12/2013 0915   GLUCOSE 111* 08/14/2012 0907   BUN 17 06/12/2013 0915   BUN 28.1* 05/02/2013 1227   BUN 10 08/14/2012 0907   CREATININE 1.6* 06/12/2013 0915   CREATININE 1.5* 05/02/2013 1227   CREATININE 0.71 08/14/2012 0907   CALCIUM 8.5 06/12/2013 0915   CALCIUM 9.0 08/14/2012 0907   PROT 6.4 06/12/2013 0915   PROT 6.3 08/14/2012 0907   ALBUMIN 3.8 08/14/2012 0907   AST 33 06/12/2013 0915   AST 31 08/14/2012 0907   ALT 26 06/12/2013 0915   ALT 37* 08/14/2012 0907   ALKPHOS 127* 06/12/2013 0915   ALKPHOS 270* 08/14/2012 0907   BILITOT 0.60 06/12/2013 0915   BILITOT 0.2* 08/14/2012 0907   GFRNONAA >90 12/13/2011 0755   GFRAA >90 12/13/2011 0755   ECOG PERFORMANCE STATUS* (Eastern Cooperative Oncology Group)  0 Fully active, able to continue with all pre-disease activities without restriction. Pt score  1 Restricted in physically strenuous activity but ambulatory and able to carry out work of a light or sedentary nature, e.g., light house work, office work. 1  2 Ambulatory and capable of all self-care but unable to carry out any work activities. Up and about more than 50% of waking  hours.    3 Capable of only limited self-care. Confined to bed or chair more than 50% of waking hours.   4 Completely disabled. Cannot carry on any self-care. Totally confined to bed or chair.   5 Dead.    As published in Am. J. Clin. Oncol.: Eustace Pen, M.M., Colon Flattery., [place], D.C., [place]orton, Sharen Hint., Drexel Iha, P.P.: Toxicity And Response Criteria Of The [facility] Group. [place] 9:373-428, 1982.  The ECOG Performance Status is in the public domain therefore available for public use. To duplicate the scale, please cite the reference above and credit the [facility] Group, Tyler Pita M.D., Group Chair   Time In Time Out Total Time Spent with Patient Total Overall Time  1030 1130 60 min 60 min    Greater than 50%  of this time was spent counseling and coordinating care related to the above assessment and plan.   Wadie Lessen NP  Palliative Medicine Team Team Phone # [phone] Pager [phone]  Discussed with Dr Isidore Moos

## 2013-06-16 NOTE — Progress Notes (Signed)
   Weekly Management Note:  outpatient Current Dose:  2.5 Gy  Projected Dose: 35 Gy   Narrative:  The patient presents for routine under treatment assessment.  CBCT/MVCT images/Port film x-rays were reviewed.  The chart was checked. Feels very tired. Received a blood transfusion last week. CT images show systemic progression, therefore, as I have discussed with Dr Marin Olp, she is starting PD-1 inhibitor, Nivolumab   Physical Findings:  weight is 144 lb 9.6 oz (65.59 kg). Her oral temperature is 99 F (37.2 C). Her blood pressure is 125/84 and her pulse is 116. Her respiration is 20 and oxygen saturation is 97%.  using a walker  Impression:  The patient is tolerating radiotherapy.  Plan:  Continue radiotherapy as planned.  She knows WBRT will make her feel more tired, most likely.  She will meet with Wadie Lessen from palliative care today to discuss her goals of care.   ________________________________   Eppie Gibson, M.D.

## 2013-06-16 NOTE — Progress Notes (Signed)
Simulation Verification Note Outpatient Brain metastases  The patient was brought to the treatment unit and placed in the planned treatment position. The clinical setup was verified. Then port films were obtained and uploaded to the radiation oncology medical record software.  The treatment beams were carefully compared against the planned radiation fields. The position location and shape of the radiation fields was reviewed. They targeted volume of tissue appears to be appropriately covered by the radiation beams. Organs at risk appear to be excluded as planned.  Based on my personal review, I approved the simulation verification. The patient's treatment will proceed as planned.  -----------------------------------  Eppie Gibson, MD

## 2013-06-17 ENCOUNTER — Ambulatory Visit
Admission: RE | Admit: 2013-06-17 | Discharge: 2013-06-17 | Disposition: A | Discharge status: Home / Self Care | Payer: BC Managed Care – PPO | Source: Ambulatory Visit | Attending: Radiation Oncology | Admitting: Radiation Oncology

## 2013-06-18 ENCOUNTER — Ambulatory Visit
Admission: RE | Admit: 2013-06-18 | Discharge: 2013-06-18 | Disposition: A | Discharge status: Home / Self Care | Payer: BC Managed Care – PPO | Source: Ambulatory Visit | Attending: Radiation Oncology | Admitting: Radiation Oncology

## 2013-06-19 ENCOUNTER — Other Ambulatory Visit: Payer: BC Managed Care – PPO | Admitting: Lab

## 2013-06-19 ENCOUNTER — Ambulatory Visit: Payer: BC Managed Care – PPO | Admitting: Hematology & Oncology

## 2013-06-19 ENCOUNTER — Ambulatory Visit
Admission: RE | Admit: 2013-06-19 | Discharge: 2013-06-19 | Disposition: A | Discharge status: Home / Self Care | Payer: BC Managed Care – PPO | Source: Ambulatory Visit | Attending: Radiation Oncology | Admitting: Radiation Oncology

## 2013-06-19 ENCOUNTER — Ambulatory Visit: Payer: BC Managed Care – PPO

## 2013-06-20 ENCOUNTER — Ambulatory Visit
Admission: RE | Admit: 2013-06-20 | Discharge: 2013-06-20 | Disposition: A | Discharge status: Home / Self Care | Payer: BC Managed Care – PPO | Source: Ambulatory Visit | Attending: Radiation Oncology | Admitting: Radiation Oncology

## 2013-06-20 ENCOUNTER — Ambulatory Visit: Payer: BC Managed Care – PPO

## 2013-06-23 ENCOUNTER — Ambulatory Visit
Admission: RE | Admit: 2013-06-23 | Discharge: 2013-06-23 | Disposition: A | Discharge status: Home / Self Care | Payer: BC Managed Care – PPO | Source: Ambulatory Visit | Attending: Radiation Oncology | Admitting: Radiation Oncology

## 2013-06-23 ENCOUNTER — Encounter: Payer: Self-pay | Admitting: Radiation Oncology

## 2013-06-23 VITALS — BP 129/85 | HR 113 | Resp 16 | Wt 141.7 lb

## 2013-06-23 DIAGNOSIS — C7931 Secondary malignant neoplasm of brain: Secondary | ICD-10-CM

## 2013-06-23 DIAGNOSIS — C7949 Secondary malignant neoplasm of other parts of nervous system: Secondary | ICD-10-CM

## 2013-06-23 DIAGNOSIS — C3490 Malignant neoplasm of unspecified part of unspecified bronchus or lung: Secondary | ICD-10-CM

## 2013-06-23 MED ORDER — ONDANSETRON HCL 8 MG PO TABS: 8.0000 mg | ORAL_TABLET | Freq: Three times a day (TID) | ORAL | Status: AC | PRN

## 2013-06-23 NOTE — Progress Notes (Signed)
3 lb weight loss noted since last PUT. Reports eating fruit, yogurt and boost. Vitals stable. Reports decreased appetite related to taste changes and low tolerance for certain textures. Reports she vomited today but, took compazine at 1300 and hasn't since. Denies headache, dizziness, diplopia or ringing in the ears. Reports weepy eyes related to allergies for which she take claritin when she can remember. Reports fatigue.

## 2013-06-23 NOTE — Progress Notes (Signed)
   Weekly Management Note:  outpatient Current Dose:  15 Gy  Projected Dose: 35 Gy  Whole brain  Narrative:  The patient presents for routine under treatment assessment.  CBCT/MVCT images/Port film x-rays were reviewed.  The chart was checked. Mai complaints are fatigue and nausea. Compazine taken today, but she vomited.  Physical Findings:  weight is 141 lb 11.2 oz (64.275 kg). Her blood pressure is 129/85 and her pulse is 113. Her respiration is 16.  Tired appearing - in wheelchair - no thrush.  Impression:  The patient is tolerating radiotherapy.  Plan:  Continue radiotherapy as planned. Zofran Rx given - this may be more effective for nausea with less drowsiness.  ________________________________   Eppie Gibson, M.D.

## 2013-06-24 ENCOUNTER — Telehealth: Payer: Self-pay | Admitting: Radiation Oncology

## 2013-06-24 ENCOUNTER — Ambulatory Visit
Admission: RE | Admit: 2013-06-24 | Discharge: 2013-06-24 | Disposition: A | Discharge status: Home / Self Care | Payer: BC Managed Care – PPO | Source: Ambulatory Visit | Attending: Radiation Oncology | Admitting: Radiation Oncology

## 2013-06-24 NOTE — Telephone Encounter (Signed)
Left message for dietician requesting they reach out to patient. Explained the patient is experiencing taste changes and eating very little as a result. Explained that the patient's weight was 148.4 lb on 5/12, 144.9 lb on 6/1, and 141 lb on 6/8. Expressed that patient verbalized she is only eating yogurt, fruit and boost. Patient requested that a nutritionist phone her with additional ideas for things to eat with possible recipes.

## 2013-06-25 ENCOUNTER — Telehealth: Payer: Self-pay | Admitting: *Deleted

## 2013-06-25 ENCOUNTER — Ambulatory Visit
Admission: RE | Admit: 2013-06-25 | Discharge: 2013-06-25 | Disposition: A | Discharge status: Home / Self Care | Payer: BC Managed Care – PPO | Source: Ambulatory Visit | Attending: Radiation Oncology | Admitting: Radiation Oncology

## 2013-06-25 NOTE — Telephone Encounter (Signed)
Received a message from [facility] regarding pt.  Pt has lost approximately 7# in 1 month and is only drinking Boost and eating mostly fruit.  Pt would like a phone call for ideas to increase po intake  Called pt and discussed nutritional issues.  Pt c/o changes in taste, problems tolerating textures, and loss of appetite. Drinking 1 Boost per day and some fruits.  Wt 141# (06/24/13)  down from 148# (05/27/13).  Recommend pt increase Boost as tolerated to 2-3/day.  Encouraged increased kcal and protein, made suggestions including ice cream and CIB.  Advised pt to eat every couple of hours to help increase kcal and protein to help prevent wt loss.    Pt agreeable to suggestions.  Will mail recipes for high kcal/high protein smoothies and shakes.  RD phone number provided for questions or support.

## 2013-06-26 ENCOUNTER — Ambulatory Visit
Admission: RE | Admit: 2013-06-26 | Discharge: 2013-06-26 | Disposition: A | Discharge status: Home / Self Care | Payer: BC Managed Care – PPO | Source: Ambulatory Visit | Attending: Radiation Oncology | Admitting: Radiation Oncology

## 2013-06-26 DIAGNOSIS — R531 Weakness: Secondary | ICD-10-CM | POA: Insufficient documentation

## 2013-06-27 ENCOUNTER — Other Ambulatory Visit: Payer: BC Managed Care – PPO | Admitting: Lab

## 2013-06-27 ENCOUNTER — Ambulatory Visit: Payer: BC Managed Care – PPO

## 2013-06-27 ENCOUNTER — Ambulatory Visit
Admission: RE | Admit: 2013-06-27 | Discharge: 2013-06-27 | Disposition: A | Discharge status: Home / Self Care | Payer: BC Managed Care – PPO | Source: Ambulatory Visit | Attending: Radiation Oncology | Admitting: Radiation Oncology

## 2013-06-27 ENCOUNTER — Ambulatory Visit: Payer: BC Managed Care – PPO | Admitting: Hematology & Oncology

## 2013-06-27 NOTE — Consult Note (Signed)
I have reviewed this case with our NP and agree with the Assessment and Plan as stated.  Caelan Atchley L. Shamon Lobo, MD MBA The Palliative Medicine Team at [facility] Team Phone: [phone] Pager: [phone]   

## 2013-06-30 ENCOUNTER — Ambulatory Visit
Admission: RE | Admit: 2013-06-30 | Discharge: 2013-06-30 | Disposition: A | Discharge status: Home / Self Care | Payer: BC Managed Care – PPO | Source: Ambulatory Visit | Attending: Radiation Oncology | Admitting: Radiation Oncology

## 2013-06-30 VITALS — BP 127/85 | HR 120 | Temp 98.6°F | Resp 20 | Ht 62.0 in | Wt 141.3 lb

## 2013-06-30 DIAGNOSIS — C7949 Secondary malignant neoplasm of other parts of nervous system: Principal | ICD-10-CM

## 2013-06-30 DIAGNOSIS — C7931 Secondary malignant neoplasm of brain: Secondary | ICD-10-CM

## 2013-06-30 NOTE — Progress Notes (Signed)
[name] has had 11 fractions to her whole brain. She is reporting pain in her left hip that she is rating at a 5/10.  She said it started hurting about 2 weeks ago.  She took a hydrocodone/acetaminophen for the pain today.  She said it hurts when she gets off the table for treatment and in the mornings.  She reports problems with her balance.  She is in a wheelchair today.  She denies vision changes.  She reports nausea and a poor appetite.  Her weight is stable from last week.  She reports dry skin on her face.  She reports fatigue.  She was given the radiation therapy and you book to review.  Patient was also given biafine cream and was instructed to apply it twice a day after treatment and at bedtime.

## 2013-06-30 NOTE — Progress Notes (Signed)
Weekly Management Note:  outpatient Current Dose:  27.5 Gy  Projected Dose: 35 Gy  Whole BrainRT  Narrative:  The patient presents for routine under treatment assessment.  CBCT/MVCT images/Port film x-rays were reviewed.  The chart was checked. She is reporting pain in the L SI region that she is rating at a 5/10. She said it started hurting about 2 weeks ago. She took a hydrocodone/acetaminophen for the pain today. She said it hurts when she gets off the table for treatment and in the mornings. She reports problems with her balance. She is in a wheelchair today. She denies vision changes. She reports nausea and a poor appetite and poor taste.  Taking about 32 ounces water daily and little food. Her weight is stable from last week. She reports dry skin on her face. She reports fatigue. Not on steroids.   Physical Findings:  height is 5\' 2"  (1.575 m) and weight is 141 lb 4.8 oz (64.093 kg). Her oral temperature is 98.6 F (37 C). Her blood pressure is 127/85 and her pulse is 120. Her respiration is 20 and oxygen saturation is 92%.  NAD, in wheelchair, alert and conversant and oriented. Alopecia but minimal scalp erythema. Dry skin on face.  Points to L SI region for site of pain.  CBC    Component Value Date/Time   WBC 13.9* 06/12/2013 0758   WBC 9.0 07/02/2012 0805   RBC 2.71* 06/12/2013 0758   RBC 2.47* 03/19/2013 0750   RBC 4.24 07/02/2012 0805   HGB 8.9* 06/12/2013 0758   HGB 11.5* 07/02/2012 0805   HCT 27.4* 06/12/2013 0758   HCT 34.9* 07/02/2012 0805   PLT 334 06/12/2013 0758   PLT 551* 07/02/2012 0805   MCV 101 06/12/2013 0758   MCV 82.3 07/02/2012 0805   MCH 32.8 06/12/2013 0758   MCH 27.1 07/02/2012 0805   MCHC 32.5 06/12/2013 0758   MCHC 33.0 07/02/2012 0805   RDW 17.5* 06/12/2013 0758   RDW 14.3 07/02/2012 0805   LYMPHSABS 0.4* 06/12/2013 0758   LYMPHSABS 0.7 12/13/2011 0755   MONOABS 0.6 12/13/2011 0755   EOSABS 0.0 06/12/2013 0758   EOSABS 0.4 12/13/2011 0755   BASOSABS 0.1 06/12/2013  0758   BASOSABS 0.0 12/13/2011 0755     CMP     Component Value Date/Time   NA 134 06/12/2013 0915   NA 143 08/14/2012 0907   K 3.8 06/12/2013 0915   K 3.9 08/14/2012 0907   CL 98 06/12/2013 0915   CL 106 08/14/2012 0907   CO2 24 06/12/2013 0915   CO2 25 08/14/2012 0907   GLUCOSE 97 06/12/2013 0915   GLUCOSE 111* 08/14/2012 0907   BUN 17 06/12/2013 0915   BUN 28.1* 05/02/2013 1227   BUN 10 08/14/2012 0907   CREATININE 1.6* 06/12/2013 0915   CREATININE 1.5* 05/02/2013 1227   CREATININE 0.71 08/14/2012 0907   CALCIUM 8.5 06/12/2013 0915   CALCIUM 9.0 08/14/2012 0907   PROT 6.4 06/12/2013 0915   PROT 6.3 08/14/2012 0907   ALBUMIN 3.8 08/14/2012 0907   AST 33 06/12/2013 0915   AST 31 08/14/2012 0907   ALT 26 06/12/2013 0915   ALT 37* 08/14/2012 0907   ALKPHOS 127* 06/12/2013 0915   ALKPHOS 270* 08/14/2012 0907   BILITOT 0.60 06/12/2013 0915   BILITOT 0.2* 08/14/2012 0907   GFRNONAA >90 12/13/2011 0755   GFRAA >90 12/13/2011 0755     Impression:  The patient is tolerating radiotherapy.  Tachycardia is subacute,  recurrent, may be due to pain or decreased PO intake..  Plan:  Continue radiotherapy as planned. Will order 1 mo f/u at completion of RT, sooner if needed.  Will ask our pt navigator to see if an appt can be made this week for her to see Dr Marin Olp with basic labs and possible IV fluids (pt reports missing last week's appt).    Push PO fluids - we discussed ways to do this. Reinforced nutritionist advice.  I reviewed her imaging and there is not an obvious bony source for her left sacral / SI joint pain. He pain is not in the hip socket or ischium (where the disease is concentrated per the L pelvis).  However, her cancer may still be responsible for her sx.  She will let me know if the sx worsen.  She is understandably not enthusiastic about considering yet another 2 week course of RT.  If she eventually needs more treatment for bone disease, I would try to hypofractionate it given her declining  performance status. -----------------------------------  Eppie Gibson, MD

## 2013-07-01 ENCOUNTER — Ambulatory Visit (HOSPITAL_BASED_OUTPATIENT_CLINIC_OR_DEPARTMENT_OTHER): Payer: BC Managed Care – PPO

## 2013-07-01 ENCOUNTER — Other Ambulatory Visit: Payer: Self-pay | Admitting: Nurse Practitioner

## 2013-07-01 ENCOUNTER — Ambulatory Visit
Admission: RE | Admit: 2013-07-01 | Discharge: 2013-07-01 | Disposition: A | Discharge status: Home / Self Care | Payer: BC Managed Care – PPO | Source: Ambulatory Visit | Attending: Radiation Oncology | Admitting: Radiation Oncology

## 2013-07-01 ENCOUNTER — Other Ambulatory Visit (HOSPITAL_BASED_OUTPATIENT_CLINIC_OR_DEPARTMENT_OTHER): Payer: BC Managed Care – PPO

## 2013-07-01 VITALS — BP 131/80 | HR 113 | Temp 98.1°F | Resp 20

## 2013-07-01 DIAGNOSIS — C3490 Malignant neoplasm of unspecified part of unspecified bronchus or lung: Secondary | ICD-10-CM

## 2013-07-01 DIAGNOSIS — C50919 Malignant neoplasm of unspecified site of unspecified female breast: Secondary | ICD-10-CM

## 2013-07-01 DIAGNOSIS — C3430 Malignant neoplasm of lower lobe, unspecified bronchus or lung: Secondary | ICD-10-CM

## 2013-07-01 LAB — CBC WITH DIFFERENTIAL/PLATELET
BASO%: 0.8 % (ref 0.0–2.0)
Basophils Absolute: 0.1 10*3/uL (ref 0.0–0.1)
EOS%: 1 % (ref 0.0–7.0)
Eosinophils Absolute: 0.1 10*3/uL (ref 0.0–0.5)
HCT: 34.1 % — ABNORMAL LOW (ref 34.8–46.6)
HGB: 11 g/dL — ABNORMAL LOW (ref 11.6–15.9)
LYMPH%: 3.9 % — ABNORMAL LOW (ref 14.0–49.7)
MCH: 30.5 pg (ref 25.1–34.0)
MCHC: 32.4 g/dL (ref 31.5–36.0)
MCV: 94 fL (ref 79.5–101.0)
MONO#: 1.2 10*3/uL — AB (ref 0.1–0.9)
MONO%: 12.4 % (ref 0.0–14.0)
NEUT%: 81.9 % — ABNORMAL HIGH (ref 38.4–76.8)
NEUTROS ABS: 7.6 10*3/uL — AB (ref 1.5–6.5)
Platelets: 466 10*3/uL — ABNORMAL HIGH (ref 145–400)
RBC: 3.62 10*6/uL — AB (ref 3.70–5.45)
RDW: 18.3 % — ABNORMAL HIGH (ref 11.2–14.5)
WBC: 9.3 10*3/uL (ref 3.9–10.3)
lymph#: 0.4 10*3/uL — ABNORMAL LOW (ref 0.9–3.3)

## 2013-07-01 LAB — COMPREHENSIVE METABOLIC PANEL (CC13)
ALT: 10 U/L (ref 0–55)
ANION GAP: 13 meq/L — AB (ref 3–11)
AST: 24 U/L (ref 5–34)
Albumin: 1.9 g/dL — ABNORMAL LOW (ref 3.5–5.0)
Alkaline Phosphatase: 122 U/L (ref 40–150)
BUN: 26.2 mg/dL — ABNORMAL HIGH (ref 7.0–26.0)
CHLORIDE: 97 meq/L — AB (ref 98–109)
CO2: 24 mEq/L (ref 22–29)
Calcium: 9.5 mg/dL (ref 8.4–10.4)
Creatinine: 2.3 mg/dL — ABNORMAL HIGH (ref 0.6–1.1)
Glucose: 116 mg/dl (ref 70–140)
Potassium: 5 mEq/L (ref 3.5–5.1)
SODIUM: 135 meq/L — AB (ref 136–145)
Total Bilirubin: 0.28 mg/dL (ref 0.20–1.20)
Total Protein: 7.2 g/dL (ref 6.4–8.3)

## 2013-07-01 MED ORDER — BIAFINE EX EMUL
Freq: Two times a day (BID) | CUTANEOUS | Status: AC
  Administered 2013-07-01: 12:00:00 via TOPICAL

## 2013-07-01 MED ORDER — HEPARIN SOD (PORK) LOCK FLUSH 100 UNIT/ML IV SOLN
500.0000 [IU] | Freq: Once | INTRAVENOUS | Status: AC
  Administered 2013-07-01: 500 [IU] via INTRAVENOUS
  Filled 2013-07-01: qty 5

## 2013-07-01 MED ORDER — SODIUM CHLORIDE 0.9 % IV SOLN
1000.0000 mL | Freq: Once | INTRAVENOUS | Status: AC
Start: 2013-07-01 — End: 2013-07-01
  Administered 2013-07-01: 1000 mL via INTRAVENOUS

## 2013-07-01 MED ORDER — SODIUM CHLORIDE 0.9 % IJ SOLN
10.0000 mL | INTRAMUSCULAR | Status: DC | PRN
  Administered 2013-07-01: 10 mL via INTRAVENOUS
  Filled 2013-07-01: qty 10

## 2013-07-01 NOTE — Progress Notes (Signed)
Received a call from Dr. Pearlie Oyster nurse stating pt was having some decreased in po intake. Spoke with Dr. Marin Olp and patient has been set-up to receive labwork and IV hydration prior to her radiation therapy appointment today. I called and spoke with pt and she did stated she was having some weakness, and decrease in her appetite. She states she is having a harder time pushing fluids vs eating. She verbalized understanding of her appointments today. She has also been added to our schedule tomorrow for additional IV fluids and to see Dr. Marin Olp.

## 2013-07-01 NOTE — Patient Instructions (Signed)
Dehydration, Adult Dehydration is when you lose more fluids from the body than you take in. Vital organs like the kidneys, brain, and heart cannot function without a proper amount of fluids and salt. Any loss of fluids from the body can cause dehydration.  CAUSES   Vomiting.  Diarrhea.  Excessive sweating.  Excessive urine output.  Fever. SYMPTOMS  Mild dehydration  Thirst.  Dry lips.  Slightly dry mouth. Moderate dehydration  Very dry mouth.  Sunken eyes.  Skin does not bounce back quickly when lightly pinched and released.  Dark urine and decreased urine production.  Decreased tear production.  Headache. Severe dehydration  Very dry mouth.  Extreme thirst.  Rapid, weak pulse (more than 100 beats per minute at rest).  Cold hands and feet.  Not able to sweat in spite of heat and temperature.  Rapid breathing.  Blue lips.  Confusion and lethargy.  Difficulty being awakened.  Minimal urine production.  No tears. DIAGNOSIS  Your caregiver will diagnose dehydration based on your symptoms and your exam. Blood and urine tests will help confirm the diagnosis. The diagnostic evaluation should also identify the cause of dehydration. TREATMENT  Treatment of mild or moderate dehydration can often be done at home by increasing the amount of fluids that you drink. It is best to drink small amounts of fluid more often. Drinking too much at one time can make vomiting worse. Refer to the home care instructions below. Severe dehydration needs to be treated at the hospital where you will probably be given intravenous (IV) fluids that contain water and electrolytes. HOME CARE INSTRUCTIONS   Ask your caregiver about specific rehydration instructions.  Drink enough fluids to keep your urine clear or pale yellow.  Drink small amounts frequently if you have nausea and vomiting.  Eat as you normally do.  Avoid:  Foods or drinks high in sugar.  Carbonated  drinks.  Juice.  Extremely hot or cold fluids.  Drinks with caffeine.  Fatty, greasy foods.  Alcohol.  Tobacco.  Overeating.  Gelatin desserts.  Wash your hands well to avoid spreading bacteria and viruses.  Only take over-the-counter or prescription medicines for pain, discomfort, or fever as directed by your caregiver.  Ask your caregiver if you should continue all prescribed and over-the-counter medicines.  Keep all follow-up appointments with your caregiver. SEEK MEDICAL CARE IF:  You have abdominal pain and it increases or stays in one area (localizes).  You have a rash, stiff neck, or severe headache.  You are irritable, sleepy, or difficult to awaken.  You are weak, dizzy, or extremely thirsty. SEEK IMMEDIATE MEDICAL CARE IF:   You are unable to keep fluids down or you get worse despite treatment.  You have frequent episodes of vomiting or diarrhea.  You have blood or green matter (bile) in your vomit.  You have blood in your stool or your stool looks black and tarry.  You have not urinated in 6 to 8 hours, or you have only urinated a small amount of very dark urine.  You have a fever.  You faint. MAKE SURE YOU:   Understand these instructions.  Will watch your condition.  Will get help right away if you are not doing well or get worse. Document Released: 01/02/2005 Document Revised: 03/27/2011 Document Reviewed: 08/22/2010 [employer] Patient Information 2014 [employer].  

## 2013-07-01 NOTE — Addendum Note (Signed)
Encounter addended by: Jacqulyn Liner, RN on: 07/01/2013 11:34 AM<BR>     Documentation filed: Orders

## 2013-07-01 NOTE — Addendum Note (Signed)
Encounter addended by: Jacqulyn Liner, RN on: 07/01/2013 11:38 AM<BR>     Documentation filed: Inpatient MAR

## 2013-07-02 ENCOUNTER — Ambulatory Visit (HOSPITAL_BASED_OUTPATIENT_CLINIC_OR_DEPARTMENT_OTHER): Payer: BC Managed Care – PPO | Admitting: Hematology & Oncology

## 2013-07-02 ENCOUNTER — Ambulatory Visit (HOSPITAL_BASED_OUTPATIENT_CLINIC_OR_DEPARTMENT_OTHER): Payer: BC Managed Care – PPO

## 2013-07-02 ENCOUNTER — Other Ambulatory Visit: Payer: Self-pay

## 2013-07-02 ENCOUNTER — Encounter: Payer: Self-pay | Admitting: Hematology & Oncology

## 2013-07-02 ENCOUNTER — Ambulatory Visit
Admission: RE | Admit: 2013-07-02 | Discharge: 2013-07-02 | Disposition: A | Discharge status: Home / Self Care | Payer: BC Managed Care – PPO | Source: Ambulatory Visit | Attending: Radiation Oncology | Admitting: Radiation Oncology

## 2013-07-02 ENCOUNTER — Other Ambulatory Visit (HOSPITAL_BASED_OUTPATIENT_CLINIC_OR_DEPARTMENT_OTHER): Payer: BC Managed Care – PPO | Admitting: Lab

## 2013-07-02 VITALS — BP 152/83 | HR 130 | Temp 98.5°F | Resp 16 | Ht 61.0 in | Wt 151.0 lb

## 2013-07-02 DIAGNOSIS — C7949 Secondary malignant neoplasm of other parts of nervous system: Secondary | ICD-10-CM

## 2013-07-02 DIAGNOSIS — C3430 Malignant neoplasm of lower lobe, unspecified bronchus or lung: Secondary | ICD-10-CM

## 2013-07-02 DIAGNOSIS — R531 Weakness: Secondary | ICD-10-CM

## 2013-07-02 DIAGNOSIS — C7952 Secondary malignant neoplasm of bone marrow: Secondary | ICD-10-CM

## 2013-07-02 DIAGNOSIS — C50919 Malignant neoplasm of unspecified site of unspecified female breast: Secondary | ICD-10-CM

## 2013-07-02 DIAGNOSIS — C7931 Secondary malignant neoplasm of brain: Secondary | ICD-10-CM

## 2013-07-02 DIAGNOSIS — J90 Pleural effusion, not elsewhere classified: Secondary | ICD-10-CM

## 2013-07-02 DIAGNOSIS — C3490 Malignant neoplasm of unspecified part of unspecified bronchus or lung: Secondary | ICD-10-CM

## 2013-07-02 DIAGNOSIS — C7951 Secondary malignant neoplasm of bone: Secondary | ICD-10-CM

## 2013-07-02 LAB — CMP (CANCER CENTER ONLY)
ALT: 14 U/L (ref 10–47)
AST: 27 U/L (ref 11–38)
Albumin: 2.2 g/dL — ABNORMAL LOW (ref 3.3–5.5)
Alkaline Phosphatase: 103 U/L — ABNORMAL HIGH (ref 26–84)
BILIRUBIN TOTAL: 0.5 mg/dL (ref 0.20–1.60)
BUN, Bld: 22 mg/dL (ref 7–22)
CO2: 28 meq/L (ref 18–33)
Calcium: 8.6 mg/dL (ref 8.0–10.3)
Chloride: 100 mEq/L (ref 98–108)
Creat: 1.8 mg/dl — ABNORMAL HIGH (ref 0.6–1.2)
Glucose, Bld: 87 mg/dL (ref 73–118)
Potassium: 4.3 mEq/L (ref 3.3–4.7)
SODIUM: 137 meq/L (ref 128–145)
TOTAL PROTEIN: 6.8 g/dL (ref 6.4–8.1)

## 2013-07-02 LAB — CBC WITH DIFFERENTIAL (CANCER CENTER ONLY)
BASO#: 0 10*3/uL (ref 0.0–0.2)
BASO%: 0.4 % (ref 0.0–2.0)
EOS%: 1.3 % (ref 0.0–7.0)
Eosinophils Absolute: 0.1 10*3/uL (ref 0.0–0.5)
HCT: 32.1 % — ABNORMAL LOW (ref 34.8–46.6)
HGB: 10.2 g/dL — ABNORMAL LOW (ref 11.6–15.9)
LYMPH#: 0.4 10*3/uL — ABNORMAL LOW (ref 0.9–3.3)
LYMPH%: 4.9 % — AB (ref 14.0–48.0)
MCH: 31.1 pg (ref 26.0–34.0)
MCHC: 31.8 g/dL — ABNORMAL LOW (ref 32.0–36.0)
MCV: 98 fL (ref 81–101)
MONO#: 1.3 10*3/uL — AB (ref 0.1–0.9)
MONO%: 15.6 % — ABNORMAL HIGH (ref 0.0–13.0)
NEUT#: 6.5 10*3/uL (ref 1.5–6.5)
NEUT%: 77.8 % (ref 39.6–80.0)
PLATELETS: 412 10*3/uL — AB (ref 145–400)
RBC: 3.28 10*6/uL — ABNORMAL LOW (ref 3.70–5.32)
RDW: 17.4 % — ABNORMAL HIGH (ref 11.1–15.7)
WBC: 8.3 10*3/uL (ref 3.9–10.0)

## 2013-07-02 MED ORDER — DEXAMETHASONE SODIUM PHOSPHATE 10 MG/ML IJ SOLN
30.0000 mg | Freq: Once | INTRAMUSCULAR | Status: AC
  Administered 2013-07-02: 30 mg via INTRAVENOUS

## 2013-07-02 MED ORDER — DRONABINOL 2.5 MG PO CAPS
2.5000 mg | ORAL_CAPSULE | Freq: Two times a day (BID) | ORAL | Status: AC
Start: 2013-07-02 — End: ?

## 2013-07-02 MED ORDER — SODIUM CHLORIDE 0.9 % IV SOLN
Freq: Once | INTRAVENOUS | Status: AC
  Administered 2013-07-02: 14:00:00 via INTRAVENOUS

## 2013-07-02 MED ORDER — METHYLPHENIDATE HCL 10 MG PO TABS: ORAL_TABLET | ORAL | Status: DC

## 2013-07-02 MED ORDER — DEXAMETHASONE SODIUM PHOSPHATE 20 MG/5ML IJ SOLN
INTRAMUSCULAR | Status: AC
  Filled 2013-07-02: qty 5

## 2013-07-02 MED ORDER — HEPARIN SOD (PORK) LOCK FLUSH 100 UNIT/ML IV SOLN
500.0000 [IU] | Freq: Once | INTRAVENOUS | Status: AC
  Administered 2013-07-02: 500 [IU] via INTRAVENOUS
  Filled 2013-07-02: qty 5

## 2013-07-02 MED ORDER — DEXAMETHASONE 4 MG PO TABS: 4.0000 mg | ORAL_TABLET | Freq: Two times a day (BID) | ORAL | Status: AC

## 2013-07-02 MED ORDER — ZOLPIDEM TARTRATE 5 MG PO TABS
5.0000 mg | ORAL_TABLET | Freq: Every evening | ORAL | Status: DC | PRN
Start: 2013-07-02 — End: 2013-07-16

## 2013-07-02 MED ORDER — DEXAMETHASONE SODIUM PHOSPHATE 10 MG/ML IJ SOLN
INTRAMUSCULAR | Status: AC
  Filled 2013-07-02: qty 1

## 2013-07-02 MED ORDER — SODIUM CHLORIDE 0.9 % IJ SOLN
10.0000 mL | INTRAMUSCULAR | Status: AC | PRN
  Administered 2013-07-02: 10 mL via INTRAVENOUS
  Filled 2013-07-02: qty 10

## 2013-07-02 NOTE — Patient Instructions (Signed)

## 2013-07-03 ENCOUNTER — Ambulatory Visit
Admission: RE | Admit: 2013-07-03 | Discharge: 2013-07-03 | Disposition: A | Discharge status: Home / Self Care | Payer: BC Managed Care – PPO | Source: Ambulatory Visit | Attending: Radiation Oncology | Admitting: Radiation Oncology

## 2013-07-03 ENCOUNTER — Encounter: Payer: Self-pay | Admitting: Radiation Oncology

## 2013-07-03 NOTE — Progress Notes (Signed)
Hematology and Oncology Follow Up Visit  [name] [id number] [birth year] 58 y.o. 07/03/2013   Principle Diagnosis:   Metastatic poorly differentiated adenocarcinoma the lung  Current Therapy:    S/p c#1 of Opdivo  Palliative XRT for CNS mets  Zometa 4mg  IV q month     Interim History:  [name] is back for followup. Unfortunately, she is not doing too well. She does is very fatigued and tired. She is having more difficulty. She is still trying to work with shocks me.  She is having radiation therapy for the brain metastases. She has several brain metastases that are being treated.  I think she did well with the chemotherapy that we gave her last time. We started her on Opdivo. She really did not have any nausea vomiting.  Her pain control seems to be doing okay. She still has some constipation. There is somewhat short of breath. A lot of her lung progression is with her pulmonary metastasis.  She's had no bleeding. She has had some leg swelling. She has had no blurred vision. There are no rashes. There been no seizures. Her appetite is marginal.  We will try to increase her dose of Ritalin. We may have to also consider something to try to help with her appetite. I will put her on some Decadron. We will see how that does  She is on Aromasin. I may want to think about stopping this. She does have the localized breast cancer of the right breast. This, in my mind, is just not an issue now.     Medications: Current outpatient prescriptions:ALPRAZolam (XANAX) 0.25 MG tablet, Take 0.25-0.5 mg by mouth as needed. For anxiety., Disp: , Rfl: ;  amLODipine (NORVASC) 2.5 MG tablet, Take 2.5 mg by mouth daily., Disp: , Rfl: ;  exemestane (AROMASIN) 25 MG tablet, TAKE 1 TABLET BY MOUTH IN THE MORNING AFTER BREAKFAST, Disp: 30 tablet, Rfl: 6 HYDROcodone-acetaminophen (NORCO) 10-325 MG per tablet, Take 1 tablet by mouth every 6 (six) hours as needed., Disp: 120 tablet, Rfl: 0;   ibuprofen (ADVIL,MOTRIN) 200 MG tablet, Take 600 mg by mouth every 6 (six) hours as needed for pain. , Disp: , Rfl: ;  lidocaine-prilocaine (EMLA) cream, Apply topically as needed. , Disp: , Rfl: ;  loperamide (IMODIUM) 2 MG capsule, Take 2 mg by mouth 4 (four) times daily as needed. For diarrhea, Disp: , Rfl:  Loratadine (CLARITIN) 10 MG CAPS, Take by mouth as needed. , Disp: , Rfl: ;  LORazepam (ATIVAN) 0.5 MG tablet, 0.5 mg at bedtime. , Disp: , Rfl: ;  methylphenidate (RITALIN) 10 MG tablet, Take 1 pill TWICE a day., Disp: 60 tablet, Rfl: 0;  ondansetron (ZOFRAN) 8 MG tablet, Take 1 tablet (8 mg total) by mouth every 8 (eight) hours as needed for nausea or vomiting., Disp: 20 tablet, Rfl: 5 Probiotic Product (ALIGN) 4 MG CAPS, Take by mouth every morning., Disp: , Rfl: ;  prochlorperazine (COMPAZINE) 10 MG tablet, TAKE 1 TABLET (10 MG TOTAL) BY MOUTH EVERY 6 (SIX) HOURS AS NEEDED FOR NAUSEA OR VOMITING., Disp: 30 tablet, Rfl: 1;  zolendronic acid (ZOMETA) 4 MG/5ML injection, Inject 4 mg into the vein every 30 (thirty) days., Disp: , Rfl:  zolpidem (AMBIEN) 5 MG tablet, Take 1 tablet (5 mg total) by mouth at bedtime as needed for sleep., Disp: 30 tablet, Rfl: 2;  dexamethasone (DECADRON) 4 MG tablet, Take 1 tablet (4 mg total) by mouth 2 (two) times daily with a meal.,  Disp: 60 tablet, Rfl: 2;  dronabinol (MARINOL) 2.5 MG capsule, Take 1 capsule (2.5 mg total) by mouth 2 (two) times daily before a meal., Disp: 60 capsule, Rfl: 0 emollient (BIAFINE) cream, Apply topically as needed., Disp: , Rfl: ;  naphazoline (NAPHCON) 0.1 % ophthalmic solution, Place 1 drop into both eyes 4 (four) times daily as needed., Disp: 15 mL, Rfl: 2 No current facility-administered medications for this visit. Facility-Administered Medications Ordered in Other Visits: sodium chloride 0.9 % injection 10 mL, 10 mL, Intracatheter, PRN, Volanda Napoleon, MD, 10 mL at 09/04/12 1511;  sodium chloride 0.9 % injection 10 mL, 10 mL,  Intravenous, PRN, Volanda Napoleon, MD, 10 mL at 07/02/13 1529;  topical emolient (BIAFINE) emulsion, , Topical, BID, Eppie Gibson, MD  Allergies: No Known Allergies  Past Medical History, Surgical history, Social history, and Family History were reviewed and updated.  Review of Systems: As above  Physical Exam:  height is 5\' 1"  (1.549 m) and weight is 151 lb (68.493 kg). Her oral temperature is 98.5 F (36.9 C). Her blood pressure is 152/83 and her pulse is 130. Her respiration is 16.   Chronically ill-appearing white female. Her head exam shows no ocular or oral lesions. There are no palpable cervical or supraclavicular lymph nodes. Lungs are with decreased breath sounds on the left side. Right side shows better breath sounds. Cardiac exam tachycardic irregular. Abdomen is soft. Has good bowel sounds. No palpable liver or spleen. Extremities shows some trace edema. Skin exam no rashes. Neurological exam is nonfocal. Lab Results  Component Value Date   WBC 8.3 07/02/2013   HGB 10.2* 07/02/2013   HCT 32.1* 07/02/2013   MCV 98 07/02/2013   PLT 412* 07/02/2013     Chemistry      Component Value Date/Time   NA 137 07/02/2013 1315   NA 135* 07/01/2013 1348   NA 143 08/14/2012 0907   K 4.3 07/02/2013 1315   K 5.0 07/01/2013 1348   K 3.9 08/14/2012 0907   CL 100 07/02/2013 1315   CL 106 08/14/2012 0907   CO2 28 07/02/2013 1315   CO2 24 07/01/2013 1348   CO2 25 08/14/2012 0907   BUN 22 07/02/2013 1315   BUN 26.2* 07/01/2013 1348   BUN 10 08/14/2012 0907   CREATININE 1.8* 07/02/2013 1315   CREATININE 2.3* 07/01/2013 1348   CREATININE 0.71 08/14/2012 0907      Component Value Date/Time   CALCIUM 8.6 07/02/2013 1315   CALCIUM 9.5 07/01/2013 1348   CALCIUM 9.0 08/14/2012 0907   ALKPHOS 103* 07/02/2013 1315   ALKPHOS 122 07/01/2013 1348   ALKPHOS 270* 08/14/2012 0907   AST 27 07/02/2013 1315   AST 24 07/01/2013 1348   AST 31 08/14/2012 0907   ALT 14 07/02/2013 1315   ALT 10 07/01/2013 1348   ALT 37*  08/14/2012 0907   BILITOT 0.50 07/02/2013 1315   BILITOT 0.28 07/01/2013 1348   BILITOT 0.2* 08/14/2012 0907         Impression and Plan: [name] is 58 year old white female with metastatic lung cancer. She has had 3 or 4 different lines the chemotherapy. He now is on what I would consider a last line therapy.  Her performance status is not that great. We are giving her some IV fluids. Her renal function is a little but better. Her calcium is not elevated.  We need to try to help improve her quality of life. I want to try to get  her to be a little more functional.  We will continue to try to treat her possible. She really is making a good effort. I think that she should stop working. I believe this is just too much for her.  We will go ahead and try to get her back in another week or so. She will finish up her radiation therapy I think early next week. We will try to give her next dose of Opdivo right afterwards.  Volanda Napoleon, MD 6/18/20156:52 PM

## 2013-07-05 NOTE — Progress Notes (Signed)
  Radiation Oncology         [phone] ________________________________  Name: [name] MRN: [id number]  Date: 07/03/2013  DOB: 01-23-1955  End of Treatment Note  Diagnosis:   Brain Metastases, Adenocarcinoma of lung     Indication for treatment:  palliative       Radiation treatment dates:   06/16/2013-07/03/2013  Site/dose:   Whole brain / 35 Gy in 14 fractions  Beams/energy:   2 fields / 6MV photons  Narrative: The patient tolerated radiation treatment relatively well.    She met with palliative care during her treatment course.  Plan: The patient has completed radiation treatment. The patient will return to radiation oncology clinic for routine followup in one month. I advised them to call or return sooner if they have any questions or concerns related to their recovery or treatment.  -----------------------------------  Eppie Gibson, MD

## 2013-07-07 ENCOUNTER — Ambulatory Visit: Payer: BC Managed Care – PPO | Admitting: Hematology & Oncology

## 2013-07-07 ENCOUNTER — Ambulatory Visit: Payer: BC Managed Care – PPO

## 2013-07-07 ENCOUNTER — Encounter: Payer: Self-pay | Admitting: *Deleted

## 2013-07-07 ENCOUNTER — Other Ambulatory Visit: Payer: BC Managed Care – PPO | Admitting: Lab

## 2013-07-07 ENCOUNTER — Ambulatory Visit (HOSPITAL_BASED_OUTPATIENT_CLINIC_OR_DEPARTMENT_OTHER)
Admission: RE | Admit: 2013-07-07 | Discharge: 2013-07-07 | Disposition: A | Discharge status: Home / Self Care | Payer: BC Managed Care – PPO | Source: Ambulatory Visit | Attending: Hematology & Oncology | Admitting: Hematology & Oncology

## 2013-07-07 ENCOUNTER — Other Ambulatory Visit: Payer: Self-pay | Admitting: Nurse Practitioner

## 2013-07-07 ENCOUNTER — Ambulatory Visit (HOSPITAL_COMMUNITY)
Admission: RE | Admit: 2013-07-07 | Discharge: 2013-07-07 | Disposition: A | Discharge status: Home / Self Care | Payer: BC Managed Care – PPO | Source: Ambulatory Visit | Attending: Hematology & Oncology | Admitting: Hematology & Oncology

## 2013-07-07 ENCOUNTER — Ambulatory Visit (HOSPITAL_COMMUNITY)
Admission: RE | Admit: 2013-07-07 | Discharge: 2013-07-07 | Disposition: A | Discharge status: Home / Self Care | Payer: BC Managed Care – PPO | Source: Ambulatory Visit | Attending: Radiology | Admitting: Radiology

## 2013-07-07 DIAGNOSIS — C7800 Secondary malignant neoplasm of unspecified lung: Secondary | ICD-10-CM | POA: Insufficient documentation

## 2013-07-07 DIAGNOSIS — C3490 Malignant neoplasm of unspecified part of unspecified bronchus or lung: Secondary | ICD-10-CM

## 2013-07-07 DIAGNOSIS — Z853 Personal history of malignant neoplasm of breast: Secondary | ICD-10-CM | POA: Insufficient documentation

## 2013-07-07 DIAGNOSIS — J90 Pleural effusion, not elsewhere classified: Secondary | ICD-10-CM | POA: Insufficient documentation

## 2013-07-07 DIAGNOSIS — J9819 Other pulmonary collapse: Secondary | ICD-10-CM | POA: Insufficient documentation

## 2013-07-07 DIAGNOSIS — C50919 Malignant neoplasm of unspecified site of unspecified female breast: Secondary | ICD-10-CM

## 2013-07-07 NOTE — Procedures (Signed)
B pleural effusion L larger than R  L US guided thora  950 cc greenish yellow fluid Did well  cxr pending

## 2013-07-07 NOTE — Progress Notes (Signed)
Pt called and stated she thought last week that she did not need any home oxygen. However after the weekend and patient was having difficulty catching her breath and dyspnea with minimum excertion.  Per Dr. Marin Olp pt will come in for chest xray and to be evaluated for home oxygen use. Pt verbalized understanding.

## 2013-07-07 NOTE — Progress Notes (Signed)
Pt had an oxygen saturation of 82% on room air at rest. When the patient walked around the facility she had oxygen saturation of 81% on room air. Pt was placed on 2liters nasal cannula of oxygen and her oxygen saturation was 90%.

## 2013-07-08 ENCOUNTER — Other Ambulatory Visit: Payer: Self-pay | Admitting: Nurse Practitioner

## 2013-07-08 ENCOUNTER — Encounter: Payer: Self-pay | Admitting: Nurse Practitioner

## 2013-07-08 ENCOUNTER — Telehealth: Payer: Self-pay | Admitting: Nurse Practitioner

## 2013-07-08 ENCOUNTER — Ambulatory Visit (HOSPITAL_BASED_OUTPATIENT_CLINIC_OR_DEPARTMENT_OTHER): Payer: BC Managed Care – PPO

## 2013-07-08 DIAGNOSIS — C50919 Malignant neoplasm of unspecified site of unspecified female breast: Secondary | ICD-10-CM

## 2013-07-08 DIAGNOSIS — C3490 Malignant neoplasm of unspecified part of unspecified bronchus or lung: Secondary | ICD-10-CM

## 2013-07-08 NOTE — Progress Notes (Signed)
Patient came into office today to be assessed post-thoracentesis (941ml) of fluid removal. Continues to have some difficulty with dyspnea. Pt was monitored at rest with an 02 sat of 86%. After a 88min rest period pt was ambulated on Room air and her oxygen levels were initially at 88% which then decreased to 82-83%. Applied 2L/[place] and sat increased to 91% during ambulation. Patient allowed to then rest for 40min on 2L/[place] and sats were 91-92%. Oxygen discontinued and sats rechecked with a result of 86%. Pt complains of shortness of breath with rest and minimum exertion. Per Dr. Marin Olp she will be set-up to receive home oxygen therapy 2L/[place] with the ability to titrate based on need. Orders and information will be sent to [facility].

## 2013-07-11 ENCOUNTER — Ambulatory Visit (HOSPITAL_BASED_OUTPATIENT_CLINIC_OR_DEPARTMENT_OTHER): Payer: BC Managed Care – PPO

## 2013-07-11 ENCOUNTER — Ambulatory Visit (HOSPITAL_BASED_OUTPATIENT_CLINIC_OR_DEPARTMENT_OTHER)
Admission: RE | Admit: 2013-07-11 | Discharge: 2013-07-11 | Disposition: A | Discharge status: Home / Self Care | Payer: BC Managed Care – PPO | Source: Ambulatory Visit | Attending: Hematology & Oncology | Admitting: Hematology & Oncology

## 2013-07-11 ENCOUNTER — Other Ambulatory Visit: Payer: Self-pay | Admitting: *Deleted

## 2013-07-11 ENCOUNTER — Other Ambulatory Visit (HOSPITAL_BASED_OUTPATIENT_CLINIC_OR_DEPARTMENT_OTHER): Payer: BC Managed Care – PPO | Admitting: Lab

## 2013-07-11 ENCOUNTER — Telehealth: Payer: Self-pay | Admitting: Hematology & Oncology

## 2013-07-11 ENCOUNTER — Ambulatory Visit (HOSPITAL_BASED_OUTPATIENT_CLINIC_OR_DEPARTMENT_OTHER): Payer: BC Managed Care – PPO | Admitting: Hematology & Oncology

## 2013-07-11 DIAGNOSIS — C7951 Secondary malignant neoplasm of bone: Secondary | ICD-10-CM

## 2013-07-11 DIAGNOSIS — C7952 Secondary malignant neoplasm of bone marrow: Secondary | ICD-10-CM

## 2013-07-11 DIAGNOSIS — B370 Candidal stomatitis: Secondary | ICD-10-CM

## 2013-07-11 DIAGNOSIS — C3492 Malignant neoplasm of unspecified part of left bronchus or lung: Secondary | ICD-10-CM

## 2013-07-11 DIAGNOSIS — R000 Tachycardia, unspecified: Secondary | ICD-10-CM

## 2013-07-11 DIAGNOSIS — C3490 Malignant neoplasm of unspecified part of unspecified bronchus or lung: Secondary | ICD-10-CM

## 2013-07-11 DIAGNOSIS — Z5112 Encounter for antineoplastic immunotherapy: Secondary | ICD-10-CM

## 2013-07-11 DIAGNOSIS — R531 Weakness: Secondary | ICD-10-CM

## 2013-07-11 DIAGNOSIS — C3430 Malignant neoplasm of lower lobe, unspecified bronchus or lung: Secondary | ICD-10-CM

## 2013-07-11 DIAGNOSIS — C50919 Malignant neoplasm of unspecified site of unspecified female breast: Secondary | ICD-10-CM

## 2013-07-11 DIAGNOSIS — J90 Pleural effusion, not elsewhere classified: Secondary | ICD-10-CM

## 2013-07-11 LAB — CMP (CANCER CENTER ONLY)
ALBUMIN: 2.5 g/dL — AB (ref 3.3–5.5)
ALT(SGPT): 35 U/L (ref 10–47)
AST: 33 U/L (ref 11–38)
Alkaline Phosphatase: 98 U/L — ABNORMAL HIGH (ref 26–84)
BUN, Bld: 25 mg/dL — ABNORMAL HIGH (ref 7–22)
CALCIUM: 8.8 mg/dL (ref 8.0–10.3)
CHLORIDE: 100 meq/L (ref 98–108)
CO2: 28 mEq/L (ref 18–33)
Creat: 1.3 mg/dl — ABNORMAL HIGH (ref 0.6–1.2)
GLUCOSE: 80 mg/dL (ref 73–118)
POTASSIUM: 3.9 meq/L (ref 3.3–4.7)
SODIUM: 139 meq/L (ref 128–145)
TOTAL PROTEIN: 6.6 g/dL (ref 6.4–8.1)
Total Bilirubin: 0.5 mg/dl (ref 0.20–1.60)

## 2013-07-11 LAB — CBC WITH DIFFERENTIAL (CANCER CENTER ONLY)
BASO#: 0 10*3/uL (ref 0.0–0.2)
BASO%: 0.2 % (ref 0.0–2.0)
EOS%: 0.2 % (ref 0.0–7.0)
Eosinophils Absolute: 0 10*3/uL (ref 0.0–0.5)
HCT: 37.8 % (ref 34.8–46.6)
HGB: 12.2 g/dL (ref 11.6–15.9)
LYMPH#: 0.9 10*3/uL (ref 0.9–3.3)
LYMPH%: 6 % — AB (ref 14.0–48.0)
MCH: 31.6 pg (ref 26.0–34.0)
MCHC: 32.3 g/dL (ref 32.0–36.0)
MCV: 98 fL (ref 81–101)
MONO#: 0.8 10*3/uL (ref 0.1–0.9)
MONO%: 5.4 % (ref 0.0–13.0)
NEUT%: 88.2 % — ABNORMAL HIGH (ref 39.6–80.0)
NEUTROS ABS: 13.1 10*3/uL — AB (ref 1.5–6.5)
Platelets: 386 10*3/uL (ref 145–400)
RBC: 3.86 10*6/uL (ref 3.70–5.32)
RDW: 18.8 % — ABNORMAL HIGH (ref 11.1–15.7)
WBC: 14.9 10*3/uL — ABNORMAL HIGH (ref 3.9–10.0)

## 2013-07-11 MED ORDER — HEPARIN SOD (PORK) LOCK FLUSH 100 UNIT/ML IV SOLN
500.0000 [IU] | Freq: Once | INTRAVENOUS | Status: AC | PRN
  Administered 2013-07-11: 500 [IU]
  Filled 2013-07-11: qty 5

## 2013-07-11 MED ORDER — METOPROLOL TARTRATE 50 MG PO TABS: 50.0000 mg | ORAL_TABLET | Freq: Two times a day (BID) | ORAL | Status: AC

## 2013-07-11 MED ORDER — FLUCONAZOLE IN SODIUM CHLORIDE 200-0.9 MG/100ML-% IV SOLN
200.0000 mg | Freq: Once | INTRAVENOUS | Status: AC
  Administered 2013-07-11: 200 mg via INTRAVENOUS
  Filled 2013-07-11: qty 100

## 2013-07-11 MED ORDER — SODIUM CHLORIDE 0.9 % IJ SOLN
10.0000 mL | INTRAMUSCULAR | Status: DC | PRN
  Administered 2013-07-11: 10 mL
  Filled 2013-07-11: qty 10

## 2013-07-11 MED ORDER — SODIUM CHLORIDE 0.9 % IV SOLN
Freq: Once | INTRAVENOUS | Status: AC
  Administered 2013-07-11: 14:00:00 via INTRAVENOUS

## 2013-07-11 MED ORDER — SODIUM CHLORIDE 0.9 % IV SOLN
3.0000 mg/kg | Freq: Once | INTRAVENOUS | Status: AC
  Administered 2013-07-11: 200 mg via INTRAVENOUS
  Filled 2013-07-11: qty 20

## 2013-07-11 MED ORDER — FLUCONAZOLE 100 MG PO TABS: 100.0000 mg | ORAL_TABLET | Freq: Every day | ORAL | Status: AC

## 2013-07-11 MED ORDER — CLINDAMYCIN PHOSPHATE 1 % EX SOLN: Freq: Two times a day (BID) | CUTANEOUS | Status: DC

## 2013-07-11 MED ORDER — SODIUM CHLORIDE 0.9 % IJ SOLN
3.0000 mL | INTRAMUSCULAR | Status: DC | PRN
  Filled 2013-07-11: qty 10

## 2013-07-11 MED ORDER — ZOLEDRONIC ACID 4 MG/5ML IV CONC
3.0000 mg | Freq: Once | INTRAVENOUS | Status: AC
  Administered 2013-07-11: 3 mg via INTRAVENOUS
  Filled 2013-07-11: qty 3.75

## 2013-07-11 MED ORDER — HEPARIN SOD (PORK) LOCK FLUSH 100 UNIT/ML IV SOLN
250.0000 [IU] | Freq: Once | INTRAVENOUS | Status: DC | PRN
  Filled 2013-07-11: qty 5

## 2013-07-11 NOTE — Progress Notes (Signed)
Hematology and Oncology Follow Up Visit  [name] [id number] 15-May-1955 58 y.o. 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  DRikeris back for followup. She looks a little better then we last saw her. She did have a thoracentesis of the left lung. She had no 150 cc of fluid removed. This was on June 22. Cytology was not sent off. I felt this was more than likely her malignancy.  We did get another chest x-ray on her today. The fluid has not repeat accumulated. She does have some pulmonary nodules bilaterally.  We have her oxygen at home. This has helped her a low blood. She is still trying to work.  She has a lot of thrush today. She is on Decadron. The Decadron as to try to help with her appetite. I will go ahead and give her a dose of IV Diflucan. We will put her on some oral Diflucan. This should help her thrush and her appetite.  She is not hurting. There are she's not had any diarrhea or constipation.  She tolerated the first dose of Opdivo quite well.  Overall, her performance status is ECOG 2.  We need to do a better job in getting her some more portable oxygen that she can actually use.  Medications: Current outpatient prescriptions:ALPRAZolam (XANAX) 0.25 MG tablet, Take 0.25-0.5 mg by mouth as needed. For anxiety., Disp: , Rfl: ;  amLODipine (NORVASC) 2.5 MG tablet, Take 2.5 mg by mouth daily., Disp: , Rfl: ;  dexamethasone (DECADRON) 4 MG tablet, Take 1 tablet (4 mg total) by mouth 2 (two) times daily with a meal., Disp: 60 tablet, Rfl: 2 dronabinol (MARINOL) 2.5 MG capsule, Take 1 capsule (2.5 mg total) by mouth 2 (two) times daily before a meal., Disp: 60 capsule, Rfl: 0;  emollient (BIAFINE) cream, Apply topically as needed., Disp: , Rfl: ;  exemestane (AROMASIN) 25 MG tablet, TAKE 1  TABLET BY MOUTH IN THE MORNING AFTER BREAKFAST, Disp: 30 tablet, Rfl: 6;  fluconazole (DIFLUCAN) 100 MG tablet, Take 1 tablet (100 mg total) by mouth daily., Disp: 30 tablet, Rfl: 3 HYDROcodone-acetaminophen (NORCO) 10-325 MG per tablet, Take 1 tablet by mouth every 6 (six) hours as needed., Disp: 120 tablet, Rfl: 0;  ibuprofen (ADVIL,MOTRIN) 200 MG tablet, Take 600 mg by mouth every 6 (six) hours as needed for pain. , Disp: , Rfl: ;  lidocaine-prilocaine (EMLA) cream, Apply topically as needed. , Disp: , Rfl: ;  loperamide (IMODIUM) 2 MG capsule, Take 2 mg by mouth 4 (four) times daily as needed. For diarrhea, Disp: , Rfl:  Loratadine (CLARITIN) 10 MG CAPS, Take by mouth as needed. , Disp: , Rfl: ;  LORazepam (ATIVAN) 0.5 MG tablet, 0.5 mg at bedtime. , Disp: , Rfl: ;  methylphenidate (RITALIN) 10 MG tablet, Take 1 pill TWICE a day., Disp: 60 tablet, Rfl: 0;  metoprolol (LOPRESSOR) 50 MG tablet, Take 1 tablet (50 mg total) by mouth 2 (two) times daily., Disp: 60 tablet, Rfl: 3 naphazoline (NAPHCON) 0.1 % ophthalmic solution, Place 1 drop into both eyes 4 (four) times daily as needed., Disp: 15 mL, Rfl: 2;  ondansetron (ZOFRAN) 8 MG tablet, Take 1 tablet (8 mg total) by mouth every 8 (eight) hours as needed for nausea or vomiting., Disp: 20  tablet, Rfl: 5;  Probiotic Product (ALIGN) 4 MG CAPS, Take by mouth every morning., Disp: , Rfl:  prochlorperazine (COMPAZINE) 10 MG tablet, TAKE 1 TABLET (10 MG TOTAL) BY MOUTH EVERY 6 (SIX) HOURS AS NEEDED FOR NAUSEA OR VOMITING., Disp: 30 tablet, Rfl: 1;  zolendronic acid (ZOMETA) 4 MG/5ML injection, Inject 4 mg into the vein every 30 (thirty) days., Disp: , Rfl: ;  zolpidem (AMBIEN) 5 MG tablet, Take 1 tablet (5 mg total) by mouth at bedtime as needed for sleep., Disp: 30 tablet, Rfl: 2 No current facility-administered medications for this visit. Facility-Administered Medications Ordered in Other Visits: heparin lock flush 100 unit/mL, 250 Units, Intracatheter, Once  PRN, Volanda Napoleon, MD;  sodium chloride 0.9 % injection 10 mL, 10 mL, Intracatheter, PRN, Volanda Napoleon, MD, 10 mL at 09/04/12 1511;  sodium chloride 0.9 % injection 10 mL, 10 mL, Intravenous, PRN, Volanda Napoleon, MD, 10 mL at 07/02/13 1529 sodium chloride 0.9 % injection 10 mL, 10 mL, Intracatheter, PRN, Volanda Napoleon, MD, 10 mL at 07/11/13 1655;  sodium chloride 0.9 % injection 3 mL, 3 mL, Intravenous, PRN, Volanda Napoleon, MD;  topical emolient (BIAFINE) emulsion, , Topical, BID, Eppie Gibson, MD  Allergies: No Known Allergies  Past Medical History, Surgical history, Social history, and Family History were reviewed and updated.  Review of Systems: As above  Physical Exam:  vitals were not taken for this visit.  Somewhat ill appear white female. Head and neck exam shows no ocular or oral lesions. There is no scleral icterus. She has no adenopathy in the neck. She does have a lot of pharyngeal thrush. Lungs are some decreased breath sounds in the left lung field. Right lung field is okay. Cardiac exam tachycardia regular. Abdomen is soft. She has good bowel sounds. There is no fluid wave. There is no palpable liver or spleen tip. By exam no tenderness over the spine. Very shows no clubbing cyanosis or edema. She has decreased strength in her legs. She has decent range of motion of her joints. Skin exam no rashes. Neurological exam is nonfocal.  Lab Results  Component Value Date   WBC 14.9* 07/11/2013   HGB 12.2 07/11/2013   HCT 37.8 07/11/2013   MCV 98 07/11/2013   PLT 386 07/11/2013     Chemistry      Component Value Date/Time   NA 139 07/11/2013 1138   NA 135* 07/01/2013 1348   NA 143 08/14/2012 0907   K 3.9 07/11/2013 1138   K 5.0 07/01/2013 1348   K 3.9 08/14/2012 0907   CL 100 07/11/2013 1138   CL 106 08/14/2012 0907   CO2 28 07/11/2013 1138   CO2 24 07/01/2013 1348   CO2 25 08/14/2012 0907   BUN 25* 07/11/2013 1138   BUN 26.2* 07/01/2013 1348   BUN 10 08/14/2012 0907   CREATININE  1.3* 07/11/2013 1138   CREATININE 2.3* 07/01/2013 1348   CREATININE 0.71 08/14/2012 0907      Component Value Date/Time   CALCIUM 8.8 07/11/2013 1138   CALCIUM 9.5 07/01/2013 1348   CALCIUM 9.0 08/14/2012 0907   ALKPHOS 98* 07/11/2013 1138   ALKPHOS 122 07/01/2013 1348   ALKPHOS 270* 08/14/2012 0907   AST 33 07/11/2013 1138   AST 24 07/01/2013 1348   AST 31 08/14/2012 0907   ALT 35 07/11/2013 1138   ALT 10 07/01/2013 1348   ALT 37* 08/14/2012 0907   BILITOT 0.50 07/11/2013 1138   BILITOT 0.28  07/01/2013 1348   BILITOT 0.2* 08/14/2012 0907         Impression and Plan: Ms. Boggess is 58 year old female with metastatic adenocarcinoma. She is now on, essentially, last line of therapy. Hopefully, we will see a response to the Opdivo.  I forgot to mention that she did have radiation therapy to the brain. She completed this about a week ago.  We will go ahead and treat her today.  Hopefully, the thrush will resolve quickly to help with her appetite.  She is quite tachycardic. I'll put her on Lopressor (50 mg by mouth twice a day). This issue billow to slow down her heart.  She still wants to work. She still drives herself to work. I think this is okay. I certainly do not want to have her activities that she likes to do. Working as a very important for her.  We will plan to get her back in 2 more weeks. Will see how she is doing. We will consider another PET scan or CT scan afterwards.   Volanda Napoleon, MD 6/26/20156:14 PM

## 2013-07-11 NOTE — Patient Instructions (Addendum)
Fluconazole injection What is this medicine? FLUCONAZOLE (floo KON na zole) is an antifungal medicine. It is used to treat or prevent certain kinds of fungal or yeast infections. This medicine may be used for other purposes; ask your health care provider or pharmacist if you have questions. COMMON BRAND NAME(S): Diflucan What should I tell my health care provider before I take this medicine? They need to know if you have any of these conditions: -history of irregular heart beat -kidney disease -an unusual or allergic reaction to fluconazole, other antifungal medicines, foods, dyes or preservatives -pregnant or trying to get pregnant -breast-feeding How should I use this medicine? This medicine is for injection into a vein. It is usually given by a health care professional in a hospital or clinic setting. If you get this medicine at home, you will be taught how to prepare and give this medicine. Use exactly as directed. Take your medicine at regular intervals. Do not take your medicine more often than directed. It is important that you put your used needles and syringes in a special sharps container. Do not put them in a trash can. If you do not have a sharps container, call your pharmacist or healthcare provider to get one. Talk to your pediatrician regarding the use of this medicine in children. Special care may be needed. Overdosage: If you think you have taken too much of this medicine contact a poison control center or emergency room at once. NOTE: This medicine is only for you. Do not share this medicine with others. What if I miss a dose? This does not apply. What may interact with this medicine? Do not take this medicine with any of the following medications: -cisapride -pimozide -red yeast rice This medicine may also interact with the following medications: -birth control pills -cyclosporine -diuretics like hydrochlorothiazide -medicines for diabetes that are taken by  mouth -medicines for high cholesterol like atorvastatin, lovastatin or simvastatin -phenytoin -ramelteon -rifabutin -rifampin -some medicines for anxiety or sleep -tacrolimus -terfenadine -theophylline -tofacitinib -warfarin This list may not describe all possible interactions. Give your health care provider a list of all the medicines, herbs, non-prescription drugs, or dietary supplements you use. Also tell them if you smoke, drink alcohol, or use illegal drugs. Some items may interact with your medicine. What should I watch for while using this medicine? Tell your doctor if your symptoms do not improve. If you are taking this medicine for a long time you may need blood work. Some fungal infections need many weeks or months of treatment to cure completely. Alcohol can increase possible damage to your liver from this medicine. Avoid alcoholic drinks. What side effects may I notice from receiving this medicine? Side effects that you should report to your doctor or health care professional as soon as possible: -allergic reactions like skin rash or itching, hives, swelling of the lips, mouth, tongue, or throat -dark urine -feeling dizzy or faint -irregular heartbeat or chest pain -pain, redness at site of injection -redness, blistering, peeling or loosening of the skin, including inside the mouth -stomach pain -trouble breathing -unusual bruising or bleeding -vomiting -yellowing of the eyes or skin Side effects that usually do not require medical attention (report to your doctor or health care professional if they continue or are bothersome): -changes in how food tastes -diarrhea -headache -stomach upset, nausea This list may not describe all possible side effects. Call your doctor for medical advice about side effects. You may report side effects to FDA at 1-800-FDA-1088. Where should  I keep my medicine? Keep out of the reach of children. If you are using this medicine at home, you  will be instructed on how to store this medicine. Throw away any unused medicine after the expiration date on the label. NOTE: This sheet is a summary. It may not cover all possible information. If you have questions about this medicine, talk to your doctor, pharmacist, or health care provider.  2015, Elsevier/Gold Standard. (2012-08-10 15:51:41) Nivolumab injection What is this medicine? NIVOLUMAB (nye VOL ue mab) is used to treat certain types of melanoma. This medicine may be used for other purposes; ask your health care provider or pharmacist if you have questions. COMMON BRAND NAME(S): Opdivo What should I tell my health care provider before I take this medicine? They need to know if you have any of these conditions: -eye disease, vision problems -history of pancreatitis -immune system problems -inflammatory bowel disease -kidney disease -liver disease -lung disease -lupus -myasthenia gravis -multiple sclerosis -organ transplant -stomach or intestine problems -thyroid disease -tingling of the fingers or toes, or other nerve disorder -an unusual or allergic reaction to nivolumab, other medicines, foods, dyes, or preservatives -pregnant or trying to get pregnant -breast-feeding How should I use this medicine? This medicine is for infusion into a vein. It is given by a health care professional in a hospital or clinic setting. A special MedGuide will be given to you before each treatment. Be sure to read this information carefully each time. Talk to your pediatrician regarding the use of this medicine in children. Special care may be needed. Overdosage: If you think you've taken too much of this medicine contact a poison control center or emergency room at once. Overdosage: If you think you have taken too much of this medicine contact a poison control center or emergency room at once. NOTE: This medicine is only for you. Do not share this medicine with others. What if I miss a  dose? It is important not to miss your dose. Call your doctor or health care professional if you are unable to keep an appointment. What may interact with this medicine? Interactions have not been studied. This list may not describe all possible interactions. Give your health care provider a list of all the medicines, herbs, non-prescription drugs, or dietary supplements you use. Also tell them if you smoke, drink alcohol, or use illegal drugs. Some items may interact with your medicine. What should I watch for while using this medicine? Tell your doctor or healthcare professional if your symptoms do not start to get better or if they get worse. Your condition will be monitored carefully while you are receiving this medicine. You may need blood work done while you are taking this medicine. What side effects may I notice from receiving this medicine? Side effects that you should report to your doctor or health care professional as soon as possible: -allergic reactions like skin rash, itching or hives, swelling of the face, lips, or tongue -black, tarry stools -bloody or watery diarrhea -changes in vision -chills -cough -depressed mood -eye pain -feeling anxious -fever -general ill feeling or flu-like symptoms -hair loss -loss of appetite -pain, tingling, numbness in the hands or feet -redness, blistering, peeling or loosening of the skin, including inside the mouth -red pinpoint spots on skin -signs and symptoms of a dangerous change in heartbeat or heart rhythm like chest pain; dizziness; fast or irregular heartbeat; palpitations; feeling faint or lightheaded, falls; breathing problems -signs and symptoms of high blood sugar such  as dizziness; dry mouth; dry skin; fruity breath; nausea; stomach pain; increased hunger or thirst; increased urination -signs and symptoms of kidney injury like trouble passing urine or change in the amount of urine -signs and symptoms of liver injury like dark  yellow or brown urine; general ill feeling or flu-like symptoms; light-colored stools; loss of appetite; nausea; right upper belly pain; unusually weak or tired; yellowing of the eyes or skin -swelling of the ankles, feet, hands -weight gain This list may not describe all possible side effects. Call your doctor for medical advice about side effects. You may report side effects to FDA at 1-800-FDA-1088. Where should I keep my medicine? This drug is given in a hospital or clinic and will not be stored at home. NOTE: This sheet is a summary. It may not cover all possible information. If you have questions about this medicine, talk to your doctor, pharmacist, or health care provider.  2015, Elsevier/Gold Standard. (2013-01-14 17:16:03) Zoledronic Acid injection (Hypercalcemia, Oncology) What is this medicine? ZOLEDRONIC ACID (ZOE le dron ik AS id) lowers the amount of calcium loss from bone. It is used to treat too much calcium in your blood from cancer. It is also used to prevent complications of cancer that has spread to the bone. This medicine may be used for other purposes; ask your health care provider or pharmacist if you have questions. COMMON BRAND NAME(S): Zometa What should I tell my health care provider before I take this medicine? They need to know if you have any of these conditions: -aspirin-sensitive asthma -cancer, especially if you are receiving medicines used to treat cancer -dental disease or wear dentures -infection -kidney disease -receiving corticosteroids like dexamethasone or prednisone -an unusual or allergic reaction to zoledronic acid, other medicines, foods, dyes, or preservatives -pregnant or trying to get pregnant -breast-feeding How should I use this medicine? This medicine is for infusion into a vein. It is given by a health care professional in a hospital or clinic setting. Talk to your pediatrician regarding the use of this medicine in children. Special care  may be needed. Overdosage: If you think you have taken too much of this medicine contact a poison control center or emergency room at once. NOTE: This medicine is only for you. Do not share this medicine with others. What if I miss a dose? It is important not to miss your dose. Call your doctor or health care professional if you are unable to keep an appointment. What may interact with this medicine? -certain antibiotics given by injection -NSAIDs, medicines for pain and inflammation, like ibuprofen or naproxen -some diuretics like bumetanide, furosemide -teriparatide -thalidomide This list may not describe all possible interactions. Give your health care provider a list of all the medicines, herbs, non-prescription drugs, or dietary supplements you use. Also tell them if you smoke, drink alcohol, or use illegal drugs. Some items may interact with your medicine. What should I watch for while using this medicine? Visit your doctor or health care professional for regular checkups. It may be some time before you see the benefit from this medicine. Do not stop taking your medicine unless your doctor tells you to. Your doctor may order blood tests or other tests to see how you are doing. Women should inform their doctor if they wish to become pregnant or think they might be pregnant. There is a potential for serious side effects to an unborn child. Talk to your health care professional or pharmacist for more information. You should make  sure that you get enough calcium and vitamin D while you are taking this medicine. Discuss the foods you eat and the vitamins you take with your health care professional. Some people who take this medicine have severe bone, joint, and/or muscle pain. This medicine may also increase your risk for jaw problems or a broken thigh bone. Tell your doctor right away if you have severe pain in your jaw, bones, joints, or muscles. Tell your doctor if you have any pain that does not  go away or that gets worse. Tell your dentist and dental surgeon that you are taking this medicine. You should not have major dental surgery while on this medicine. See your dentist to have a dental exam and fix any dental problems before starting this medicine. Take good care of your teeth while on this medicine. Make sure you see your dentist for regular follow-up appointments. What side effects may I notice from receiving this medicine? Side effects that you should report to your doctor or health care professional as soon as possible: -allergic reactions like skin rash, itching or hives, swelling of the face, lips, or tongue -anxiety, confusion, or depression -breathing problems -changes in vision -eye pain -feeling faint or lightheaded, falls -jaw pain, especially after dental work -mouth sores -muscle cramps, stiffness, or weakness -trouble passing urine or change in the amount of urine Side effects that usually do not require medical attention (report to your doctor or health care professional if they continue or are bothersome): -bone, joint, or muscle pain -constipation -diarrhea -fever -hair loss -irritation at site where injected -loss of appetite -nausea, vomiting -stomach upset -trouble sleeping -trouble swallowing -weak or tired This list may not describe all possible side effects. Call your doctor for medical advice about side effects. You may report side effects to FDA at 1-800-FDA-1088. Where should I keep my medicine? This drug is given in a hospital or clinic and will not be stored at home. NOTE: This sheet is a summary. It may not cover all possible information. If you have questions about this medicine, talk to your doctor, pharmacist, or health care provider.  2015, Elsevier/Gold Standard. (2012-06-13 13:03:13)

## 2013-07-11 NOTE — Addendum Note (Signed)
Addended by: Volanda Napoleon on: 07/11/2013 06:52 PM   Modules accepted: Orders

## 2013-07-11 NOTE — Telephone Encounter (Signed)
Thank you for completing your registration online.  Your card is now ready to use.  Your confirmation number is [id number].  Simply hand your Neulasta FIRST STEP Program card over to the office staff when you arrive for your Neulasta(pegfilgrastim) treatment. The card can then be used to help cover the cost of your deductible, co-insurance, and/or co-payment for your Neulasta.

## 2013-07-11 NOTE — Progress Notes (Signed)
Creatinine 1.3, OK to give Zometa per Dr. Marin Olp.

## 2013-07-11 NOTE — Telephone Encounter (Signed)
[employer] [place] - NPR  J7040 PR NORMAL SALINE SOLUTION INFUS  J1100 PR DEXAMETHASONE SODIUM PHOS J1642 PR INJ HEPARIN SODIUM PER 10 U J7050 PR NORMAL SALINE SOLUTION INFUS  [id number] NIVOLUMAB, (OPDIVO), 100 MG/10 ML   A4216 PR STERILE WATER/SALINE, 10 ML  J1642 PR INJ HEPARIN SODIUM PER 10 U Q0163 PR DIPHENHYDRAMINE HCL 50MG    J7050 PR NORMAL SALINE SOLUTION INFUS  J1940 PR FUROSEMIDE INJECTION  J2505 PR INJECTION, PEGFILGRASTIM 6MG  J1200 PR DIPHENHYDRAMINE HCL INJECTIO  J2405 PR ONDANSETRON HCL INJECTION J9171 PR DOCETAXEL INJECTION J3420 PR VITAMIN B12 INJECTION J7042 PR 5% DEXTROSE/NORMAL SALINE J3489 PR ZOLEDRONIC ACID 1MG    Adenocarcinoma of lung - Primary 162.9 Breast cancer

## 2013-07-14 ENCOUNTER — Encounter: Payer: Self-pay | Admitting: *Deleted

## 2013-07-14 ENCOUNTER — Telehealth: Payer: Self-pay | Admitting: Dietician

## 2013-07-14 NOTE — Progress Notes (Signed)
Patient's husband called to state that pt had a big nose bleed this weekend. Called the patient and let her know that she needs a humidification device for her oxygen to help lessen the chance of future nose bleeds. Patient's husband is going to [place] to pick this up.  Pt will call the office if she has future problems.

## 2013-07-14 NOTE — Telephone Encounter (Signed)
Brief Outpatient Oncology Nutrition Note  Patient has been identified to be at risk on malnutrition screen.  Wt Readings from Last 10 Encounters:  07/02/13 151 lb (68.493 kg)  06/30/13 141 lb 4.8 oz (64.093 kg)  06/23/13 141 lb 11.2 oz (64.275 kg)  06/16/13 144 lb 9.6 oz (65.59 kg)  05/27/13 148 lb 4 oz (67.246 kg)  05/19/13 151 lb 4.8 oz (68.629 kg)  05/12/13 154 lb 9.6 oz (70.126 kg)  05/05/13 156 lb 1.6 oz (70.806 kg)  04/23/13 153 lb 4.8 oz (69.536 kg)  03/19/13 152 lb (68.947 kg)    Dx:  Metastatic poorly differentiated adenocarcinoma of the lung s/p XRT last week to the brain.  Patient of Dr. Marin Olp.  Patient had thrush and is on home oxygen and experiences a difficulty breathing which are impacting her ability to eat.  Drinks boost and eats yogurt.  Encouraged small frequent meals.  Will mail patient coupons for Boost along with recipes to increase variety and nutrition, and a handout on tips for increasing calories and protein.  Contact information for the [place] [facility] provided.  Antonieta Iba, RD

## 2013-07-16 ENCOUNTER — Other Ambulatory Visit: Payer: Self-pay | Admitting: *Deleted

## 2013-07-16 ENCOUNTER — Inpatient Hospital Stay (HOSPITAL_BASED_OUTPATIENT_CLINIC_OR_DEPARTMENT_OTHER)
Admission: AD | Admit: 2013-07-16 | Discharge: 2013-07-18 | DRG: 187 | Disposition: A | Discharge status: Hospice | Payer: BC Managed Care – PPO | Source: Ambulatory Visit | Attending: Hematology & Oncology | Admitting: Hematology & Oncology

## 2013-07-16 ENCOUNTER — Encounter: Payer: Self-pay | Admitting: *Deleted

## 2013-07-16 ENCOUNTER — Ambulatory Visit (HOSPITAL_BASED_OUTPATIENT_CLINIC_OR_DEPARTMENT_OTHER)
Admission: RE | Admit: 2013-07-16 | Discharge: 2013-07-16 | Disposition: A | Discharge status: Home / Self Care | Payer: BC Managed Care – PPO | Source: Ambulatory Visit | Attending: Hematology & Oncology | Admitting: Hematology & Oncology

## 2013-07-16 ENCOUNTER — Other Ambulatory Visit: Payer: Self-pay | Admitting: Family

## 2013-07-16 ENCOUNTER — Encounter (HOSPITAL_COMMUNITY): Payer: Self-pay

## 2013-07-16 DIAGNOSIS — K589 Irritable bowel syndrome without diarrhea: Secondary | ICD-10-CM | POA: Diagnosis present

## 2013-07-16 DIAGNOSIS — C7931 Secondary malignant neoplasm of brain: Secondary | ICD-10-CM | POA: Diagnosis present

## 2013-07-16 DIAGNOSIS — B370 Candidal stomatitis: Secondary | ICD-10-CM | POA: Diagnosis present

## 2013-07-16 DIAGNOSIS — C3490 Malignant neoplasm of unspecified part of unspecified bronchus or lung: Secondary | ICD-10-CM

## 2013-07-16 DIAGNOSIS — E440 Moderate protein-calorie malnutrition: Secondary | ICD-10-CM | POA: Diagnosis present

## 2013-07-16 DIAGNOSIS — C3492 Malignant neoplasm of unspecified part of left bronchus or lung: Secondary | ICD-10-CM

## 2013-07-16 DIAGNOSIS — Z66 Do not resuscitate: Secondary | ICD-10-CM | POA: Diagnosis not present

## 2013-07-16 DIAGNOSIS — E43 Unspecified severe protein-calorie malnutrition: Secondary | ICD-10-CM | POA: Insufficient documentation

## 2013-07-16 DIAGNOSIS — C7952 Secondary malignant neoplasm of bone marrow: Secondary | ICD-10-CM

## 2013-07-16 DIAGNOSIS — R531 Weakness: Secondary | ICD-10-CM

## 2013-07-16 DIAGNOSIS — IMO0002 Reserved for concepts with insufficient information to code with codable children: Secondary | ICD-10-CM

## 2013-07-16 DIAGNOSIS — C7951 Secondary malignant neoplasm of bone: Secondary | ICD-10-CM | POA: Diagnosis present

## 2013-07-16 DIAGNOSIS — C50919 Malignant neoplasm of unspecified site of unspecified female breast: Secondary | ICD-10-CM | POA: Diagnosis present

## 2013-07-16 DIAGNOSIS — R0609 Other forms of dyspnea: Secondary | ICD-10-CM | POA: Diagnosis present

## 2013-07-16 DIAGNOSIS — E46 Unspecified protein-calorie malnutrition: Secondary | ICD-10-CM | POA: Diagnosis present

## 2013-07-16 DIAGNOSIS — Z923 Personal history of irradiation: Secondary | ICD-10-CM

## 2013-07-16 DIAGNOSIS — N289 Disorder of kidney and ureter, unspecified: Secondary | ICD-10-CM | POA: Diagnosis present

## 2013-07-16 DIAGNOSIS — Z9221 Personal history of antineoplastic chemotherapy: Secondary | ICD-10-CM

## 2013-07-16 DIAGNOSIS — I10 Essential (primary) hypertension: Secondary | ICD-10-CM | POA: Diagnosis present

## 2013-07-16 DIAGNOSIS — R0989 Other specified symptoms and signs involving the circulatory and respiratory systems: Secondary | ICD-10-CM | POA: Diagnosis present

## 2013-07-16 DIAGNOSIS — J90 Pleural effusion, not elsewhere classified: Principal | ICD-10-CM

## 2013-07-16 DIAGNOSIS — C7949 Secondary malignant neoplasm of other parts of nervous system: Secondary | ICD-10-CM

## 2013-07-16 LAB — CBC
HCT: 36.6 % (ref 36.0–46.0)
Hemoglobin: 11.6 g/dL — ABNORMAL LOW (ref 12.0–15.0)
MCH: 30.6 pg (ref 26.0–34.0)
MCHC: 31.7 g/dL (ref 30.0–36.0)
MCV: 96.6 fL (ref 78.0–100.0)
PLATELETS: 200 10*3/uL (ref 150–400)
RBC: 3.79 MIL/uL — AB (ref 3.87–5.11)
RDW: 19.6 % — ABNORMAL HIGH (ref 11.5–15.5)
WBC: 13.8 10*3/uL — ABNORMAL HIGH (ref 4.0–10.5)

## 2013-07-16 LAB — COMPREHENSIVE METABOLIC PANEL
ALT: 21 U/L (ref 0–35)
AST: 24 U/L (ref 0–37)
Albumin: 2.6 g/dL — ABNORMAL LOW (ref 3.5–5.2)
Alkaline Phosphatase: 121 U/L — ABNORMAL HIGH (ref 39–117)
Anion gap: 15 (ref 5–15)
BILIRUBIN TOTAL: 0.4 mg/dL (ref 0.3–1.2)
BUN: 51 mg/dL — ABNORMAL HIGH (ref 6–23)
CO2: 23 mEq/L (ref 19–32)
Calcium: 8.9 mg/dL (ref 8.4–10.5)
Chloride: 100 mEq/L (ref 96–112)
Creatinine, Ser: 1.55 mg/dL — ABNORMAL HIGH (ref 0.50–1.10)
GFR calc Af Amer: 41 mL/min — ABNORMAL LOW (ref >90)
GFR calc non Af Amer: 36 mL/min — ABNORMAL LOW (ref >90)
GLUCOSE: 118 mg/dL — AB (ref 70–99)
POTASSIUM: 4.8 meq/L (ref 3.7–5.3)
SODIUM: 138 meq/L (ref 137–147)
Total Protein: 6.5 g/dL (ref 6.0–8.3)

## 2013-07-16 LAB — APTT: aPTT: 25 seconds (ref 24–37)

## 2013-07-16 LAB — PROTIME-INR
INR: 1.17 (ref 0.00–1.49)
PROTHROMBIN TIME: 14.9 s (ref 11.6–15.2)

## 2013-07-16 MED ORDER — DRONABINOL 2.5 MG PO CAPS
2.5000 mg | ORAL_CAPSULE | Freq: Two times a day (BID) | ORAL | Status: DC
  Administered 2013-07-18: 2.5 mg via ORAL
  Filled 2013-07-16: qty 1

## 2013-07-16 MED ORDER — LOPERAMIDE HCL 2 MG PO CAPS: 2.0000 mg | ORAL_CAPSULE | Freq: Four times a day (QID) | ORAL | Status: DC | PRN

## 2013-07-16 MED ORDER — METHYLPHENIDATE HCL 5 MG PO TABS
10.0000 mg | ORAL_TABLET | Freq: Two times a day (BID) | ORAL | Status: DC
  Filled 2013-07-16: qty 2

## 2013-07-16 MED ORDER — HYDROCODONE-ACETAMINOPHEN 10-325 MG PO TABS
1.0000 | ORAL_TABLET | Freq: Four times a day (QID) | ORAL | Status: DC | PRN
  Administered 2013-07-17: 1 via ORAL
  Filled 2013-07-16: qty 1

## 2013-07-16 MED ORDER — CLINDAMYCIN PHOSPHATE 1 % EX SOLN
Freq: Two times a day (BID) | CUTANEOUS | Status: DC
  Administered 2013-07-17 – 2013-07-18 (×3): via TOPICAL
  Filled 2013-07-16: qty 30

## 2013-07-16 MED ORDER — FLUCONAZOLE 100 MG PO TABS
100.0000 mg | ORAL_TABLET | Freq: Every day | ORAL | Status: DC
  Administered 2013-07-17 – 2013-07-18 (×2): 100 mg via ORAL
  Filled 2013-07-16 (×3): qty 1

## 2013-07-16 MED ORDER — BIOTENE DRY MOUTH MT LIQD
15.0000 mL | Freq: Two times a day (BID) | OROMUCOSAL | Status: DC
  Administered 2013-07-16 – 2013-07-18 (×3): 15 mL via OROMUCOSAL

## 2013-07-16 MED ORDER — LORAZEPAM 0.5 MG PO TABS
0.5000 mg | ORAL_TABLET | Freq: Every day | ORAL | Status: DC
  Administered 2013-07-17 (×2): 0.5 mg via ORAL
  Filled 2013-07-16 (×2): qty 1

## 2013-07-16 MED ORDER — ENOXAPARIN SODIUM 40 MG/0.4ML ~~LOC~~ SOLN
40.0000 mg | SUBCUTANEOUS | Status: DC
  Administered 2013-07-16 – 2013-07-17 (×2): 40 mg via SUBCUTANEOUS
  Filled 2013-07-16 (×3): qty 0.4

## 2013-07-16 MED ORDER — ALPRAZOLAM 0.25 MG PO TABS: 0.2500 mg | ORAL_TABLET | Freq: Three times a day (TID) | ORAL | Status: DC | PRN

## 2013-07-16 MED ORDER — LIDOCAINE-PRILOCAINE 2.5-2.5 % EX CREA: TOPICAL_CREAM | CUTANEOUS | Status: DC | PRN

## 2013-07-16 MED ORDER — DEXAMETHASONE 4 MG PO TABS
4.0000 mg | ORAL_TABLET | Freq: Every day | ORAL | Status: DC
  Administered 2013-07-17 – 2013-07-18 (×2): 4 mg via ORAL
  Filled 2013-07-16 (×3): qty 1

## 2013-07-16 MED ORDER — MORPHINE (PF) INJECTION FOR INHALATION 10 MG/ML: 10.0000 mg | RESPIRATORY_TRACT | Status: DC | PRN

## 2013-07-16 MED ORDER — ONDANSETRON HCL 8 MG PO TABS: 8.0000 mg | ORAL_TABLET | Freq: Three times a day (TID) | ORAL | Status: DC | PRN

## 2013-07-16 MED ORDER — FLORA-Q PO CAPS
1.0000 | ORAL_CAPSULE | Freq: Every morning | ORAL | Status: DC
  Filled 2013-07-16 (×2): qty 1

## 2013-07-16 MED ORDER — METOPROLOL TARTRATE 50 MG PO TABS
50.0000 mg | ORAL_TABLET | Freq: Two times a day (BID) | ORAL | Status: DC
  Administered 2013-07-17 (×2): 50 mg via ORAL
  Filled 2013-07-16 (×5): qty 1

## 2013-07-16 MED ORDER — PROCHLORPERAZINE MALEATE 10 MG PO TABS: 5.0000 mg | ORAL_TABLET | Freq: Four times a day (QID) | ORAL | Status: DC | PRN

## 2013-07-16 NOTE — Progress Notes (Signed)
Patient ID: [name], female   DOB: 04/27/1955, 58 y.o.   MRN: [id number] Contacted by Dr. Marin Olp about placing bilateral chest PleurX catheters for recurrent pleural effusions.  Patient is being admitted.  Will make patient NPO after midnight and plan to discuss drain placements in the morning.

## 2013-07-16 NOTE — H&P (Signed)
#   [id number] is admit note.  Kimberly E.  Romans 8:28

## 2013-07-16 NOTE — H&P (Signed)
NAMEMISSY, BAKSH NO.:  [id number]  MEDICAL RECORD NO.:  [id number]  LOCATION:  30                         FACILITY:  [facility]  PHYSICIAN:  Volanda Napoleon, M.D.  DATE OF BIRTH:  August 20, 1955  DATE OF ADMISSION:  07/16/2013 DATE OF DISCHARGE:                             HISTORY & PHYSICAL   REASON FOR ADMISSION: 1. Shortness of breath secondary to recurrent pleural effusions. 2. 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  HISTORY OF PRESENT ILLNESS:  Ms. Deignan is a very charming 58 year old, white female.  She has history of metastatic lung cancer.  She has poorly differentiated adenocarcinoma.  She was diagnosed approximately 2 years ago.  She had a left lung mass.  She initially was felt to have an unknown primary.  However, we did molecular studies on the tumor.  The molecular studies showed that this was a primary lung cancer.  Shockingly enough, it was EGFR positive.  We initially placed her on oral therapy with Tarceva.  She had a short-term response to this.  We then placed her onto afatinib.  She had a transient response to this.  Then we had to finally get her on systemic chemotherapy.  She has been through several different chemotherapeutic regimens.  She recently has been treated with the new immunotherapy- Opdivo.  She has had 2 cycles.  Of note, she was found have a stage I breast cancer of the right breast.  This was a lobular carcinoma.  She has been on Aromasin.  This has caused some nice shrinkage.  We did not put her through any surgery for this, given her metastatic disease. She had a pleural effusion of the left lung about 10 days ago.  She had 950 mL of fluid removed.  She subsequently has had recurrence of this fluid.  She is now being admitted so that we can drain the fluid and have a PleurX catheter placed.  Her performance status is declining.  It is ECOG3.  She has a poor appetite.  She did have thrush when  we saw her last week. I put her on Diflucan.  She is not hurting and not having constipation.  There is no vomiting. She has had no headaches.  She has recently completed radiation to the brain for about 21 brain mets.  Shockingly, she has been asymptomatic with these.  Again, had to admit her because probably she was having shortness of breath and I just felt that she needed to be admitted so that we could get all these studies done quickly and try to get her home for the July 19, 2013, weekend.  PAST MEDICAL HISTORY:  Remarkable for the: 1. Stage IV lung cancer. 2. Stage I breast cancer in the right breast. 3. Hypertension.  ALLERGIES:  None.  MEDICATIONS:  Her home medications are documented in the medical record.  PHYSICAL EXAMINATION:  GENERAL:  This is a chronically ill-appearing, white female, in no obvious distress. VITAL SIGNS:  Temperature of 97.6, pulse 86, respiratory rate 24, blood pressure 147/93, oxygen saturation on 2 L is 95%. HEAD AND NECK:  Normocephalic, atraumatic skull.  There are no ocular or oral lesions.  She has no  palpable cervical or supraclavicular lymph nodes. LUNGS:  With decreased breath sounds bilaterally.  No wheezes are noted. CARDIAC:  Tachycardia, regular. ABDOMEN:  Soft.  She has good bowel sounds.  There is no fluid wave. There is no palpable liver or spleen tip. BACK:  No tenderness over the spine, ribs, or hips. EXTREMITIES:  Some muscle atrophy in upper and lower extremities.  She has no joint swelling.  There is no tenderness over her bones. NEUROLOGICAL:  Nonfocal.  Her labs were all pending.  IMPRESSION:  Ms. Lippman is a very charming 58 year old white female with metastatic lung cancer.  She is declining.  Her performance status is not that great.  Unfortunately, we have to admit her for these pleural effusions. I spoke with Radiology.  They will get her on the schedule for the catheter placement.  Hopefully, they can do  both in 1 day.  If not, then we will have one done tomorrow and one done on Friday.  I am going to have to talk to her about hospice.  I do not think that any further chemotherapy is going to benefit her.  I just feel that she is not responding.  I feel that we have to work on her quality of life.  We will also work on her code status.  I do not think that keeping her alive on machines would benefit her.  She has done incredibly well.  It has been 2 years since her initial diagnosis.  Again our focus is quality of life.  I am sure that she probably has protein-calorie malnutrition.  I will check a prealbumin on her.     Volanda Napoleon, M.D.     PRE/MEDQ  D:  07/16/2013  T:  07/16/2013  Job:  [id number]

## 2013-07-17 ENCOUNTER — Encounter: Payer: Self-pay | Admitting: *Deleted

## 2013-07-17 ENCOUNTER — Inpatient Hospital Stay (HOSPITAL_COMMUNITY): Payer: BC Managed Care – PPO

## 2013-07-17 DIAGNOSIS — R0609 Other forms of dyspnea: Secondary | ICD-10-CM

## 2013-07-17 DIAGNOSIS — R0989 Other specified symptoms and signs involving the circulatory and respiratory systems: Secondary | ICD-10-CM

## 2013-07-17 DIAGNOSIS — E43 Unspecified severe protein-calorie malnutrition: Secondary | ICD-10-CM | POA: Insufficient documentation

## 2013-07-17 LAB — PREALBUMIN: Prealbumin: 27.1 mg/dL (ref 17.0–34.0)

## 2013-07-17 MED ORDER — MIDAZOLAM HCL 2 MG/2ML IJ SOLN
INTRAMUSCULAR | Status: AC | PRN
  Administered 2013-07-17: 1 mg via INTRAVENOUS

## 2013-07-17 MED ORDER — CEFAZOLIN SODIUM-DEXTROSE 2-3 GM-% IV SOLR
INTRAVENOUS | Status: AC
  Filled 2013-07-17: qty 50

## 2013-07-17 MED ORDER — FENTANYL CITRATE 0.05 MG/ML IJ SOLN
INTRAMUSCULAR | Status: AC | PRN
  Administered 2013-07-17: 50 ug via INTRAVENOUS

## 2013-07-17 MED ORDER — NALOXONE HCL 0.4 MG/ML IJ SOLN
INTRAMUSCULAR | Status: AC
  Filled 2013-07-17: qty 1

## 2013-07-17 MED ORDER — MIDAZOLAM HCL 2 MG/2ML IJ SOLN
INTRAMUSCULAR | Status: AC
  Filled 2013-07-17: qty 2

## 2013-07-17 MED ORDER — LIDOCAINE HCL 1 % IJ SOLN
INTRAMUSCULAR | Status: AC
  Filled 2013-07-17: qty 20

## 2013-07-17 MED ORDER — FENTANYL CITRATE 0.05 MG/ML IJ SOLN
INTRAMUSCULAR | Status: AC
  Filled 2013-07-17: qty 2

## 2013-07-17 MED ORDER — HYDROCODONE-ACETAMINOPHEN 5-325 MG PO TABS
1.0000 | ORAL_TABLET | ORAL | Status: DC | PRN
  Administered 2013-07-18: 1 via ORAL
  Filled 2013-07-17: qty 2

## 2013-07-17 MED ORDER — CEFAZOLIN SODIUM-DEXTROSE 2-3 GM-% IV SOLR: 2.0000 g | INTRAVENOUS | Status: AC

## 2013-07-17 NOTE — Progress Notes (Signed)
Patient ID: [name], female   DOB: 08/13/1955, 58 y.o.   MRN: [id number] Request received for placement of bilateral pleurx catheters in pt with history of metastatic lung adenocarcinoma and recurrent symptomatic bilateral effusions. Additional PMH as below. Imaging studies were reviewed by Dr. Vernard Gambles. Exam: pt awake/alert; chest- dim BS bases; clean, intact rt chest wall PAC; heart- tachy but regular; abd- soft,+BS,NT; ext- FROM, trace edema.  Filed Vitals:   07/16/13 1836 07/16/13 2211 07/17/13 0628  BP: 147/93 155/85 158/88  Pulse: 86 84 107  Temp: 97.6 F (36.4 C) 97.4 F (36.3 C) 98 F (36.7 C)  TempSrc: Oral Oral Oral  Resp: 24 24 20   Height: 5\' 2"  (1.575 m)    Weight: 134 lb 3.2 oz (60.873 kg)    SpO2: 95% 96% 96%   Past Medical History  Diagnosis Date  . Pulmonary nodules 06/22/11    per CT scan  . IBS (irritable bowel syndrome)   . Fibroadenoma of breast     hx of  . Hx of radiation therapy 07/05/11 -07/21/11    LS, Tspine  . Hx of radiation therapy  completed7/3/13    brain, SRS  . Headache(784.0)   . Compression fracture     Multiple - Kyphoplasty  . Status post chemotherapy planned start 07/24/12    Alimta/carboplatin/Avastin / Prior chemotherapy Tarceva and Afatinib ,  . S/P radiation therapy 07/19/11     stereotactic radiosurgery to the right frontal, right thalamic, left frontal and left occipital target, 20 Gray in 1 fraction to each target  . Brain lesion 06/2712    New Lesions -3 mm, left mid cerebellum, 5 mm, left medial temporal lobe, 4 mm, right superior cerebellum, 3 mm, right anterior temporal lobe, 2 mm, left parietal cortex   . S/P radiation therapy  07/26/2012     20 Gy to Left temporal, Left cerebellar,Left posterior superior frontal, Right anterior temporal, Right superior vermian, Left occipital       . Hx of radiation therapy 10/21/12    SRS brain  . Shingles 01/02/13  . Postmenopausal HRT (hormone replacement therapy)     10/18/2005 - 12/2011  .  Metastasis of unknown origin   . Adenocarcinoma of lung Dx'd 06/2011  . 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 Breast cancer 01/04/12  . Metastatic lung cancer     Epidermal Growth Factor Positive - Afatinib  . Breast cancer 01/29/2013  . S/P radiation therapy  12/30/2012    Right Frontal and Right Temoporal lobes  . S/P radiation therapy 04/29/2013-05/14/2013    Sacral Lesion   Past Surgical History  Procedure Laterality Date  . Lung biopsy    . Vertebroplasty    . Vertebroplasty    . Breast biopsy  2013    right breast  . Intrauterine device insertion  10/2005    Mirena was removed 01/24/11   Dg Chest 1 View  07/07/2013   CLINICAL DATA:  Status post left thoracentesis.  EXAM: CHEST - 1 VIEW  COMPARISON:  07/07/2013 at 1038 hr  FINDINGS: Right jugular Port-A-Cath remains in place with tip overlying the mid upper SVC. Cardiac silhouette is partially obscured and may be mildly enlarged. There is a small left pleural effusion, significantly decreased in size compared to the examination from earlier today. There may be a small right pleural effusion, unchanged. Bilateral pulmonary nodules are again seen. There is improved aeration of the left lower lobe.  No pneumothorax is identified. Cement augmentation of multiple thoracolumbar compression fractures is again noted.  IMPRESSION: Small left pleural effusion, decreased in size following thoracentesis. No pneumothorax identified.   Electronically Signed   By: Logan Bores   On: 07/07/2013 13:54   Dg Chest 2 View  07/16/2013   CLINICAL DATA:  Shortness of breath, evaluate for fluid build-up, history of metastatic lung carcinoma post chemotherapy  EXAM: CHEST  2 VIEW  COMPARISON:  Chest x-ray of 07/11/2013 and CT chest of 06/12/2013  FINDINGS: The pleural effusions have increased somewhat in volume being moderately large with resultant bibasilar atelectasis. Pulmonary nodules are again noted  bilaterally consistent with diffuse pulmonary metastases. A right-sided Port-A-Cath remains and cardiomegaly is stable. Multiple thoracic and lumbar vertebroplasties are noted.  IMPRESSION: 1. Increase in volume of bilateral pleural effusions with increase in basilar atelectasis. 2. Diffuse bilateral lung metastases.   Electronically Signed   By: Ivar Drape M.D.   On: 07/16/2013 15:52   Dg Chest 2 View  07/11/2013   CLINICAL DATA:  evaluatet for pleural effusion recurrence on left lung  EXAM: CHEST  2 VIEW  COMPARISON:  Portable chest radiograph 07/07/2013, CT chest abdomen pelvis dated 06/12/2013.  FINDINGS: Low lung volumes. Stable small left pleural effusion. Right porta catheter tip projecting region superior vena cava. Multiple bilateral pulmonary nodules are appreciated stable. No new focal regions of consolidation or new focal infiltrates. Minimal blunting of the right costophrenic angle. No acute osseous abnormalities. Multilevel vertebral cement augmentation.  IMPRESSION: Stable small left pleural effusion. Minimal blunting right costophrenic angle may represent a trace effusion.  Multiple bilateral pulmonary opacities consistent with metastatic disease.   Electronically Signed   By: Margaree Mackintosh M.D.   On: 07/11/2013 13:24   Dg Chest 2 View  07/07/2013   CLINICAL DATA:  History of breast cancer and lung cancer. Shortness of breath for 2 weeks.  EXAM: CHEST  2 VIEW  COMPARISON:  Chest CT 06/12/2013  FINDINGS: Right jugular Port-A-Cath is present with tip overlying the mid to upper SVC. Cardiac silhouette is largely obscured. There is a moderate left pleural effusion, increased from the prior study. Dominant left lower lobe mass on CT is obscured by the pleural effusion and adjacent parenchymal consolidation/atelectasis. Multiple pulmonary nodules are seen throughout both lungs. There may be a small right pleural effusion. Sequelae of cement augmentation of multiple thoracic and lumbar spine  compression fractures are again identified.  IMPRESSION: 1. Moderate left pleural effusion, increased from prior CT, with associated left lower lobe atelectasis. Superimposed left lower lobe infection not excluded. 2. Bilateral pulmonary nodules, consistent with known metastatic disease.   Electronically Signed   By: Logan Bores   On: 07/07/2013 10:45   US Thoracentesis Asp Pleural Space W/img Guide  07/07/2013   CLINICAL DATA:  History of breast cancer and lung cancer ; bilateral pleural effusion, left larger than right  EXAM: ULTRASOUND GUIDED left THORACENTESIS  COMPARISON:  None.  PROCEDURE: An ultrasound guided thoracentesis was thoroughly discussed with the patient and questions answered. The benefits, risks, alternatives and complications were also discussed. The patient understands and wishes to proceed with the procedure. Written consent was obtained.  Ultrasound was performed to localize and mark an adequate pocket of fluid in the left chest. The area was then prepped and draped in the normal sterile fashion. 1% Lidocaine was used for local anesthesia. Under ultrasound guidance a 19 gauge Yueh catheter was introduced. Thoracentesis was performed. The catheter was removed  and a dressing applied.  Complications:  None  FINDINGS: A total of approximately 950 cc of greenish yellow fluid was removed. A fluid sample was notsent for laboratory analysis.  IMPRESSION: Successful ultrasound guided left thoracentesis yielding 950 cc of pleural fluid.  Read by: Jannifer Franklin [facility]   Electronically Signed   By: Daryll Brod M.D.   On: 07/07/2013 15:10  Results for orders placed during the hospital encounter of 07/16/13  APTT      Result Value Ref Range   aPTT 25  24 - 37 seconds  COMPREHENSIVE METABOLIC PANEL      Result Value Ref Range   Sodium 138  137 - 147 mEq/L   Potassium 4.8  3.7 - 5.3 mEq/L   Chloride 100  96 - 112 mEq/L   CO2 23  19 - 32 mEq/L   Glucose, Bld 118 (*) 70 - 99 mg/dL   BUN 51 (*) 6 -  23 mg/dL   Creatinine, Ser 1.55 (*) 0.50 - 1.10 mg/dL   Calcium 8.9  8.4 - 10.5 mg/dL   Total Protein 6.5  6.0 - 8.3 g/dL   Albumin 2.6 (*) 3.5 - 5.2 g/dL   AST 24  0 - 37 U/L   ALT 21  0 - 35 U/L   Alkaline Phosphatase 121 (*) 39 - 117 U/L   Total Bilirubin 0.4  0.3 - 1.2 mg/dL   GFR calc non Af Amer 36 (*) >90 mL/min   GFR calc Af Amer 41 (*) >90 mL/min   Anion gap 15  5 - 15  PROTIME-INR      Result Value Ref Range   Prothrombin Time 14.9  11.6 - 15.2 seconds   INR 1.17  0.00 - 1.49  CBC      Result Value Ref Range   WBC 13.8 (*) 4.0 - 10.5 K/uL   RBC 3.79 (*) 3.87 - 5.11 MIL/uL   Hemoglobin 11.6 (*) 12.0 - 15.0 g/dL   HCT 36.6  36.0 - 46.0 %   MCV 96.6  78.0 - 100.0 fL   MCH 30.6  26.0 - 34.0 pg   MCHC 31.7  30.0 - 36.0 g/dL   RDW 19.6 (*) 11.5 - 15.5 %   Platelets 200  150 - 400 K/uL   A/P: Pt with history of metastatic lung adenocarcinoma/progression of disease and recurrent symptomatic bilateral effusions. Plan is for right pleurx catheter placement today followed by tent left pleurx cath placement on 7/3. Details/risks of procedure d/w pt/husband with their understanding and consent.

## 2013-07-17 NOTE — Progress Notes (Signed)
Sent patient's Pleurx orders to [facility] on 07/17/13 at approx. 1400.

## 2013-07-17 NOTE — Progress Notes (Signed)
[name] had a decent night. She is on oxygen. She does not have any increased shortness of breath. She's not having pain. Hopefully, the Pleurx catheters will be placed today.  She's had no bleeding. She's had no fever.  I think some of physical therapy will help.  I did talk to her and her husband this morning about hospice. I would think hospice would be very helpful for her. I believe that we're at the point now where we need to focus on her quality of life and comfort. I do not anticipate any further chemotherapy for her. I believe she has had 5 different courses of therapy. She's progressing. Her performance status is declining. I told her the benefits of hospice. I told her that with hospice, she can have a better quality of life and also have a longer life. She agrees to hospice. I will make arrangements for this once she is discharged.  I have not yet talked CODE STATUS with her. I will probably do this tomorrow.  On her physical exam, her vital signs look stable. Blood pressure is 150/88. Pulse is 107. Temperature 90.8. Lungs show some decreased breath sounds at the bases. Cardiac exam tachycardia regular. Abdomen is soft. She's good bowel sounds. Extremities shows some chronic trace edema.  Again, and her disease is progressing. We will not plan any further chemotherapy. I think we just need to followup and make sure that her quality of life is our primary goal.  If all goes well, we should be do get her discharged tomorrow. If not, then at the latest on Saturday.  She really wants to be able to go home.  Pete E.  Phillipians 4:13

## 2013-07-17 NOTE — Procedures (Signed)
R Pleurx drain placed 1 L removed, sample for cyto No complication No blood loss. See complete dictation in [facility].

## 2013-07-17 NOTE — Progress Notes (Signed)
INITIAL NUTRITION ASSESSMENT  DOCUMENTATION CODES Per approved criteria  -Severe malnutrition in the context of chronic illness  Pt meets criteria for severe MALNUTRITION in the context of chronic illness as evidenced by 6.9% body weight loss in one month, PO intake <75% for > one month.   INTERVENTION: -Recommend Boost Plus BID as diet advancement tolerated -Will continue to montior  NUTRITION DIAGNOSIS: Inadequate oral intake related to taste changes/thrush as evidenced by PO intake <75%, 6.9% body weight loss in one month  Goal: Pt to meet >/= 90% of their estimated nutrition needs    Monitor:  Diet order, total protein/energy intake, labs, weights  Reason for Assessment: MST  58 y.o. female  Admitting Dx: <principal problem not specified>  ASSESSMENT:  -Pt NPO for right pleurx catheter placement and plan for left pleurx cath placement on 7/03 -Pt endorsed loss of appetite, loss of taste, difficulty w/textures and thrush that have contributed to prolonged decreased intake/ Decreased appetite has been occuring for over one year per previous RD assessments. -Has been educated previously on high calorie/high protein nutrition therapy. Pt reported consuming Boost Plus BID and will occasionally consume yogurt. [place] RD has provided pt supplement coupons in the mail. -Pt reported thrush and home oxygen recently contributing to decreased intake.  -Has ongoing weight loss. Pt has 10 lbs in past month per previous medical records, (6.9% body weight loss, severe for time frame)  Height: Ht Readings from Last 1 Encounters:  07/16/13 5\' 2"  (1.575 m)    Weight: Wt Readings from Last 1 Encounters:  07/16/13 134 lb 3.2 oz (60.873 kg)    Ideal Body Weight: 110 lbs  % Ideal Body Weight: 122%  Wt Readings from Last 10 Encounters:  07/16/13 134 lb 3.2 oz (60.873 kg)  07/02/13 151 lb (68.493 kg)  06/30/13 141 lb 4.8 oz (64.093 kg)  06/23/13 141 lb 11.2 oz (64.275 kg)  06/16/13  144 lb 9.6 oz (65.59 kg)  05/27/13 148 lb 4 oz (67.246 kg)  05/19/13 151 lb 4.8 oz (68.629 kg)  05/12/13 154 lb 9.6 oz (70.126 kg)  05/05/13 156 lb 1.6 oz (70.806 kg)  04/23/13 153 lb 4.8 oz (69.536 kg)    Usual Body Weight: 157 lbs  % Usual Body Weight: 85%  BMI:  Body mass index is 24.54 kg/(m^2).  Estimated Nutritional Needs: Kcal: 1900-2100 Protein: 85-100 gram Fluid: >/=1900 ml/daily  Skin: WDL  Diet Order: NPO  EDUCATION NEEDS: -No education needs identified at this time   Intake/Output Summary (Last 24 hours) at 07/17/13 1124 Last data filed at 07/17/13 0625  Gross per 24 hour  Intake    100 ml  Output    400 ml  Net   -300 ml    Last BM: pta   Labs:   Recent Labs Lab 07/11/13 1138 07/16/13 1840  NA 139 138  K 3.9 4.8  CL 100 100  CO2 28 23  BUN 25* 51*  CREATININE 1.3* 1.55*  CALCIUM 8.8 8.9  GLUCOSE 80 118*    CBG (last 3)  No results found for this basename: GLUCAP,  in the last 72 hours  Scheduled Meds: . antiseptic oral rinse  15 mL Mouth Rinse BID  . clindamycin   Topical BID  . dexamethasone  4 mg Oral Daily  . dronabinol  2.5 mg Oral BID AC  . enoxaparin (LOVENOX) injection  40 mg Subcutaneous Q24H  . FLORA-Q  1 capsule Oral q morning - 10a  . fluconazole  100 mg Oral Daily  . LORazepam  0.5 mg Oral QHS  . methylphenidate  10 mg Oral BID WC  . metoprolol  50 mg Oral BID    Continuous Infusions:   Past Medical History  Diagnosis Date  . Pulmonary nodules 06/22/11    per CT scan  . IBS (irritable bowel syndrome)   . Fibroadenoma of breast     hx of  . Hx of radiation therapy 07/05/11 -07/21/11    LS, Tspine  . Hx of radiation therapy  completed7/3/13    brain, SRS  . Headache(784.0)   . Compression fracture     Multiple - Kyphoplasty  . Status post chemotherapy planned start 07/24/12    Alimta/carboplatin/Avastin / Prior chemotherapy Tarceva and Afatinib ,  . S/P radiation therapy 07/19/11     stereotactic radiosurgery to  the right frontal, right thalamic, left frontal and left occipital target, 20 Gray in 1 fraction to each target  . Brain lesion 06/2712    New Lesions -3 mm, left mid cerebellum, 5 mm, left medial temporal lobe, 4 mm, right superior cerebellum, 3 mm, right anterior temporal lobe, 2 mm, left parietal cortex   . S/P radiation therapy  07/26/2012     20 Gy to Left temporal, Left cerebellar,Left posterior superior frontal, Right anterior temporal, Right superior vermian, Left occipital       . Hx of radiation therapy 10/21/12    SRS brain  . Shingles 01/02/13  . Postmenopausal HRT (hormone replacement therapy)     10/18/2005 - 12/2011  . Metastasis of unknown origin   . Adenocarcinoma of lung Dx'd 06/2011  . 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 Breast cancer 01/04/12  . Metastatic lung cancer     Epidermal Growth Factor Positive - Afatinib  . Breast cancer 01/29/2013  . S/P radiation therapy  12/30/2012    Right Frontal and Right Temoporal lobes  . S/P radiation therapy 04/29/2013-05/14/2013    Sacral Lesion    Past Surgical History  Procedure Laterality Date  . Lung biopsy    . Vertebroplasty    . Vertebroplasty    . Breast biopsy  2013    right breast  . Intrauterine device insertion  10/2005    Mirena was removed 01/24/11    Atlee Abide MS RD LDN Clinical Dietitian [phone]'

## 2013-07-17 NOTE — Sedation Documentation (Signed)
Ancef given prior to procedure

## 2013-07-17 NOTE — Progress Notes (Signed)
Pt was in bed; husband bedside during visit. Pt said she was having a procedure and during visit asked for prayer. Pt's husband was very supportive and joined [place]. Pt thankful for prayer and visit. Ernest Haber Chaplain  07/17/13 1400  Clinical Encounter Type  Visited With Patient and family together

## 2013-07-18 ENCOUNTER — Inpatient Hospital Stay (HOSPITAL_COMMUNITY): Payer: BC Managed Care – PPO

## 2013-07-18 LAB — CBC
HEMATOCRIT: 34.3 % — AB (ref 36.0–46.0)
Hemoglobin: 11 g/dL — ABNORMAL LOW (ref 12.0–15.0)
MCH: 31.1 pg (ref 26.0–34.0)
MCHC: 32.1 g/dL (ref 30.0–36.0)
MCV: 96.9 fL (ref 78.0–100.0)
Platelets: 189 10*3/uL (ref 150–400)
RBC: 3.54 MIL/uL — ABNORMAL LOW (ref 3.87–5.11)
RDW: 20.2 % — ABNORMAL HIGH (ref 11.5–15.5)
WBC: 15.8 10*3/uL — AB (ref 4.0–10.5)

## 2013-07-18 MED ORDER — MIDAZOLAM HCL 2 MG/2ML IJ SOLN
INTRAMUSCULAR | Status: AC | PRN
  Administered 2013-07-18 (×2): 1 mg via INTRAVENOUS

## 2013-07-18 MED ORDER — FENTANYL CITRATE 0.05 MG/ML IJ SOLN
INTRAMUSCULAR | Status: AC | PRN
  Administered 2013-07-18 (×2): 50 ug via INTRAVENOUS

## 2013-07-18 MED ORDER — MIDAZOLAM HCL 2 MG/2ML IJ SOLN
INTRAMUSCULAR | Status: AC
  Filled 2013-07-18: qty 2

## 2013-07-18 MED ORDER — LIDOCAINE HCL 1 % IJ SOLN
INTRAMUSCULAR | Status: AC
  Filled 2013-07-18: qty 20

## 2013-07-18 MED ORDER — LIDOCAINE-EPINEPHRINE (PF) 2 %-1:200000 IJ SOLN
INTRAMUSCULAR | Status: AC
  Filled 2013-07-18: qty 20

## 2013-07-18 MED ORDER — CEFAZOLIN SODIUM-DEXTROSE 2-3 GM-% IV SOLR
INTRAVENOUS | Status: AC
  Administered 2013-07-18: 11:00:00
  Filled 2013-07-18: qty 50

## 2013-07-18 MED ORDER — FENTANYL CITRATE 0.05 MG/ML IJ SOLN
INTRAMUSCULAR | Status: AC
  Filled 2013-07-18: qty 2

## 2013-07-18 MED ORDER — HEPARIN SOD (PORK) LOCK FLUSH 100 UNIT/ML IV SOLN
500.0000 [IU] | INTRAVENOUS | Status: AC | PRN
  Administered 2013-07-18: 500 [IU]
  Filled 2013-07-18: qty 5

## 2013-07-18 NOTE — Progress Notes (Signed)
Talked to pt/spouse about Hospice choices, pt/spouse chose [facility]; Danton Sewer RN with Hospice called - will see pt today; Aneta Mins [phone]

## 2013-07-18 NOTE — Progress Notes (Signed)
Notified by Moshe Salisbury, patient and family request services [facility]) after discharge.  Spoke with husband, Dalbert Mayotte, at bedside, only able to introduce self to patient, who was on her way to IR to have L pleurex catheter placed; R pleurex catheter was placed yesterday 07/17/13 with 1Liter fluid removed.  Discussion separately with husband to initiate education related to hospice services, philosophy and team approach to care- he voiced good understanding of information provided.  Noted pt code status was changed this morning to DNR by Dr Marin Olp; husband confirms this decision and stated they have completed Advanced Directives in the home - Please send [place] DNR Form home with pt if completed Per notes and discussion plan is to d/c home today by personal vehicle;  [facility] Assessment nurse will see pt on Saturday 07/19/13; husband aware and agreeable. Writer discussed with staff RN Chrys Racer question of Pleurex catheter supplies going home with patient.  DME needs discussed per husband pt has O2, walker and BSC currently in the home; he declines any additional equipment needs at this time  Initial paperwork faxed to [place] [facility]   Completed d/c summary will need to be faxed to [facility] @ [phone] when final Please notify [facility] when patient is ready to leave unit at d/c call [phone] (or [phone] if after 5 pm);  [facility] information and contact numbers also given to husband during visit.   Above information shared with [facility] Hassan Rowan Please call with any questions or concerns   Danton Sewer, RN 07/18/2013, 11:26 AM [facility] [phone]

## 2013-07-18 NOTE — Procedures (Signed)
Placement of left pleural drain without complication.  Removed 700 ml of yellow fluid.  Sample sent for cytology.

## 2013-07-18 NOTE — Discharge Summary (Signed)
[name], [name] NO.:  [id number]  MEDICAL RECORD NO.:  [id number]  LOCATION:  2426                         FACILITY:  [facility]  PHYSICIAN:  Volanda Napoleon, M.D.  DATE OF BIRTH:  Mar 15, 1955  DATE OF ADMISSION:  07/16/2013 DATE OF DISCHARGE:  07/18/2013                              DISCHARGE SUMMARY   DIAGNOSES UPON DISCHARGE: 1. 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 2. Placement of bilateral PleurX catheter for pleural effusion     drainage. 3. Protein-calorie malnutrition. 4. Dyspnea secondary to pleural effusion. 5. Renal insufficiency.  CONDITION UPON DISCHARGE:  Fair.  FOLLOWUP: 1. The patient will be followed by hospice at home. 2. The patient will see Dr. Marin Olp at the [facility] in about 1 week.  Her diet upon discharge is as tolerated.  MEDICATIONS UPON DISCHARGE: 1. Xanax 0.25-0.5 mg p.o. q.6 hours p.r.n. anxiety. 2. Clindamycin lotion to be applied to the face twice a day. 3. Decadron 4 mg p.o. b.i.d. with food. 4. Marinol 2.5 mg p.o. t.i.d. 5. Diflucan 100 mg p.o. daily. 6. Norco (10/325) 1 p.o. q.6 hours p.r.n. 7. Imodium 0.2 mg p.o. q.4 hours p.r.n. diarrhea. 8. Ativan 0.5 mg p.o. at bedtime p.r.n. 9. Ritalin 10 mg p.o. b.i.d. 10.Lopressor 50 mg p.o. b.i.d. 11.Zofran 8 mg p.o. q.8 hours p.r.n. nausea and vomiting. 12.Compazine 10 mg p.o. q.6 hours p.r.n. nausea and vomiting.  HOSPITAL COURSE:  Ms. Connery was admitted from the office.  She has shortness of breath.  She had recurrent pleural effusions.  She has metastatic adenocarcinoma of the lung.  She is declining in her performance status.  She has been on treatment, but this clearly has not been affective.  We admitted her.  She had oxygen placed.  Labs on admission looked okay.  She has some mild renal insufficiency. Her prealbumin was 27.1.  We got Radiology involved.  They helped out incredibly,  efficiently.  On July 17, 2013, she had a PleurX catheter placed into the right pleural space.  1000 mL of fluid were removed.  This helped her breathing.  She is to have a second PleurX catheter placed on the day of discharge in the left side.  She is doing okay.  Her appetite is not all that good.  She cannot really because of her procedures.  I did talk to her about hospice.  Her husband was with [place].  They hospice would be a great idea for her.  I think she really will need the help at home.  I think hospice will be very key in allowing her to stay at home and try to be functional.  She and her husband both think hospice would be a good idea.  We will get case manager to help make that referral.  I also talked to Ms. Oser about code status.  I explained to her end of life issues.  She does not want to be kept alive on machines.  She wants respect and dignity.  We will certainly give her this.  I told her that we would still be aggressive in managing her symptoms, so that  she will not have difficulties.  As such, she is a do not resuscitate.  Again, we will get hospice to come out.  They will help with the PleurX catheter drainage procedure.  We will not have any further therapy for Ms. Montour.  She is progressed.  She has been on several different lines of therapy and has done well.  It has been 2 years since her initial diagnosis.  Upon discharge, her vital signs showed temperature of 97.9.  Pulse 79. Blood pressure 124/80.  Oxygen saturation is 98%.  Respiratory rate was 18.  Her lungs showed some marked improvement of breath sounds on the right side.  Left side had decent breath sounds, but decreased about half of.  Cardiac exam:  Regular rate and rhythm.  No murmurs were noted.  Oral exam shows no thrush.  Abdomen is soft.  She has good bowel sounds.  There is no palpable liver or spleen tip.  Skin exam shows no rashes.  Neurological exam is nonfocal.  Upon  discharge, her white cell count is 15.8, hemoglobin 11, and platelet count 189.     Volanda Napoleon, M.D.     PRE/MEDQ  D:  07/18/2013  T:  07/18/2013  Job:  [id number]

## 2013-07-18 NOTE — Discharge Summary (Signed)
#   [id number] is d/c summary.  Pete E.

## 2013-07-18 NOTE — Evaluation (Signed)
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  Clinical Impression  Pt did  Get up with RW but  Had to cut short due to goung to procedure. Pt will benefit from PT while in acute care. Not certain how much  Activity she will be able to tolerate.    Follow Up Recommendations No PT follow up (pt is going home with Hospice)    Equipment Recommendations  None recommended by PT    Recommendations for Other Services       Precautions / Restrictions Precautions Precautions: Fall Precaution Comments: R Pleurex drain      Mobility  Bed Mobility Overal bed mobility: Needs Assistance Bed Mobility: Rolling;Sidelying to Sit Rolling: Min assist Sidelying to sit: Mod assist       General bed mobility comments: assist with trunk to get to upright sitting., extra time  Transfers Overall transfer level: Needs assistance Equipment used: Rolling walker (2 wheeled) Transfers: Sit to/from Stand Sit to Stand: Min assist         General transfer comment: cues for safety, extra time  Ambulation/Gait Ambulation/Gait assistance: Min assist Ambulation Distance (Feet): 8 Feet Assistive device: Rolling walker (2 wheeled) Gait Pattern/deviations: Step-through pattern Gait velocity: decr.   General Gait Details: cues for safety, posture  Stairs            Wheelchair Mobility    Modified Rankin (Stroke Patients Only)       Balance Overall balance assessment: Needs assistance Sitting-balance support: Bilateral upper extremity supported;Feet supported Sitting balance-Leahy Scale: Fair     Standing balance support: During functional activity;Bilateral upper extremity supported Standing balance-Leahy Scale: Fair                                Pertinent Vitals/Pain On 2 l.no SOB noted.    Home Living Family/patient expects to be discharged to:: Private residence   Available Help at Discharge: Family Type of Home: House Home Access: Stairs to enter Entrance Stairs-Rails: None (can hold onto objects beside the steps) Entrance Stairs-Number of Steps: 2 Home Layout: One level Home Equipment: [place] - 2 wheels;Bedside commode      Prior Function Level of Independence: Needs assistance               Hand Dominance        Extremity/Trunk Assessment   Upper Extremity Assessment: Generalized weakness           Lower Extremity Assessment: Generalized weakness         Communication   Communication: No difficulties  Cognition Arousal/Alertness: Awake/alert Behavior During Therapy: WFL for tasks assessed/performed Overall Cognitive Status:  (slow to respond)                      General Comments      Exercises        Assessment/Plan    PT Assessment Patient needs continued PT services  PT Diagnosis Difficulty walking   PT Problem List Decreased strength;Decreased activity tolerance;Decreased mobility;Decreased knowledge of precautions;Decreased safety awareness;Decreased knowledge of use of DME  PT Treatment Interventions DME instruction;Gait training;Functional mobility training;Patient/family education   PT Goals (Current goals can be found in the Care Plan section) Acute Rehab PT Goals Patient Stated  Goal: agreed to get up PT Goal Formulation: With patient/family Time For Goal Achievement: 08/01/13 Potential to Achieve Goals: Good    Frequency Min 3X/week   Barriers to discharge        Co-evaluation               End of Session   Activity Tolerance: Patient limited by fatigue (limited by need to go to radiology) Patient left: in bed;with call bell/phone within reach;with family/visitor present;with nursing/sitter in room Nurse Communication: Mobility status          Time: 1117-3567 PT Time Calculation (min): 13 min   Charges:   PT Evaluation $Initial PT Evaluation Tier I: 1 Procedure     PT G CodesClaretha Cooper 07/18/2013, 10:35 AM Tresa Endo PT [phone]

## 2013-07-21 ENCOUNTER — Encounter: Payer: Self-pay | Admitting: *Deleted

## 2013-07-21 NOTE — Progress Notes (Signed)
Received phone call from [place] at Hospice that the pt fell yesterday, but only has a small abrasion on her right elbow.  Nurse also stated that her Pleurx was drained yesterday. 250cc was drained from the left pleurx that was bright red in color. 400 cc was drained from the right pleurx that was pink in color.  Dr Marin Olp aware of the patients fall and her Pleurx.  I informed the Hospice nurse, Lexa, that the doctor wants the pleurx drained every three days.

## 2013-07-22 ENCOUNTER — Other Ambulatory Visit: Payer: Self-pay | Admitting: Hematology & Oncology

## 2013-07-25 ENCOUNTER — Ambulatory Visit (HOSPITAL_BASED_OUTPATIENT_CLINIC_OR_DEPARTMENT_OTHER): Payer: BC Managed Care – PPO | Admitting: Hematology & Oncology

## 2013-07-25 ENCOUNTER — Encounter: Payer: Self-pay | Admitting: Hematology & Oncology

## 2013-07-25 ENCOUNTER — Other Ambulatory Visit (HOSPITAL_BASED_OUTPATIENT_CLINIC_OR_DEPARTMENT_OTHER): Payer: BC Managed Care – PPO | Admitting: Lab

## 2013-07-25 ENCOUNTER — Other Ambulatory Visit: Payer: BC Managed Care – PPO | Admitting: Lab

## 2013-07-25 ENCOUNTER — Ambulatory Visit (HOSPITAL_BASED_OUTPATIENT_CLINIC_OR_DEPARTMENT_OTHER): Payer: BC Managed Care – PPO

## 2013-07-25 ENCOUNTER — Ambulatory Visit: Payer: BC Managed Care – PPO

## 2013-07-25 ENCOUNTER — Ambulatory Visit: Payer: BC Managed Care – PPO | Admitting: Hematology & Oncology

## 2013-07-25 VITALS — BP 117/71 | HR 104 | Temp 98.0°F | Resp 16 | Ht 62.0 in

## 2013-07-25 DIAGNOSIS — C7952 Secondary malignant neoplasm of bone marrow: Secondary | ICD-10-CM

## 2013-07-25 DIAGNOSIS — R5381 Other malaise: Secondary | ICD-10-CM

## 2013-07-25 DIAGNOSIS — R531 Weakness: Secondary | ICD-10-CM

## 2013-07-25 DIAGNOSIS — R5383 Other fatigue: Secondary | ICD-10-CM

## 2013-07-25 DIAGNOSIS — J90 Pleural effusion, not elsewhere classified: Secondary | ICD-10-CM

## 2013-07-25 DIAGNOSIS — C7951 Secondary malignant neoplasm of bone: Secondary | ICD-10-CM

## 2013-07-25 DIAGNOSIS — C50919 Malignant neoplasm of unspecified site of unspecified female breast: Secondary | ICD-10-CM

## 2013-07-25 DIAGNOSIS — C7931 Secondary malignant neoplasm of brain: Secondary | ICD-10-CM

## 2013-07-25 DIAGNOSIS — C3490 Malignant neoplasm of unspecified part of unspecified bronchus or lung: Secondary | ICD-10-CM

## 2013-07-25 DIAGNOSIS — C3430 Malignant neoplasm of lower lobe, unspecified bronchus or lung: Secondary | ICD-10-CM

## 2013-07-25 DIAGNOSIS — C7949 Secondary malignant neoplasm of other parts of nervous system: Secondary | ICD-10-CM

## 2013-07-25 DIAGNOSIS — C3492 Malignant neoplasm of unspecified part of left bronchus or lung: Secondary | ICD-10-CM

## 2013-07-25 LAB — CBC WITH DIFFERENTIAL (CANCER CENTER ONLY)
BASO#: 0 10*3/uL (ref 0.0–0.2)
BASO%: 0.2 % (ref 0.0–2.0)
EOS%: 0.1 % (ref 0.0–7.0)
Eosinophils Absolute: 0 10*3/uL (ref 0.0–0.5)
HCT: 31.1 % — ABNORMAL LOW (ref 34.8–46.6)
HEMOGLOBIN: 10.2 g/dL — AB (ref 11.6–15.9)
LYMPH#: 0.1 10*3/uL — AB (ref 0.9–3.3)
LYMPH%: 1.1 % — ABNORMAL LOW (ref 14.0–48.0)
MCH: 31.7 pg (ref 26.0–34.0)
MCHC: 32.8 g/dL (ref 32.0–36.0)
MCV: 97 fL (ref 81–101)
MONO#: 0.6 10*3/uL (ref 0.1–0.9)
MONO%: 5 % (ref 0.0–13.0)
NEUT#: 11.8 10*3/uL — ABNORMAL HIGH (ref 1.5–6.5)
NEUT%: 93.6 % — ABNORMAL HIGH (ref 39.6–80.0)
PLATELETS: 143 10*3/uL — AB (ref 145–400)
RBC: 3.22 10*6/uL — ABNORMAL LOW (ref 3.70–5.32)
RDW: 18.5 % — ABNORMAL HIGH (ref 11.1–15.7)
WBC: 12.6 10*3/uL — AB (ref 3.9–10.0)

## 2013-07-25 LAB — LACTATE DEHYDROGENASE: LDH: 370 U/L — ABNORMAL HIGH (ref 94–250)

## 2013-07-25 LAB — HOLD TUBE, BLOOD BANK - CHCC SATELLITE

## 2013-07-25 LAB — CMP (CANCER CENTER ONLY)
ALBUMIN: 2.3 g/dL — AB (ref 3.3–5.5)
ALK PHOS: 269 U/L — AB (ref 26–84)
ALT(SGPT): 49 U/L — ABNORMAL HIGH (ref 10–47)
AST: 38 U/L (ref 11–38)
BUN, Bld: 40 mg/dL — ABNORMAL HIGH (ref 7–22)
CHLORIDE: 94 meq/L — AB (ref 98–108)
CO2: 27 mEq/L (ref 18–33)
CREATININE: 1.2 mg/dL (ref 0.6–1.2)
Calcium: 8.1 mg/dL (ref 8.0–10.3)
Glucose, Bld: 111 mg/dL (ref 73–118)
POTASSIUM: 4.5 meq/L (ref 3.3–4.7)
Sodium: 138 mEq/L (ref 128–145)
Total Bilirubin: 0.6 mg/dl (ref 0.20–1.60)
Total Protein: 6.1 g/dL — ABNORMAL LOW (ref 6.4–8.1)

## 2013-07-25 LAB — PREALBUMIN: PREALBUMIN: 28.3 mg/dL (ref 17.0–34.0)

## 2013-07-25 MED ORDER — SODIUM CHLORIDE 0.9 % IV SOLN
1000.0000 mL | Freq: Once | INTRAVENOUS | Status: AC
  Administered 2013-07-25: 1000 mL via INTRAVENOUS

## 2013-07-25 MED ORDER — MAGIC MOUTHWASH: 10.0000 mL | Freq: Four times a day (QID) | ORAL | Status: AC

## 2013-07-25 MED ORDER — HEPARIN SOD (PORK) LOCK FLUSH 100 UNIT/ML IV SOLN
500.0000 [IU] | Freq: Once | INTRAVENOUS | Status: AC
  Administered 2013-07-25: 500 [IU] via INTRAVENOUS
  Filled 2013-07-25: qty 5

## 2013-07-25 MED ORDER — SODIUM CHLORIDE 0.9 % IJ SOLN
10.0000 mL | INTRAMUSCULAR | Status: DC | PRN
  Administered 2013-07-25: 10 mL via INTRAVENOUS
  Filled 2013-07-25: qty 10

## 2013-07-25 NOTE — Patient Instructions (Signed)

## 2013-07-25 NOTE — Progress Notes (Signed)
Hematology and Oncology Follow Up Visit  Alysiah R Puder [id number] 06/15/1955 58 y.o. 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  Boza is in for followup. We saw her last week  To admit her because of bilateral pleural effusion recurrent. She had chest tubes placed bilaterally.  Cytology on the fluid was positive for adenocarcinoma in both lungs.  She had been on Opdivo. She had 2 cycles. Clearly, this was not working.  I talked to she and her husband in the hospital. I told him what was going on. I told them that the cancer was progressing despite all therapy. We really do not have any other options.  I did speak with pathology. I had them run the fluid with genetic testing to see if there is any possible that mutations that we could conceivably use for therapy. I think the odds of she having one would be one of 50.  Hospice is now seeing her. They are doing a good job.  She has no appetite. She has no taste for food. She is on Marinol. I did give her a prescription for Magic mouthwash to help.  She really cannot walk. She is just too weak.  Her performance status is ECOG 3-4. Medications: Current outpatient prescriptions:ALPRAZolam (XANAX) 0.25 MG tablet, Take 0.25-0.5 mg by mouth as needed. For anxiety., Disp: , Rfl: ;  Alum & Mag Hydroxide-Simeth (MAGIC MOUTHWASH) SOLN, Take 10 mLs by mouth 4 (four) times daily., Disp: 500 mL, Rfl: 3;  dexamethasone (DECADRON) 4 MG tablet, Take 1 tablet (4 mg total) by mouth 2 (two) times daily with a meal., Disp: 60 tablet, Rfl: 2 dronabinol (MARINOL) 2.5 MG capsule, Take 1 capsule (2.5 mg total) by mouth 2 (two) times daily before a meal., Disp: 60 capsule, Rfl: 0;  fluconazole (DIFLUCAN) 100 MG tablet, Take 1 tablet (100 mg total) by mouth daily., Disp: 30 tablet, Rfl: 3;  folic acid (FOLVITE) 1 MG tablet, TAKE 1 TABLET  BY MOUTH DAILY START 5 DAYS BEFORE CHEMO AND CONTINUE 21 DAYS AFTER, Disp: 100 tablet, Rfl: 3 HYDROcodone-acetaminophen (NORCO) 10-325 MG per tablet, Take 1 tablet by mouth every 6 (six) hours as needed., Disp: 120 tablet, Rfl: 0;  lidocaine-prilocaine (EMLA) cream, Apply topically as needed (port access). , Disp: , Rfl: ;  loperamide (IMODIUM) 2 MG capsule, Take 2 mg by mouth 4 (four) times daily as needed. For diarrhea, Disp: , Rfl: ;  LORazepam (ATIVAN) 0.5 MG tablet, Take 0.5 mg by mouth at bedtime. , Disp: , Rfl:  methylphenidate (RITALIN) 10 MG tablet, Take 10 mg by mouth 2 (two) times daily with breakfast and lunch. Take 1 pill TWICE a day., Disp: , Rfl: ;  metoprolol (LOPRESSOR) 50 MG tablet, Take 1 tablet (50 mg total) by mouth 2 (two) times daily., Disp: 60 tablet, Rfl: 3;  ondansetron (ZOFRAN) 8 MG tablet, Take 1 tablet (8 mg total) by mouth every 8 (eight) hours as needed for nausea or vomiting., Disp: 20 tablet, Rfl: 5 prochlorperazine (COMPAZINE) 10 MG tablet, TAKE 1 TABLET (10 MG TOTAL) BY MOUTH EVERY 6 (SIX) HOURS AS NEEDED FOR NAUSEA OR VOMITING., Disp: 30 tablet, Rfl: 1 No current facility-administered medications for this visit. Facility-Administered Medications Ordered in Other Visits: sodium chloride 0.9 % injection 10 mL, 10 mL, Intracatheter, PRN, Peter R Ennever, MD, 10 mL at 09/04/12 1511;  sodium chloride 0.9 %   injection 10 mL, 10 mL, Intravenous, PRN, Volanda Napoleon, MD, 10 mL at 07/02/13 1529;  sodium chloride 0.9 % injection 10 mL, 10 mL, Intravenous, PRN, Volanda Napoleon, MD, 10 mL at 07/25/13 1146 topical emolient (BIAFINE) emulsion, , Topical, BID, Eppie Gibson, MD  Allergies: No Known Allergies  Past Medical History, Surgical history, Social history, and Family History were reviewed and updated.  Review of Systems: As above  Physical Exam:  height is 5' 2" (1.575 m). Her oral temperature is 98 F (36.7 C). Her blood pressure is 117/71 and her pulse is 104. Her  respiration is 16.   Frail appearing white female. Her head and neck exam shows no thrush. No adenopathy noted in the neck. Lungs are with good breath sounds bilaterally. Cardiac exam tachycardic but regular. Abdomen is soft. There is no fluid wave. She has good bowel sounds. There is no palpable liver or spleen tip. Extremities shows some chronic swelling of the left lower leg. No venous cords noted. She has 2/5 strength in her legs. Skin exam no rashes. Neurological exam is nonfocal.  Lab Results  Component Value Date   WBC 12.6* 07/25/2013   HGB 10.2* 07/25/2013   HCT 31.1* 07/25/2013   MCV 97 07/25/2013   PLT 143* 07/25/2013     Chemistry      Component Value Date/Time   NA 138 07/25/2013 0815   NA 138 07/16/2013 1840   NA 135* 07/01/2013 1348   K 4.5 07/25/2013 0815   K 4.8 07/16/2013 1840   K 5.0 07/01/2013 1348   CL 94* 07/25/2013 0815   CL 100 07/16/2013 1840   CO2 27 07/25/2013 0815   CO2 23 07/16/2013 1840   CO2 24 07/01/2013 1348   BUN 40* 07/25/2013 0815   BUN 51* 07/16/2013 1840   BUN 26.2* 07/01/2013 1348   CREATININE 1.2 07/25/2013 0815   CREATININE 1.55* 07/16/2013 1840   CREATININE 2.3* 07/01/2013 1348      Component Value Date/Time   CALCIUM 8.1 07/25/2013 0815   CALCIUM 8.9 07/16/2013 1840   CALCIUM 9.5 07/01/2013 1348   ALKPHOS 269* 07/25/2013 0815   ALKPHOS 121* 07/16/2013 1840   ALKPHOS 122 07/01/2013 1348   AST 38 07/25/2013 0815   AST 24 07/16/2013 1840   AST 24 07/01/2013 1348   ALT 49* 07/25/2013 0815   ALT 21 07/16/2013 1840   ALT 10 07/01/2013 1348   BILITOT 0.60 07/25/2013 0815   BILITOT 0.4 07/16/2013 1840   BILITOT 0.28 07/01/2013 1348         Impression and Plan: Ms. Dampier is 57 year old female with metastatic poorly differentiated carcinoma. Is now been 2 years since initial diagnosis.  I really think that she really is not going to make it through August. I told her husband this.  In the hospital, I talked to her about her desire for life support. She does not want to  be kept alive on a machine. She wants respect and dignity. I agree with this. As such, she is DO NOT RESUSCITATE.  Am not sure how much longer she'll be able to stay at home. Her husband is doing his best. He just had knee surgery. It is tough for him to try to help but he is really doing a great job in assisting as much as possible.  I told them that her weight will dictate how long she has. I think she will continue to lose weight. As she loses weight, she will continue  to weekend and decreased even further her caloric intake.  I am not sure we will be able to get her back to the office. Again hospice is doing a great job. It would not surprise me if she ends up in [facility] within 2 weeks.  Again, she has done incredibly well. I just 8 that she still relatively young.  I spent a good hour with them today.   ENNEVER,PETER R, MD 7/10/201512:20 PM 

## 2013-07-29 ENCOUNTER — Encounter: Payer: Self-pay | Admitting: Radiation Oncology

## 2013-07-29 NOTE — Progress Notes (Signed)
Radiation Oncology         [phone] ________________________________  Name: [name] MRN: [id number]  Date: 07/30/2013  DOB: August 25, 1955  Follow-Up Visit Note  outpatient  CC: Kimberly Melter, MD  Kimberly Napoleon, MD  Diagnosis and Prior Radiotherapy:  Brain Metastases, Adenocarcinoma of lung  Indication for treatment: palliative  Radiation treatment dates: 06/16/2013-07/03/2013  Site/dose: Whole brain / 35 Gy in 14 fractions  On 05-14-13 she completed 30 Gy in 12 fractions to the Right S2 mass.   12/30/2012  Right frontal 3.8 mm target was treated using 3 circular cone Arcs to a prescription dose of 20 Gy.  Right temporal 2.7 mm target was treated using 3 circular cone Arcs to a prescription dose of 20 Gy.   07-26-12 She completed stereotactic radiosurgery to the Left temporal 47mm target, Left cerebellar 3mm target , Left posterior superior frontal 58mm target, Right anterior temporal 85mm target , Right superior vermian 27mm target , Left occipital 65mm target, all to 20 Gy.  07-19-11 She completed stereotactic radiosurgery to the right frontal, right thalamic, left frontal and left occipital target, 20 Gray in 1 fraction to each target  07/05/2011-07/21/2011 - LS spine and thoracic spine (T7-T9) 30 Gy in 10 sessions    Narrative:  The patient returns today for routine follow-up.  She was recently admitted for bilateral pleural effusions that yielded adenocarcinoma. [name] withdrew systemic therapy due to her progressive disease.  Hospice is now visiting her at home, and her husband, who is here today, is very involved in her care.  She is very weak with poor appetite.   She hesitates to take her narcotics for pain due to fear of constipation. No severe headaches.Takes 1 senna tablet daily, and has firm BMs q 3 days.                       ALLERGIES:  has No Known Allergies.  Meds: Current Outpatient Prescriptions  Medication Sig Dispense Refill  . ALPRAZolam (XANAX) 0.25  MG tablet Take 0.25-0.5 mg by mouth as needed. For anxiety.      . Alum & Mag Hydroxide-Simeth (MAGIC MOUTHWASH) SOLN Take 10 mLs by mouth 4 (four) times daily.  500 mL  3  . dexamethasone (DECADRON) 4 MG tablet Take 1 tablet (4 mg total) by mouth 2 (two) times daily with a meal.  60 tablet  2  . dronabinol (MARINOL) 2.5 MG capsule Take 1 capsule (2.5 mg total) by mouth 2 (two) times daily before a meal.  60 capsule  0  . fluconazole (DIFLUCAN) 100 MG tablet Take 1 tablet (100 mg total) by mouth daily.  30 tablet  3  . folic acid (FOLVITE) 1 MG tablet TAKE 1 TABLET BY MOUTH DAILY START 5 DAYS BEFORE CHEMO AND CONTINUE 21 DAYS AFTER  100 tablet  3  . HYDROcodone-acetaminophen (NORCO) 10-325 MG per tablet Take 1 tablet by mouth every 6 (six) hours as needed.  120 tablet  0  . lidocaine-prilocaine (EMLA) cream Apply topically as needed (port access).       Marland Kitchen loperamide (IMODIUM) 2 MG capsule Take 2 mg by mouth 4 (four) times daily as needed. For diarrhea      . LORazepam (ATIVAN) 0.5 MG tablet Take 0.5 mg by mouth at bedtime.       . methylphenidate (RITALIN) 10 MG tablet Take 10 mg by mouth 2 (two) times daily with breakfast and lunch. Take 1 pill TWICE  a day.      . metoprolol (LOPRESSOR) 50 MG tablet Take 1 tablet (50 mg total) by mouth 2 (two) times daily.  60 tablet  3  . ondansetron (ZOFRAN) 8 MG tablet Take 1 tablet (8 mg total) by mouth every 8 (eight) hours as needed for nausea or vomiting.  20 tablet  5  . prochlorperazine (COMPAZINE) 10 MG tablet TAKE 1 TABLET (10 MG TOTAL) BY MOUTH EVERY 6 (SIX) HOURS AS NEEDED FOR NAUSEA OR VOMITING.  30 tablet  1   No current facility-administered medications for this encounter.   Facility-Administered Medications Ordered in Other Encounters  Medication Dose Route Frequency Provider Last Rate Last Dose  . sodium chloride 0.9 % injection 10 mL  10 mL Intracatheter PRN Kimberly Napoleon, MD   10 mL at 09/04/12 1511  . sodium chloride 0.9 % injection 10 mL   10 mL Intravenous PRN Kimberly Napoleon, MD   10 mL at 07/02/13 1529  . topical emolient (BIAFINE) emulsion   Topical BID Eppie Gibson, MD        Physical Findings: The patient is in no acute distress. Patient is alert and oriented.  height is 5' 0.2" (1.529 m). Her temperature is 97.6 F (36.4 C). Her blood pressure is 120/81 and her pulse is 126. Her oxygen saturation is 95%. .    In wheelchair. Frail. Could not stand on scale, but appears to have lost significant weight. Oropharynx without thrush. Swelling of LEs; Left ankle, more than Right.  Lab Findings: Lab Results  Component Value Date   WBC 12.6* 07/25/2013   HGB 10.2* 07/25/2013   HCT 31.1* 07/25/2013   MCV 97 07/25/2013   PLT 143* 07/25/2013    Radiographic Findings: Dg Chest 1 View  07/18/2013   CLINICAL DATA:  Placement of chest drains, lung cancer  EXAM: CHEST - 1 VIEW  COMPARISON:  Two-view chest 07/16/2013  FINDINGS: Bilateral chest tubes with tips in the medial aspect of the right and left lung apices. No appreciable pneumothorax. Right-sided porta catheter tip superior vena cava. Near complete resolution of the left pleural effusion. Small residual right pleural effusion. Diffuse increased density within the right lung base. Multiple pulmonary nodules appreciated within the left hemi thorax and to a lesser extent the right. Cement augmentation of multiple thoracic and lumbar vertebral bodies. No acute osseous abnormalities.  IMPRESSION: Near complete resolution of the left pleural effusion. Small residual right pleural effusion. Bibasilar atelectasis versus infiltrates. Persistent bilateral pulmonary nodules consistent with metastatic disease. No pneumothorax.   Electronically Signed   By: Margaree Mackintosh M.D.   On: 07/18/2013 12:26   Dg Chest 1 View  07/07/2013   CLINICAL DATA:  Status post left thoracentesis.  EXAM: CHEST - 1 VIEW  COMPARISON:  07/07/2013 at 1038 hr  FINDINGS: Right jugular Port-A-Cath remains in place with tip  overlying the mid upper SVC. Cardiac silhouette is partially obscured and may be mildly enlarged. There is a small left pleural effusion, significantly decreased in size compared to the examination from earlier today. There may be a small right pleural effusion, unchanged. Bilateral pulmonary nodules are again seen. There is improved aeration of the left lower lobe. No pneumothorax is identified. Cement augmentation of multiple thoracolumbar compression fractures is again noted.  IMPRESSION: Small left pleural effusion, decreased in size following thoracentesis. No pneumothorax identified.   Electronically Signed   By: Logan Bores   On: 07/07/2013 13:54   Dg Chest 2 View  07/16/2013   CLINICAL DATA:  Shortness of breath, evaluate for fluid build-up, history of metastatic lung carcinoma post chemotherapy  EXAM: CHEST  2 VIEW  COMPARISON:  Chest x-ray of 07/11/2013 and CT chest of 06/12/2013  FINDINGS: The pleural effusions have increased somewhat in volume being moderately large with resultant bibasilar atelectasis. Pulmonary nodules are again noted bilaterally consistent with diffuse pulmonary metastases. A right-sided Port-A-Cath remains and cardiomegaly is stable. Multiple thoracic and lumbar vertebroplasties are noted.  IMPRESSION: 1. Increase in volume of bilateral pleural effusions with increase in basilar atelectasis. 2. Diffuse bilateral lung metastases.   Electronically Signed   By: Ivar Drape M.D.   On: 07/16/2013 15:52   Dg Chest 2 View  07/11/2013   CLINICAL DATA:  evaluatet for pleural effusion recurrence on left lung  EXAM: CHEST  2 VIEW  COMPARISON:  Portable chest radiograph 07/07/2013, CT chest abdomen pelvis dated 06/12/2013.  FINDINGS: Low lung volumes. Stable small left pleural effusion. Right porta catheter tip projecting region superior vena cava. Multiple bilateral pulmonary nodules are appreciated stable. No new focal regions of consolidation or new focal infiltrates. Minimal blunting  of the right costophrenic angle. No acute osseous abnormalities. Multilevel vertebral cement augmentation.  IMPRESSION: Stable small left pleural effusion. Minimal blunting right costophrenic angle may represent a trace effusion.  Multiple bilateral pulmonary opacities consistent with metastatic disease.   Electronically Signed   By: Margaree Mackintosh M.D.   On: 07/11/2013 13:24   Dg Chest 2 View  07/07/2013   CLINICAL DATA:  History of breast cancer and lung cancer. Shortness of breath for 2 weeks.  EXAM: CHEST  2 VIEW  COMPARISON:  Chest CT 06/12/2013  FINDINGS: Right jugular Port-A-Cath is present with tip overlying the mid to upper SVC. Cardiac silhouette is largely obscured. There is a moderate left pleural effusion, increased from the prior study. Dominant left lower lobe mass on CT is obscured by the pleural effusion and adjacent parenchymal consolidation/atelectasis. Multiple pulmonary nodules are seen throughout both lungs. There may be a small right pleural effusion. Sequelae of cement augmentation of multiple thoracic and lumbar spine compression fractures are again identified.  IMPRESSION: 1. Moderate left pleural effusion, increased from prior CT, with associated left lower lobe atelectasis. Superimposed left lower lobe infection not excluded. 2. Bilateral pulmonary nodules, consistent with known metastatic disease.   Electronically Signed   By: Logan Bores   On: 07/07/2013 10:45   Ir Lenise Arena W Catheter Placement  07/18/2013   CLINICAL DATA:  58 year old with lung cancer and recurrent pleural effusions. A right pleural catheter was placed yesterday. Plan for placement of a left pleural catheter today.  EXAM: PLACEMENT OF TUNNELED LEFT PLEURAL CATHETER WITH ULTRASOUND AND FLUOROSCOPIC GUIDANCE  Physician: Stephan Minister. Henn, MD  FLUOROSCOPY TIME:  42 seconds  MEDICATIONS: 2 g Ancef. As antibiotic prophylaxis, Ancef was ordered pre-procedure and administered intravenously within one hour of incision. 2  mg Versed, 100 mcg fentanyl. A radiology nurse monitored the patient for moderate sedation.  ANESTHESIA/SEDATION: Moderate sedation time: 30 min  PROCEDURE: The procedure was explained to the patient. The risks and benefits of the procedure were discussed and the patient's questions were addressed. Informed consent was obtained from the patient. The patient was placed supine on the interventional table. The patient was mildly elevated to help with her breathing. The left side of the chest was evaluated with ultrasound. The left mid axillary region was prepped and draped in sterile fashion. Maximal barrier sterile technique  was utilized including caps, mask, sterile gowns, sterile gloves, sterile drape, hand hygiene and skin antiseptic. The skin was anesthetized with 1% lidocaine. Using ultrasound guidance, a Yueh catheter was directed into the left pleural fluid. Clear yellow fluid was aspirated. Skin was anesthetized below the pleural puncture site and a small incision was made. A PleurX catheter was placed through this incision and a subcutaneous tunnel was created to the pleural access site. The Yueh catheter was removed over a stiff Amplatz wire. The pleural catheter was intentionally cut and slightly shortened. The tract was dilated and a peel-away sheath was placed. The catheter was placed through the peel-away sheath and directed into the pleural space. Approximately 700 mL of pleural fluid was removed. The pleural access site was closed using absorbable suture and Dermabond. The catheter was secured to the skin with Prolene suture. Fluoroscopic and ultrasound images were taken and saved for documentation.  FINDINGS: Left pleural effusion. Catheter tip in the medial left upper chest. 700 mL of yellow fluid was removed. A small amount of blood tinged pleural fluid was noted at the end of the procedure.  COMPLICATIONS: None  IMPRESSION: Successful placement of a left chest tunneled pleural catheter.    Electronically Signed   By: Markus Daft M.D.   On: 07/18/2013 12:51   Ir Guided Niel Hummer W Catheter Placement  07/17/2013   CLINICAL DATA:  Metastatic lung carcinoma. Recurrent moderate bilateral pleural effusions.  EXAM: TUNNELED PLEURAL CUFFED DRAIN CATHETER PLACEMENT UNDER ULTRASOUND AND FLUOROSCOPIC GUIDANCE  FLUOROSCOPY TIME:  6 seconds  TECHNIQUE: The procedure, risks (including but not limited to bleeding, infection, organ damage ), benefits, and alternatives were explained to the patient. Questions regarding the procedure were encouraged and answered. The patient understands and consents to the procedure. Survey ultrasound of the right pleural space was performed and an appropriate skin entry site was selected. As antibiotic prophylaxis, cefazolin 2 g was ordered pre-procedure and administered intravenously within one hour of incision. Skin site was marked, prepped with Betadine, and draped in usual sterile fashion, and infiltrated locally with 1% lidocaine. Under real-time ultrasound guidance, a 18 gauge needle was advanced into the right pleural space. Ultrasound imaging documentation was saved. Pleural fluid could be aspirated. An Amplatz guidewire advanced through the sheath easily. The PleurX cuffed drain catheter was tunneled from an appropriate skin entry site to the dermatotomy. The sheath was exchanged over the wire for a peel-away sheath, through which the PleurX catheter was advanced into the right pleural space. Fluoroscopy confirmed appropriate position of the catheter. The catheter was secured externally with 0 Prolene suture. The dermatotomy was closed with Dermabond. Total of 1 liters clear yellow pleural fluid were removed, a sample sent for the requested laboratory studies. The patient tolerated the procedure well, with no immediate complication.  IMPRESSION: 1. Technically successful tunneled cuffed right pleural PleurX drain catheter placement. 2. Right thoracentesis removing 1 L .    Electronically Signed   By: Arne Cleveland M.D.   On: 07/17/2013 16:12   Ir US Guide Bx Asp/drain  07/22/2013   CLINICAL DATA:  58 year old with lung cancer and recurrent pleural effusions. A right pleural catheter was placed yesterday. Plan for placement of a left pleural catheter today.  EXAM: PLACEMENT OF TUNNELED LEFT PLEURAL CATHETER WITH ULTRASOUND AND FLUOROSCOPIC GUIDANCE  Physician: Stephan Minister. Henn, MD  FLUOROSCOPY TIME:  42 seconds  MEDICATIONS: 2 g Ancef. As antibiotic prophylaxis, Ancef was ordered pre-procedure and administered intravenously within one hour of incision. 2  mg Versed, 100 mcg fentanyl. A radiology nurse monitored the patient for moderate sedation.  ANESTHESIA/SEDATION: Moderate sedation time: 30 min  PROCEDURE: The procedure was explained to the patient. The risks and benefits of the procedure were discussed and the patient's questions were addressed. Informed consent was obtained from the patient. The patient was placed supine on the interventional table. The patient was mildly elevated to help with her breathing. The left side of the chest was evaluated with ultrasound. The left mid axillary region was prepped and draped in sterile fashion. Maximal barrier sterile technique was utilized including caps, mask, sterile gowns, sterile gloves, sterile drape, hand hygiene and skin antiseptic. The skin was anesthetized with 1% lidocaine. Using ultrasound guidance, a Yueh catheter was directed into the left pleural fluid. Clear yellow fluid was aspirated. Skin was anesthetized below the pleural puncture site and a small incision was made. A PleurX catheter was placed through this incision and a subcutaneous tunnel was created to the pleural access site. The Yueh catheter was removed over a stiff Amplatz wire. The pleural catheter was intentionally cut and slightly shortened. The tract was dilated and a peel-away sheath was placed. The catheter was placed through the peel-away sheath and  directed into the pleural space. Approximately 700 mL of pleural fluid was removed. The pleural access site was closed using absorbable suture and Dermabond. The catheter was secured to the skin with Prolene suture. Fluoroscopic and ultrasound images were taken and saved for documentation.  FINDINGS: Left pleural effusion. Catheter tip in the medial left upper chest. 700 mL of yellow fluid was removed. A small amount of blood tinged pleural fluid was noted at the end of the procedure.  COMPLICATIONS: None  IMPRESSION: Successful placement of a left chest tunneled pleural catheter.   Electronically Signed   By: Markus Daft M.D.   On: 07/22/2013 16:25   US Thoracentesis Asp Pleural Space W/img Guide  07/07/2013   CLINICAL DATA:  History of breast cancer and lung cancer ; bilateral pleural effusion, left larger than right  EXAM: ULTRASOUND GUIDED left THORACENTESIS  COMPARISON:  None.  PROCEDURE: An ultrasound guided thoracentesis was thoroughly discussed with the patient and questions answered. The benefits, risks, alternatives and complications were also discussed. The patient understands and wishes to proceed with the procedure. Written consent was obtained.  Ultrasound was performed to localize and mark an adequate pocket of fluid in the left chest. The area was then prepped and draped in the normal sterile fashion. 1% Lidocaine was used for local anesthesia. Under ultrasound guidance a 19 gauge Yueh catheter was introduced. Thoracentesis was performed. The catheter was removed and a dressing applied.  Complications:  None  FINDINGS: A total of approximately 950 cc of greenish yellow fluid was removed. A fluid sample was notsent for laboratory analysis.  IMPRESSION: Successful ultrasound guided left thoracentesis yielding 950 cc of pleural fluid.  Read by: Jannifer Franklin [facility]   Electronically Signed   By: Daryll Brod M.D.   On: 07/07/2013 15:10    Impression/Plan:  She will continue on hospice. She has  progressive cancer that is now refractory to salvage chemotherapy. We talked about her poor appetite. I told the patient and her husband that there is very little family members can do other than making food and drinks available to her. We talked about ways to avoid tension and power struggles around her oral intake. Her husband was relieved to hear that most of her by mouth intake is not under  his control. He was feeling some guilt around this issue.  For her constipation, I recommended taking one senna tablet twice a day and she knows that she can escalate this as needed. For an 8.6 mg tablet, she can take up to 4 tablets twice a day. She'll titrate this as needed, to prevent constipation while using her narcotics more liberally to address her pain.  They know they can call at any time if I may be of further help. No f/u will be scheduled.  Her mother will be visiting her within the next week from [place]. She is in the end stages of her disease and they seem to have a good understanding of this. It is very sad to see her decline; she has been an inspiration to me.  _____________________________________   Eppie Gibson, MD

## 2013-07-30 ENCOUNTER — Ambulatory Visit
Admission: RE | Admit: 2013-07-30 | Discharge: 2013-07-30 | Disposition: A | Discharge status: Home / Self Care | Payer: BC Managed Care – PPO | Source: Ambulatory Visit | Attending: Radiation Oncology | Admitting: Radiation Oncology

## 2013-07-30 ENCOUNTER — Encounter: Payer: Self-pay | Admitting: Radiation Oncology

## 2013-07-30 VITALS — BP 120/81 | HR 126 | Temp 97.6°F | Ht 60.2 in

## 2013-07-30 DIAGNOSIS — C7949 Secondary malignant neoplasm of other parts of nervous system: Principal | ICD-10-CM

## 2013-07-30 DIAGNOSIS — C7931 Secondary malignant neoplasm of brain: Secondary | ICD-10-CM

## 2013-07-30 NOTE — Progress Notes (Signed)
[name] here for reassessment s/p radiation to the brain.  She is traveling by wheelchair and is unable to stand to weigh.  She reports a recent fall.  She denies any headaches today, nor double vision, but reports intemittent nausea/vomiting, last when coming into the cancer today. She has been experiencing a slightly moist cough within the past 2 days.  She has had recurrent pleural effusions and her last thoracentesis was this past Monday. .  Her pulse is elevated at 126, but is regular.  Note decreased breath sounds bilaterally.  Shallow breaths, but O2 sat 95 % on RA. Afebrile.

## 2013-08-04 ENCOUNTER — Ambulatory Visit (HOSPITAL_BASED_OUTPATIENT_CLINIC_OR_DEPARTMENT_OTHER): Payer: BC Managed Care – PPO

## 2013-08-04 ENCOUNTER — Ambulatory Visit: Payer: BC Managed Care – PPO

## 2013-08-04 ENCOUNTER — Inpatient Hospital Stay (HOSPITAL_COMMUNITY)
Admission: AD | Admit: 2013-08-04 | Discharge: 2013-08-16 | DRG: 299 | Disposition: E | Payer: BC Managed Care – PPO | Source: Ambulatory Visit | Attending: Hematology & Oncology | Admitting: Hematology & Oncology

## 2013-08-04 ENCOUNTER — Other Ambulatory Visit: Payer: BC Managed Care – PPO | Admitting: Lab

## 2013-08-04 ENCOUNTER — Encounter: Payer: Self-pay | Admitting: Family

## 2013-08-04 ENCOUNTER — Ambulatory Visit: Payer: BC Managed Care – PPO | Admitting: Hematology & Oncology

## 2013-08-04 ENCOUNTER — Other Ambulatory Visit: Payer: Self-pay | Admitting: Hematology & Oncology

## 2013-08-04 ENCOUNTER — Ambulatory Visit (HOSPITAL_BASED_OUTPATIENT_CLINIC_OR_DEPARTMENT_OTHER): Payer: BC Managed Care – PPO | Admitting: Family

## 2013-08-04 ENCOUNTER — Encounter (HOSPITAL_COMMUNITY): Payer: Self-pay | Admitting: *Deleted

## 2013-08-04 ENCOUNTER — Inpatient Hospital Stay (HOSPITAL_COMMUNITY): Payer: BC Managed Care – PPO

## 2013-08-04 VITALS — BP 108/60 | HR 134 | Temp 98.2°F | Resp 16

## 2013-08-04 DIAGNOSIS — A419 Sepsis, unspecified organism: Secondary | ICD-10-CM | POA: Diagnosis not present

## 2013-08-04 DIAGNOSIS — C7952 Secondary malignant neoplasm of bone marrow: Secondary | ICD-10-CM

## 2013-08-04 DIAGNOSIS — R000 Tachycardia, unspecified: Secondary | ICD-10-CM | POA: Diagnosis present

## 2013-08-04 DIAGNOSIS — C7931 Secondary malignant neoplasm of brain: Secondary | ICD-10-CM | POA: Diagnosis present

## 2013-08-04 DIAGNOSIS — C3490 Malignant neoplasm of unspecified part of unspecified bronchus or lung: Secondary | ICD-10-CM | POA: Diagnosis present

## 2013-08-04 DIAGNOSIS — E43 Unspecified severe protein-calorie malnutrition: Secondary | ICD-10-CM | POA: Diagnosis present

## 2013-08-04 DIAGNOSIS — Z923 Personal history of irradiation: Secondary | ICD-10-CM

## 2013-08-04 DIAGNOSIS — I959 Hypotension, unspecified: Secondary | ICD-10-CM | POA: Diagnosis present

## 2013-08-04 DIAGNOSIS — C3492 Malignant neoplasm of unspecified part of left bronchus or lung: Secondary | ICD-10-CM

## 2013-08-04 DIAGNOSIS — Z515 Encounter for palliative care: Secondary | ICD-10-CM

## 2013-08-04 DIAGNOSIS — C3430 Malignant neoplasm of lower lobe, unspecified bronchus or lung: Secondary | ICD-10-CM

## 2013-08-04 DIAGNOSIS — C7951 Secondary malignant neoplasm of bone: Secondary | ICD-10-CM | POA: Diagnosis present

## 2013-08-04 DIAGNOSIS — E872 Acidosis, unspecified: Secondary | ICD-10-CM | POA: Diagnosis present

## 2013-08-04 DIAGNOSIS — J90 Pleural effusion, not elsewhere classified: Secondary | ICD-10-CM | POA: Diagnosis present

## 2013-08-04 DIAGNOSIS — D594 Other nonautoimmune hemolytic anemias: Secondary | ICD-10-CM | POA: Diagnosis present

## 2013-08-04 DIAGNOSIS — L988 Other specified disorders of the skin and subcutaneous tissue: Secondary | ICD-10-CM | POA: Diagnosis present

## 2013-08-04 DIAGNOSIS — N289 Disorder of kidney and ureter, unspecified: Secondary | ICD-10-CM | POA: Diagnosis present

## 2013-08-04 DIAGNOSIS — Z66 Do not resuscitate: Secondary | ICD-10-CM | POA: Diagnosis present

## 2013-08-04 DIAGNOSIS — C7802 Secondary malignant neoplasm of left lung: Secondary | ICD-10-CM

## 2013-08-04 DIAGNOSIS — D6869 Other thrombophilia: Secondary | ICD-10-CM | POA: Diagnosis present

## 2013-08-04 DIAGNOSIS — I743 Embolism and thrombosis of arteries of the lower extremities: Secondary | ICD-10-CM | POA: Diagnosis present

## 2013-08-04 DIAGNOSIS — I749 Embolism and thrombosis of unspecified artery: Secondary | ICD-10-CM

## 2013-08-04 DIAGNOSIS — Z79899 Other long term (current) drug therapy: Secondary | ICD-10-CM | POA: Diagnosis not present

## 2013-08-04 DIAGNOSIS — C7949 Secondary malignant neoplasm of other parts of nervous system: Secondary | ICD-10-CM

## 2013-08-04 DIAGNOSIS — Z6826 Body mass index (BMI) 26.0-26.9, adult: Secondary | ICD-10-CM

## 2013-08-04 DIAGNOSIS — C800 Disseminated malignant neoplasm, unspecified: Secondary | ICD-10-CM

## 2013-08-04 DIAGNOSIS — R040 Epistaxis: Secondary | ICD-10-CM | POA: Diagnosis present

## 2013-08-04 DIAGNOSIS — C50919 Malignant neoplasm of unspecified site of unspecified female breast: Secondary | ICD-10-CM

## 2013-08-04 DIAGNOSIS — R339 Retention of urine, unspecified: Secondary | ICD-10-CM | POA: Diagnosis present

## 2013-08-04 DIAGNOSIS — D630 Anemia in neoplastic disease: Secondary | ICD-10-CM | POA: Diagnosis present

## 2013-08-04 DIAGNOSIS — R230 Cyanosis: Secondary | ICD-10-CM | POA: Diagnosis present

## 2013-08-04 DIAGNOSIS — I96 Gangrene, not elsewhere classified: Secondary | ICD-10-CM | POA: Diagnosis present

## 2013-08-04 DIAGNOSIS — N179 Acute kidney failure, unspecified: Secondary | ICD-10-CM | POA: Diagnosis present

## 2013-08-04 DIAGNOSIS — G589 Mononeuropathy, unspecified: Secondary | ICD-10-CM | POA: Diagnosis present

## 2013-08-04 DIAGNOSIS — D6959 Other secondary thrombocytopenia: Secondary | ICD-10-CM | POA: Diagnosis present

## 2013-08-04 DIAGNOSIS — Z9221 Personal history of antineoplastic chemotherapy: Secondary | ICD-10-CM | POA: Diagnosis not present

## 2013-08-04 DIAGNOSIS — D6859 Other primary thrombophilia: Secondary | ICD-10-CM

## 2013-08-04 LAB — COMPREHENSIVE METABOLIC PANEL
ALK PHOS: 224 U/L — AB (ref 39–117)
ALT: 15 U/L (ref 0–35)
AST: 22 U/L (ref 0–37)
Albumin: 2.1 g/dL — ABNORMAL LOW (ref 3.5–5.2)
Anion gap: 16 — ABNORMAL HIGH (ref 5–15)
BILIRUBIN TOTAL: 0.3 mg/dL (ref 0.3–1.2)
BUN: 45 mg/dL — ABNORMAL HIGH (ref 6–23)
CHLORIDE: 92 meq/L — AB (ref 96–112)
CO2: 23 meq/L (ref 19–32)
Calcium: 8.3 mg/dL — ABNORMAL LOW (ref 8.4–10.5)
Creatinine, Ser: 1.49 mg/dL — ABNORMAL HIGH (ref 0.50–1.10)
GFR calc Af Amer: 43 mL/min — ABNORMAL LOW (ref >90)
GFR, EST NON AFRICAN AMERICAN: 37 mL/min — AB (ref >90)
GLUCOSE: 112 mg/dL — AB (ref 70–99)
POTASSIUM: 4.8 meq/L (ref 3.7–5.3)
SODIUM: 131 meq/L — AB (ref 137–147)
Total Protein: 5.8 g/dL — ABNORMAL LOW (ref 6.0–8.3)

## 2013-08-04 LAB — PROTIME-INR
INR: 1.59 — AB (ref 0.00–1.49)
Prothrombin Time: 19 seconds — ABNORMAL HIGH (ref 11.6–15.2)

## 2013-08-04 LAB — CBC
HCT: 21.9 % — ABNORMAL LOW (ref 36.0–46.0)
HEMOGLOBIN: 7.2 g/dL — AB (ref 12.0–15.0)
MCH: 31.6 pg (ref 26.0–34.0)
MCHC: 32.9 g/dL (ref 30.0–36.0)
MCV: 96.1 fL (ref 78.0–100.0)
PLATELETS: 37 10*3/uL — AB (ref 150–400)
RBC: 2.28 MIL/uL — AB (ref 3.87–5.11)
RDW: 19.5 % — ABNORMAL HIGH (ref 11.5–15.5)
WBC: 12.3 10*3/uL — AB (ref 4.0–10.5)

## 2013-08-04 LAB — APTT: aPTT: 64 seconds — ABNORMAL HIGH (ref 24–37)

## 2013-08-04 MED ORDER — SODIUM CHLORIDE 0.9 % IV SOLN
INTRAVENOUS | Status: DC
  Administered 2013-08-04: 19:00:00 via INTRAVENOUS

## 2013-08-04 MED ORDER — SODIUM CHLORIDE 0.9 % IJ SOLN
10.0000 mL | INTRAMUSCULAR | Status: DC | PRN
  Administered 2013-08-04: 10 mL via INTRAVENOUS
  Filled 2013-08-04: qty 10

## 2013-08-04 MED ORDER — HYDROMORPHONE HCL PF 2 MG/ML IJ SOLN
1.0000 mg | Freq: Once | INTRAMUSCULAR | Status: AC
  Administered 2013-08-04: 1 mg via SUBCUTANEOUS

## 2013-08-04 MED ORDER — HEPARIN (PORCINE) IN NACL 100-0.45 UNIT/ML-% IJ SOLN
1000.0000 [IU]/h | INTRAMUSCULAR | Status: DC
  Administered 2013-08-05: 1000 [IU]/h via INTRAVENOUS
  Filled 2013-08-04 (×2): qty 250

## 2013-08-04 MED ORDER — ONDANSETRON HCL 8 MG PO TABS
8.0000 mg | ORAL_TABLET | Freq: Three times a day (TID) | ORAL | Status: DC | PRN
  Administered 2013-08-11: 8 mg via ORAL
  Filled 2013-08-04: qty 1

## 2013-08-04 MED ORDER — ASPIRIN EC 325 MG PO TBEC
325.0000 mg | DELAYED_RELEASE_TABLET | Freq: Every day | ORAL | Status: DC
  Administered 2013-08-05 – 2013-08-10 (×6): 325 mg via ORAL
  Filled 2013-08-04 (×7): qty 1

## 2013-08-04 MED ORDER — METHYLPHENIDATE HCL 5 MG PO TABS
10.0000 mg | ORAL_TABLET | Freq: Two times a day (BID) | ORAL | Status: DC
  Administered 2013-08-05 – 2013-08-10 (×10): 10 mg via ORAL
  Filled 2013-08-04 (×14): qty 2

## 2013-08-04 MED ORDER — LIDOCAINE-PRILOCAINE 2.5-2.5 % EX CREA: TOPICAL_CREAM | CUTANEOUS | Status: DC | PRN

## 2013-08-04 MED ORDER — DEXAMETHASONE 4 MG PO TABS
4.0000 mg | ORAL_TABLET | Freq: Every day | ORAL | Status: DC
Start: 2013-08-05 — End: 2013-08-06
  Administered 2013-08-05: 4 mg via ORAL
  Filled 2013-08-04 (×2): qty 1

## 2013-08-04 MED ORDER — FLUCONAZOLE 100 MG PO TABS
100.0000 mg | ORAL_TABLET | Freq: Every day | ORAL | Status: DC
  Administered 2013-08-05 – 2013-08-07 (×3): 100 mg via ORAL
  Filled 2013-08-04 (×3): qty 1

## 2013-08-04 MED ORDER — HYDROMORPHONE HCL PF 2 MG/ML IJ SOLN
INTRAMUSCULAR | Status: AC
  Filled 2013-08-04: qty 1

## 2013-08-04 MED ORDER — FOLIC ACID 1 MG PO TABS
1.0000 mg | ORAL_TABLET | Freq: Every day | ORAL | Status: DC
  Administered 2013-08-05: 1 mg via ORAL
  Filled 2013-08-04 (×2): qty 1

## 2013-08-04 MED ORDER — ALPRAZOLAM 0.25 MG PO TABS
0.2500 mg | ORAL_TABLET | Freq: Three times a day (TID) | ORAL | Status: DC | PRN
  Administered 2013-08-06 – 2013-08-08 (×2): 0.5 mg via ORAL
  Filled 2013-08-04 (×2): qty 2

## 2013-08-04 MED ORDER — SODIUM CHLORIDE 0.9 % IV SOLN
INTRAVENOUS | Status: DC
  Administered 2013-08-04 – 2013-08-06 (×3): via INTRAVENOUS
  Administered 2013-08-09: 1000 mL via INTRAVENOUS
  Administered 2013-08-10: 10:00:00 via INTRAVENOUS

## 2013-08-04 MED ORDER — METOPROLOL TARTRATE 50 MG PO TABS
50.0000 mg | ORAL_TABLET | Freq: Two times a day (BID) | ORAL | Status: DC
  Administered 2013-08-04 – 2013-08-10 (×13): 50 mg via ORAL
  Filled 2013-08-04 (×18): qty 1

## 2013-08-04 MED ORDER — PROCHLORPERAZINE MALEATE 10 MG PO TABS: 10.0000 mg | ORAL_TABLET | Freq: Four times a day (QID) | ORAL | Status: DC | PRN

## 2013-08-04 MED ORDER — HYDROCODONE-ACETAMINOPHEN 10-325 MG PO TABS
1.0000 | ORAL_TABLET | Freq: Four times a day (QID) | ORAL | Status: DC | PRN
  Administered 2013-08-04 – 2013-08-07 (×4): 2 via ORAL
  Filled 2013-08-04 (×4): qty 2

## 2013-08-04 MED ORDER — DRONABINOL 2.5 MG PO CAPS
2.5000 mg | ORAL_CAPSULE | Freq: Two times a day (BID) | ORAL | Status: DC
  Administered 2013-08-05 – 2013-08-10 (×11): 2.5 mg via ORAL
  Filled 2013-08-04 (×11): qty 1

## 2013-08-04 MED ORDER — HEPARIN SOD (PORK) LOCK FLUSH 100 UNIT/ML IV SOLN
500.0000 [IU] | Freq: Once | INTRAVENOUS | Status: AC
  Administered 2013-08-04: 500 [IU] via INTRAVENOUS
  Filled 2013-08-04: qty 5

## 2013-08-04 MED ORDER — LORAZEPAM 0.5 MG PO TABS
0.5000 mg | ORAL_TABLET | Freq: Every day | ORAL | Status: DC
  Administered 2013-08-04 – 2013-08-10 (×6): 0.5 mg via ORAL
  Filled 2013-08-04 (×6): qty 1

## 2013-08-04 MED ORDER — HEPARIN (PORCINE) IN NACL 100-0.45 UNIT/ML-% IJ SOLN: 1000.0000 [IU]/h | INTRAMUSCULAR | Status: DC

## 2013-08-04 MED ORDER — LOPERAMIDE HCL 2 MG PO CAPS: 2.0000 mg | ORAL_CAPSULE | Freq: Four times a day (QID) | ORAL | Status: DC | PRN

## 2013-08-04 NOTE — H&P (Signed)
#   [id number] is admit note.  Port is accessed.  She got 100mg  Lovenox in the ofice.  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  She is DNR.  Lum Keas

## 2013-08-04 NOTE — Progress Notes (Signed)
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 Her right foot is purple on her foot pad, heel and toes. Her great and second toe on her left foot as well as her foot pad and heel are also purple. These purple areas are cold and painful to the touch. Her pedal pulses are +1 in both feet. She denies headaches, dizziness, SOB, chest pain, palpitations, abdominal pain constipation, diarrhea or blood in urine or stool. She has no appetite. She is too weak to walk and her husband is wheeling her around in a wheelchair.   CURRENT TREATMENT: Observation  REVIEW OF SYSTEMS: All other 10 point review of systems is negative except for those issues listed above.  PAST MEDICAL HISTORY: Past Medical History  Diagnosis Date  . Pulmonary nodules 06/22/11    per CT scan  . IBS (irritable bowel syndrome)   . Fibroadenoma of breast     hx of  . Hx of radiation therapy 07/05/11 -07/21/11    LS, Tspine  . Hx of radiation therapy  completed7/3/13    brain, SRS  . Headache(784.0)   . Compression fracture     Multiple - Kyphoplasty  . Status post chemotherapy planned start 07/24/12    Alimta/carboplatin/Avastin / Prior chemotherapy Tarceva and Afatinib ,  . S/P radiation therapy 07/19/11     stereotactic radiosurgery to the right frontal, right thalamic, left frontal and left occipital target, 20 Gray in 1 fraction to each target  . Brain lesion 06/2712    New Lesions -3 mm, left mid cerebellum, 5 mm, left medial temporal lobe, 4 mm, right superior cerebellum, 3 mm, right anterior temporal lobe, 2 mm, left parietal cortex   . S/P radiation therapy  07/26/2012      20 Gy to Left temporal, Left cerebellar,Left posterior superior frontal, Right anterior temporal, Right superior vermian, Left occipital       . Hx of radiation therapy 10/21/12    SRS brain  . Shingles 01/02/13  . Postmenopausal HRT (hormone replacement therapy)     10/18/2005 - 12/2011  . Metastasis of unknown origin   . Adenocarcinoma of lung Dx'd 06/2011  . 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 Breast cancer 01/04/12  . Metastatic lung cancer     Epidermal Growth Factor Positive - Afatinib  . Breast cancer 01/29/2013  . S/P radiation therapy  12/30/2012    Right Frontal and Right Temoporal lobes  . S/P radiation therapy 04/29/2013-05/14/2013    Sacral Lesion  . S/P radiation therapy 06/16/2013-07/03/2013    Whole brain / 35 Gy in 14 fractions   PAST SURGICAL HISTORY: Past Surgical History  Procedure Laterality Date  . Lung biopsy    . Vertebroplasty    . Vertebroplasty    . Breast biopsy  2013    right breast  . Intrauterine device insertion  10/2005    Mirena was removed 01/24/11   FAMILY HISTORY Family History  Problem Relation Age of Onset  . Cervical cancer Mother 29    cervical  . Heart disease Mother 16    CABG x 3  . Asthma Father   .  Hypertension Father   . Heart disease Maternal Grandmother   . Skin cancer Maternal Grandfather     skin  . Pancreatic cancer Paternal Grandmother     pancreatic  . Heart disease Paternal Grandfather   . Throat cancer Brother 38   GYNECOLOGIC HISTORY:  Patient's last menstrual period was 01/17/2004.   SOCIAL HISTORY:  History   Social History  . Marital Status: Married    Spouse Name: N/A    Number of Children: N/A  . Years of Education: N/A   Occupational History  . Not on file.   Social History Main Topics  . Smoking status: Never Smoker   . Smokeless tobacco: Never Used     Comment: never used tobacco  . Alcohol Use: Yes     Comment: less than one a  week  . Drug Use: No  . Sexual Activity: Yes    Partners: Male   Other Topics Concern  . Not on file   Social History Narrative  . No narrative on file   ADVANCED DIRECTIVES: <no information>  HEALTH MAINTENANCE: History  Substance Use Topics  . Smoking status: Never Smoker   . Smokeless tobacco: Never Used     Comment: never used tobacco  . Alcohol Use: Yes     Comment: less than one a week   Colonoscopy: PAP: Bone density: Lipid panel:  No Known Allergies  Current Outpatient Prescriptions  Medication Sig Dispense Refill  . ALPRAZolam (XANAX) 0.25 MG tablet Take 0.25-0.5 mg by mouth as needed. For anxiety.      . Alum & Mag Hydroxide-Simeth (MAGIC MOUTHWASH) SOLN Take 10 mLs by mouth 4 (four) times daily.  500 mL  3  . dexamethasone (DECADRON) 4 MG tablet Take 1 tablet (4 mg total) by mouth 2 (two) times daily with a meal.  60 tablet  2  . dronabinol (MARINOL) 2.5 MG capsule Take 1 capsule (2.5 mg total) by mouth 2 (two) times daily before a meal.  60 capsule  0  . fluconazole (DIFLUCAN) 100 MG tablet Take 1 tablet (100 mg total) by mouth daily.  30 tablet  3  . folic acid (FOLVITE) 1 MG tablet TAKE 1 TABLET BY MOUTH DAILY START 5 DAYS BEFORE CHEMO AND CONTINUE 21 DAYS AFTER  100 tablet  3  . HYDROcodone-acetaminophen (NORCO) 10-325 MG per tablet Take 1 tablet by mouth every 6 (six) hours as needed.  120 tablet  0  . lidocaine-prilocaine (EMLA) cream Apply topically as needed (port access).       Marland Kitchen loperamide (IMODIUM) 2 MG capsule Take 2 mg by mouth 4 (four) times daily as needed. For diarrhea      . LORazepam (ATIVAN) 0.5 MG tablet Take 0.5 mg by mouth at bedtime.       . methylphenidate (RITALIN) 10 MG tablet Take 10 mg by mouth 2 (two) times daily with breakfast and lunch. Take 1 pill TWICE a day.      . metoprolol (LOPRESSOR) 50 MG tablet Take 1 tablet (50 mg total) by mouth 2 (two) times daily.  60 tablet  3  . ondansetron (ZOFRAN) 8 MG tablet Take 1 tablet (8 mg  total) by mouth every 8 (eight) hours as needed for nausea or vomiting.  20 tablet  5  . prochlorperazine (COMPAZINE) 10 MG tablet TAKE 1 TABLET (10 MG TOTAL) BY MOUTH EVERY 6 (SIX) HOURS AS NEEDED FOR NAUSEA OR VOMITING.  30 tablet  1   No current facility-administered medications  for this visit.   Facility-Administered Medications Ordered in Other Visits  Medication Dose Route Frequency Provider Last Rate Last Dose  . heparin lock flush 100 unit/mL  500 Units Intravenous Once Volanda Napoleon, MD      . sodium chloride 0.9 % injection 10 mL  10 mL Intracatheter PRN Volanda Napoleon, MD   10 mL at 09/04/12 1511  . sodium chloride 0.9 % injection 10 mL  10 mL Intravenous PRN Volanda Napoleon, MD   10 mL at 07/02/13 1529  . sodium chloride 0.9 % injection 10 mL  10 mL Intravenous PRN Volanda Napoleon, MD      . topical emolient (BIAFINE) emulsion   Topical BID Eppie Gibson, MD       OBJECTIVE: Filed Vitals:   08/09/2013 1540  BP: 108/60  Pulse: 134  Temp: 98.2 F (36.8 C)  Resp: 16   There is no weight on file to calculate BMI. ECOG FS:3 - Symptomatic, >50% confined to bed Ocular: Sclerae unicteric, pupils equal, round and reactive to light Ear-nose-throat: Oropharynx clear, dentition fair Lymphatic: No cervical or supraclavicular adenopathy Lungs no rales or rhonchi, good excursion bilaterally Heart regular rate and rhythm, no murmur appreciated Abd soft, nontender, positive bowel sounds MSK no focal spinal tenderness, no joint edema Neuro: non-focal, well-oriented, appropriate affect Breasts: Deferred  LAB RESULTS:  No results found for this basename: SPEP, UPEP,  kappa and lambda light chains   Lab Results  Component Value Date   WBC 12.6* 07/25/2013   NEUTROABS 11.8* 07/25/2013   HGB 10.2* 07/25/2013   HCT 31.1* 07/25/2013   MCV 97 07/25/2013   PLT 143* 07/25/2013   No results found for this basename: LABCA2   No components found with this basename: [id number]   No results found  for this basename: INR,  in the last 168 hours  STUDIES:None  ASSESSMENT/PLAN: Mrs. Lybrand is a 58 year old female with metastatic poorly differentiated carcinoma. It has now been 2 years since initial diagnosis.The patient appears to have arterial clots that have shower into her feet. We will be direct admitting her into the hospital in order to better evaluate and treat her for this. The patient was discussed with and also seen by Dr. Marin Olp. The patient is on her way to [facility] at this time.   Eliezer Bottom, NP 07/28/2013 5:07 PM  Addendum by Dr. Marin Olp as follows:  She is being admitted to the hospital. A note will be dictated on her admission.

## 2013-08-04 NOTE — Patient Instructions (Signed)

## 2013-08-04 NOTE — Progress Notes (Signed)
ANTICOAGULATION CONSULT NOTE - Initial Consult  Pharmacy Consult for Heparin Indication: Arterial emboli to toes  No Known Allergies  Patient Measurements: Height: 5\' 2"  (157.5 cm) Weight: 131 lb (59.421 kg) IBW/kg (Calculated) : 50.1 Heparin Dosing Weight: 59.4 kg  Vital Signs: Temp: 97.9 F (36.6 C) (07/20 1816) Temp src: Oral (07/20 1816) BP: 124/78 mmHg (07/20 1816) Pulse Rate: 117 (07/20 1816)  Labs: No results found for this basename: HGB, HCT, PLT, APTT, LABPROT, INR, HEPARINUNFRC, CREATININE, CKTOTAL, CKMB, TROPONINI,  in the last 72 hours  Estimated Creatinine Clearance: 39.9 ml/min (by C-G formula based on Cr of 1.2).   Medical History: Past Medical History  Diagnosis Date  . Pulmonary nodules 06/22/11    per CT scan  . IBS (irritable bowel syndrome)   . Fibroadenoma of breast     hx of  . Hx of radiation therapy 07/05/11 -07/21/11    LS, Tspine  . Hx of radiation therapy  completed7/3/13    brain, SRS  . Headache(784.0)   . Compression fracture     Multiple - Kyphoplasty  . Status post chemotherapy planned start 07/24/12    Alimta/carboplatin/Avastin / Prior chemotherapy Tarceva and Afatinib ,  . S/P radiation therapy 07/19/11     stereotactic radiosurgery to the right frontal, right thalamic, left frontal and left occipital target, 20 Gray in 1 fraction to each target  . Brain lesion 06/2712    New Lesions -3 mm, left mid cerebellum, 5 mm, left medial temporal lobe, 4 mm, right superior cerebellum, 3 mm, right anterior temporal lobe, 2 mm, left parietal cortex   . S/P radiation therapy  07/26/2012     20 Gy to Left temporal, Left cerebellar,Left posterior superior frontal, Right anterior temporal, Right superior vermian, Left occipital       . Hx of radiation therapy 10/21/12    SRS brain  . Shingles 01/02/13  . Postmenopausal HRT (hormone replacement therapy)     10/18/2005 - 12/2011  . Metastasis of unknown origin   . Adenocarcinoma of lung Dx'd 06/2011  .  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 Breast cancer 01/04/12  . Metastatic lung cancer     Epidermal Growth Factor Positive - Afatinib  . Breast cancer 01/29/2013  . S/P radiation therapy  12/30/2012    Right Frontal and Right Temoporal lobes  . S/P radiation therapy 04/29/2013-05/14/2013    Sacral Lesion  . S/P radiation therapy 06/16/2013-07/03/2013    Whole brain / 35 Gy in 14 fractions    Medications:  Scheduled:  . aspirin EC  325 mg Oral Daily  . dexamethasone  4 mg Oral Daily  . [START ON 08/05/2013] dronabinol  2.5 mg Oral BID AC  . fluconazole  100 mg Oral Daily  . folic acid  1 mg Oral Daily  . LORazepam  0.5 mg Oral QHS  . [START ON 08/05/2013] methylphenidate  10 mg Oral BID WC  . metoprolol  50 mg Oral BID   Infusions:  . sodium chloride    . sodium chloride    . [START ON 08/05/2013] heparin     PRN: ALPRAZolam, HYDROcodone-acetaminophen, lidocaine-prilocaine, loperamide, ondansetron, prochlorperazine  Assessment: 58 yo female with metastatic lung cancer admitted from office due to concern for arterial emboli to the toes. Pharmacy consulted to dose IV heparin.  Per MD, she received lovenox 100mg  sq at 4pm today - this is a large dose given her weight of ~60kg (  1.6 mg/kg).  Goal of Therapy:  Heparin level 0.3-0.7 units/ml Monitor platelets by anticoagulation protocol: Yes   Plan:   Begin heparin infusion 1000 units/hr (no bolus) on 7/21 at 6am (14 hours after lovenox given to allow clearance)  Check heparin level 8 hours after heparin starts  Daily heparin level and CBC  Peggyann Juba, PharmD, BCPS Pager: [phone] 08/09/2013,7:07 PM

## 2013-08-05 DIAGNOSIS — D696 Thrombocytopenia, unspecified: Secondary | ICD-10-CM

## 2013-08-05 DIAGNOSIS — I739 Peripheral vascular disease, unspecified: Secondary | ICD-10-CM

## 2013-08-05 DIAGNOSIS — D649 Anemia, unspecified: Secondary | ICD-10-CM

## 2013-08-05 LAB — URINALYSIS, ROUTINE W REFLEX MICROSCOPIC
BILIRUBIN URINE: NEGATIVE
GLUCOSE, UA: NEGATIVE mg/dL
Ketones, ur: NEGATIVE mg/dL
NITRITE: NEGATIVE
PH: 6 (ref 5.0–8.0)
Protein, ur: 30 mg/dL — AB
Specific Gravity, Urine: 1.01 (ref 1.005–1.030)
Urobilinogen, UA: 1 mg/dL (ref 0.0–1.0)

## 2013-08-05 LAB — CBC
HEMATOCRIT: 20 % — AB (ref 36.0–46.0)
Hemoglobin: 6.4 g/dL — CL (ref 12.0–15.0)
MCH: 30.8 pg (ref 26.0–34.0)
MCHC: 32 g/dL (ref 30.0–36.0)
MCV: 96.2 fL (ref 78.0–100.0)
PLATELETS: 48 10*3/uL — AB (ref 150–400)
RBC: 2.08 MIL/uL — ABNORMAL LOW (ref 3.87–5.11)
RDW: 19.7 % — AB (ref 11.5–15.5)
WBC: 10.3 10*3/uL (ref 4.0–10.5)

## 2013-08-05 LAB — PREPARE RBC (CROSSMATCH)

## 2013-08-05 LAB — PREALBUMIN: Prealbumin: 19.7 mg/dL (ref 17.0–34.0)

## 2013-08-05 LAB — SAVE SMEAR

## 2013-08-05 LAB — LACTATE DEHYDROGENASE: LDH: 528 U/L — ABNORMAL HIGH (ref 94–250)

## 2013-08-05 LAB — URINE MICROSCOPIC-ADD ON

## 2013-08-05 LAB — RETICULOCYTES
RBC.: 2.1 MIL/uL — AB (ref 3.87–5.11)
RETIC COUNT ABSOLUTE: 157.5 10*3/uL (ref 19.0–186.0)
Retic Ct Pct: 7.5 % — ABNORMAL HIGH (ref 0.4–3.1)

## 2013-08-05 LAB — HEPARIN LEVEL (UNFRACTIONATED): Heparin Unfractionated: 1.3 IU/mL — ABNORMAL HIGH (ref 0.30–0.70)

## 2013-08-05 MED ORDER — HEPARIN (PORCINE) IN NACL 100-0.45 UNIT/ML-% IJ SOLN
800.0000 [IU]/h | INTRAMUSCULAR | Status: DC
  Administered 2013-08-05: 800 [IU]/h via INTRAVENOUS
  Filled 2013-08-05: qty 250

## 2013-08-05 MED ORDER — FENTANYL 12 MCG/HR TD PT72
12.5000 ug | MEDICATED_PATCH | TRANSDERMAL | Status: DC
  Administered 2013-08-05 – 2013-08-08 (×2): 12.5 ug via TRANSDERMAL
  Filled 2013-08-05 (×2): qty 1

## 2013-08-05 MED ORDER — HYDROMORPHONE HCL PF 1 MG/ML IJ SOLN
1.0000 mg | INTRAMUSCULAR | Status: DC | PRN
  Administered 2013-08-09: 1 mg via INTRAVENOUS
  Administered 2013-08-11: 2 mg via INTRAVENOUS
  Filled 2013-08-05 (×2): qty 2

## 2013-08-05 MED ORDER — POLYETHYLENE GLYCOL 3350 17 G PO PACK
17.0000 g | PACK | Freq: Two times a day (BID) | ORAL | Status: DC
Start: 2013-08-05 — End: 2013-08-06
  Administered 2013-08-05: 17 g via ORAL
  Filled 2013-08-05 (×4): qty 1

## 2013-08-05 MED ORDER — HYDROMORPHONE HCL PF 1 MG/ML IJ SOLN: 1.0000 mg | INTRAMUSCULAR | Status: DC | PRN

## 2013-08-05 NOTE — H&P (Signed)
[name], [name] NO.:  [id number]  MEDICAL RECORD NO.:  [id number]  LOCATION:  2353                         FACILITY:  [facility]  PHYSICIAN:  Volanda Napoleon, M.D.  DATE OF BIRTH:  [birth year]  DATE OF ADMISSION:  07/30/2013 DATE OF DISCHARGE:                             HISTORY & PHYSICAL   REASON FOR ADMISSION: 1. Likely cyanosis of the feet secondary to arterial emboli. 2. Metastatic lung cancer.  HISTORY OF PRESENT ILLNESS:  [name] is a very nice 58 year old white female.  She has metastatic adenocarcinoma of the lung.  She is positive for the EGFR mutation.  She presented 2 years ago.  She has been through all chemotherapy.  She was admitted over July 4 weekend and had bilateral pleural effusions that required chest tubes to be placed.  She is part of hospice.  She began to have progressive cyanosis of the toes over the weekend. She had more pain.  Hospice came out to see her.  They called our office.  We got her in.  She has clear cyanosis of the toes, worse on the right side than left side.  The bottom of her feet are quite purple. Her toes are cool.  She does have some pulses in the dorsalis pedis arteries.  Again, I am worried about emboli showering to the feet.  In the office, we did give her 1 dose of Lovenox.  This was 100 mg.  I am not sure when she will be able to start heparin.  I felt like she had to be hospitalized so we could initiate heparin and see if we can improve the cyanosis.  She is not eating much.  She really is not all that active.  Her performance status is ECOG 3.  She is not short of breath.  Again, she has the PleurX catheters in bilaterally.  She has not noted any fever.  She has had no obvious bleeding.  There has been no change in bowel or bladder habits.  She has had no problems with her hands.  Again, I felt that we needed to admit her so that we could evaluate this cyanosis and anticoagulate  her.  PAST MEDICAL HISTORY:  As stated previously.  ALLERGIES:  None.  HOME MEDICATIONS:  Xanax 0.25-0.5 mg q.6 hours p.r.n., Decadron 4 mg p.o. b.i.d., Marinol 2.5 mg p.o. b.i.d., Diflucan 100 mg p.o. daily, folic acid, hydrocodone (10/325) 1 p.o. q.6 hours p.r.n. pain, Imodium 2 mg p.o. q.i.d. p.r.n., Ativan 0.5 mg p.o. at bedtime, Ritalin 10 mg p.o. b.i.d., metoprolol 50 mg p.o. b.i.d., Compazine 10 mg p.o. q.6 hours p.r.n.  SOCIAL HISTORY:  Remarkable for no tobacco use.  There is no alcohol use.  REVIEW OF SYSTEMS:  As stated in the history of present illness.  PHYSICAL EXAMINATION:  GENERAL:  This is a chronically ill appearing white female, in no obvious distress. VITAL SIGNS:  Temperature of 97.9, pulse 117, respiratory rate 16, blood pressure 124/78, oxygen saturation 93%. HEAD AND NECK:  Shows no ocular or oral lesions.  She has some temporal muscle wasting.  She has some alopecia from radiation therapy to the brain for cranial mets.  She has  no thrush.  There is no adenopathy in the neck. LUNGS:  Decreased breath sounds over on the left side.  Right lung field has decent breath sounds. CARDIAC:  Tachycardia but regular.  She has no murmurs, rubs, or bruits. ABDOMEN:  Soft.  She has good bowel sounds.  There is no fluid wave. There is no palpable liver or spleen tip. BACK:  No tenderness over the spine. EXTREMITIES:  Cyanosis of the toes.  She has coolness of the toes.  She has very little movement of the toes bilaterally.  Dorsum of her feet are warm.  She has the dorsalis pedis pulses.  She has no swelling in the legs. SKIN:  Shows no rashes ecchymoses or petechia.  Again, she does have cyanosis on the toes. NEUROLOGICAL:  No obvious neurological deficits.  LABORATORY DATA:  All pending.  IMPRESSION:  [name] is a 58 year old female with metastatic lung cancer.  She has adenocarcinoma.  She is being admitted because of the cyanosis of her toes.  Again, I  worry about emboli being showered to the distal arteries in the feet.  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  We may need to get an echocardiogram on her.  She is hypercoagulable from her underlying cancer.  I gave her a dose of Lovenox in the office, I was not sure when she will be able to start heparin in the hospital.  Pharmacy will manage the heparin.  I will put her on 1 full dose of aspirin.  We will have to get arterial Dopplers of her feet.  We will get these tomorrow.  Everything, for [name], is comfort care.  We want quality of life. Hopefully, we can improve her situation with anticoagulation.  We will see what her lab work shows.  She is a do not resuscitate.  We talked to her about this with her last hospitalization.  Hospice is following her.  Again, she wishes to have quality of life and comfort. She has a Port-A-Cath in.  We accessed the Port-A-Cath when she was in the office.     Volanda Napoleon, M.D.     PRE/MEDQ  D:  07/22/2013  T:  08/05/2013  Job:  [id number]

## 2013-08-05 NOTE — Progress Notes (Signed)
I suspect that we have a big problem. With her lab work, she was anemic and thrombocytopenic. This was totally different than just 2 weeks ago.  I highly suspect that she now has bone marrow involvement by her malignancy. 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 This is a incredibly ominous complication of cancer. Once this happens, the prognosis is easily less than one month.  I will need to look at her blood smear.  Today, her hemoglobin is down to 6.4. I will give her 2 units of blood. This may help with perfusion down to her feet.  She still has pain in her feet. She still has cyanosis in her toes. The dorsum of her feet are nice and warm. The bottom of her feet are mottled.  She does not have any cough. There is no shortness of breath. She is a little constipated. I will and MiraLAX.  She's had no obvious bleeding.  Overall, her performance status is ECOG 3.  Her physical exam shows her vital signs are temperature of 98.1. Pulse 88. Blood pressure 124/73. Oxygen saturation is 94%. Head and neck exam shows no oral lesions. There is no petechia. I see no adenopathy or neck. Lungs are clear. Cardiac exam is regular in rhythm with no murmurs rubs or bruits. Abdomen is soft. She is no palpable liver or spleen tip. Bowel sounds are slightly decreased. Extremities shows a cyanosis in her toes. She has limited range of motion of her toes. There is some tenderness to palpation. Neurological exam is non-focal.  Again, our goal is comfort. I want to make sure that we can't do what we can for her comfort. The change in her blood counts is very ominous in my mind. I think this further confirms the aggressiveness of her malignancy.  I really think that her prognosis is easily less than 3-4 weeks.  We will continue to focus on her comfort.  I very much appreciate the outstanding care that she is getting by the staff on [address].!!!  Lum Keas.  Romans 5:3-5

## 2013-08-05 NOTE — Progress Notes (Signed)
ANTICOAGULATION CONSULT NOTE - Follow Up Consult  Pharmacy Consult for Heparin Indication: Arterial emboli to toes  No Known Allergies  Patient Measurements: Height: 5\' 2"  (157.5 cm) Weight: 131 lb (59.421 kg) IBW/kg (Calculated) : 50.1 Heparin Dosing Weight: 59.4 kg  Vital Signs: Temp: 98 F (36.7 C) (07/21 1621) Temp src: Oral (07/21 1621) BP: 126/83 mmHg (07/21 1621) Pulse Rate: 101 (07/21 1621)  Labs:  Recent Labs  08/02/2013 1900 08/05/13 0530 08/05/13 1400  HGB 7.2* 6.4*  --   HCT 21.9* 20.0*  --   PLT 37* 48*  --   APTT 64*  --   --   LABPROT 19.0*  --   --   INR 1.59*  --   --   HEPARINUNFRC  --   --  1.30*  CREATININE 1.49*  --   --     Estimated Creatinine Clearance: 32.2 ml/min (by C-G formula based on Cr of 1.49).   Medical History: Past Medical History  Diagnosis Date  . Pulmonary nodules 06/22/11    per CT scan  . IBS (irritable bowel syndrome)   . Fibroadenoma of breast     hx of  . Hx of radiation therapy 07/05/11 -07/21/11    LS, Tspine  . Hx of radiation therapy  completed7/3/13    brain, SRS  . Headache(784.0)   . Compression fracture     Multiple - Kyphoplasty  . Status post chemotherapy planned start 07/24/12    Alimta/carboplatin/Avastin / Prior chemotherapy Tarceva and Afatinib ,  . S/P radiation therapy 07/19/11     stereotactic radiosurgery to the right frontal, right thalamic, left frontal and left occipital target, 20 Gray in 1 fraction to each target  . Brain lesion 06/2712    New Lesions -3 mm, left mid cerebellum, 5 mm, left medial temporal lobe, 4 mm, right superior cerebellum, 3 mm, right anterior temporal lobe, 2 mm, left parietal cortex   . S/P radiation therapy  07/26/2012     20 Gy to Left temporal, Left cerebellar,Left posterior superior frontal, Right anterior temporal, Right superior vermian, Left occipital       . Hx of radiation therapy 10/21/12    SRS brain  . Shingles 01/02/13  . Postmenopausal HRT (hormone replacement  therapy)     10/18/2005 - 12/2011  . Metastasis of unknown origin   . Adenocarcinoma of lung Dx'd 06/2011  . 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 Breast cancer 01/04/12  . Metastatic lung cancer     Epidermal Growth Factor Positive - Afatinib  . Breast cancer 01/29/2013  . S/P radiation therapy  12/30/2012    Right Frontal and Right Temoporal lobes  . S/P radiation therapy 04/29/2013-05/14/2013    Sacral Lesion  . S/P radiation therapy 06/16/2013-07/03/2013    Whole brain / 35 Gy in 14 fractions    Medications:  Scheduled:  . aspirin EC  325 mg Oral Daily  . dexamethasone  4 mg Oral Daily  . dronabinol  2.5 mg Oral BID AC  . fentaNYL  12.5 mcg Transdermal Q72H  . fluconazole  100 mg Oral Daily  . folic acid  1 mg Oral Daily  . LORazepam  0.5 mg Oral QHS  . methylphenidate  10 mg Oral BID WC  . metoprolol  50 mg Oral BID  . polyethylene glycol  17 g Oral BID   Infusions:  . sodium chloride 50 mL/hr at 07/19/2013 2000  .  sodium chloride 500 mL/hr at 08/08/2013 1919  . heparin     PRN: ALPRAZolam, HYDROcodone-acetaminophen, HYDROmorphone (DILAUDID) injection, lidocaine-prilocaine, loperamide, ondansetron, prochlorperazine  Assessment: 58 yo female with metastatic lung cancer admitted from office due to concern for arterial emboli to the toes. Pharmacy consulted to dose IV heparin.   Per MD, she received lovenox 100mg  (1.6 mg/kg) SQ at 4pm 7/20 so IV heparin infusion was started at 6AM 7/21 to allow clearance of large Lovenox dose.    Hgb down to 6.4 and patient receiving blood.  Continue IV heparin administration per MD.  First heparin level was delayed due to blood administration.  Level drawn between units was SUPRAtherapeutic.    Goal of Therapy:  Heparin level 0.3-0.7 units/ml Monitor platelets by anticoagulation protocol: Yes   Plan:  1.  HOLD heparin infusion x 1 hour then resume at lower rate of 800 units/hr.   This was communicated to RN. 2.  Recheck heparin level tomorrow at 0200.  Hershal Coria, PharmD, BCPS Pager: [phone] 08/05/2013 6:16 PM

## 2013-08-05 NOTE — Progress Notes (Signed)
Nutrition Brief Note   Patient identified as positive Malnutrition Screening Tool Chart reviewed. Per MD, pt with focus of comfort care.   No nutrition interventions warranted at this time.  Please consult as needed.   Kimberly Abide MS RD LDN Clinical Dietitian [phone]

## 2013-08-05 NOTE — Progress Notes (Signed)
Pt was lying in bed when I arrived; awake. Pt's husband was bedside. Pt said she was doing ok. She seemed tired, so I kept the visit brief.  She and husband were thankful for visit.  Please call if follow-up spt is needed.  [place]  08/05/13 0900  Clinical Encounter Type  Visited With Patient

## 2013-08-05 NOTE — Progress Notes (Signed)
CRITICAL VALUE ALERT  Critical value received: hgb 6.4  Date of notification:08/05/13  Time of notification:  0615  Critical value read back:yes  Nurse who received alert: Lottie Dawson  MD notified (1st page): Burney Gauze , MD  Time of first page:  0630  MD notified (2nd page):  Time of second page:  Responding MD:  Burney Gauze, MD  Time MD responded:  0700

## 2013-08-05 NOTE — Progress Notes (Addendum)
Inpatient RN visit-[facility] [place] 3W Room 1332 -[facility] RN Visit-Karen Alford Highland RN  Related noncovered admission to [facility] diagnosis of Lung Ca, w/mets to brain/bone. Pt is DNR code.     Pt seen at bedside, lying in bed, husband Rollin at bedside. Pt appears sleepy, but able to interact with writer during visit. She reports she still has " a little pain " in her toes. She is currently on a heparin gtt via a PIV to her left hand. She denied any shortness of breath or other discomfort. Reports poor appetite, which Dalbert Mayotte shared was present prior to admission. Per staff RN Legrand Rams and chart review, pt to receive blood today for a Hb of 6.4. Emotional support offered. Rollin wanted to be sure that home care RN was notified of patient's admission, assured him that homecare team is aware of the admission. Writer had communication with [facility] SW Marilynne Halsted, who plans to visit pt today. [facility] will continue to follow through discharge.  Patient's home medication list and transfer summary in place on shadow chart.   Please call [facility] @ [phone]-with any hospice needs.   Thank you. Tracey Harries, RN  [facility]  Hospice Liaison  [phone])

## 2013-08-05 NOTE — Care Management Note (Addendum)
    Page 1 of 1   08/08/2013     1:49:24 PM CARE MANAGEMENT NOTE 08/08/2013  Patient:  [name], [name]   Account Number:  [id number]  Date Initiated:  08/05/2013  Documentation initiated by:  [facility]  Subjective/Objective Assessment:   adm: Arterial Emboli; bilaterial cyanosis of feet     Action/Plan:   discharge planing   Anticipated DC Date:  08/11/2013   Anticipated DC Plan:  [place]  CM consult      [facility] Choice  Resumption Of Svcs/PTA Provider   Choice offered to / List presented to:             [facility]   Status of service:  In process, will continue to follow Medicare Important Message given?   (If response is "NO", the following Medicare IM given date fields will be blank) Date Medicare IM given:   Medicare IM given by:   Date Additional Medicare IM given:   Additional Medicare IM given by:    Discharge Disposition:    Per UR Regulation:  Reviewed for med. necessity/level of care/duration of stay  If discussed at [place] of Stay Meetings, dates discussed:    Comments:  08/08/13 Gurshaan Matsuoka RN,BSN NCM [place] W/[facility].D/C PLANS TO RETURN HOME [facility].  08/05/13 11:00 CM notes pt is active with [facility].  [facility] aware.  Discharge plan is back w/[facility].  No other CM needs were communicated.  Mariane Masters, BSN, CM [phone].

## 2013-08-05 NOTE — Progress Notes (Signed)
[facility] MSW note: Joint visit with Carlus Pavlov, Chaplain. Pt is a current hospice home care patient. Pt has DNR. This is a related hospice admission. Pt reported that she was having a "little pain" in her feet. Pt open about her cancer getting worse. Pt does acknowledge that she get forgetful. Church pastor visited this am in follow up to hospice chaplain call. Both appreciative to chaplain providing additional spiritual support. Spouse aware of pt's changes and poor prognosis. Spouse feels he will not be able to care for pt at home in her growing care needs. Spouse would like to consider [place] if pt is medically eligible. MSW and chaplain provided emotional support. Please call with questions or concerns.   Marilynne Halsted, MSW [phone]

## 2013-08-06 DIAGNOSIS — R7401 Elevation of levels of liver transaminase levels: Secondary | ICD-10-CM

## 2013-08-06 DIAGNOSIS — I743 Embolism and thrombosis of arteries of the lower extremities: Secondary | ICD-10-CM

## 2013-08-06 DIAGNOSIS — R740 Nonspecific elevation of levels of transaminase and lactic acid dehydrogenase [LDH]: Secondary | ICD-10-CM

## 2013-08-06 LAB — CBC
HCT: 30.5 % — ABNORMAL LOW (ref 36.0–46.0)
Hemoglobin: 10.5 g/dL — ABNORMAL LOW (ref 12.0–15.0)
MCH: 31.5 pg (ref 26.0–34.0)
MCHC: 34.4 g/dL (ref 30.0–36.0)
MCV: 91.6 fL (ref 78.0–100.0)
PLATELETS: 44 10*3/uL — AB (ref 150–400)
RBC: 3.33 MIL/uL — AB (ref 3.87–5.11)
RDW: 17 % — ABNORMAL HIGH (ref 11.5–15.5)
WBC: 9.5 10*3/uL (ref 4.0–10.5)

## 2013-08-06 LAB — BASIC METABOLIC PANEL
ANION GAP: 15 (ref 5–15)
BUN: 34 mg/dL — ABNORMAL HIGH (ref 6–23)
CALCIUM: 7.8 mg/dL — AB (ref 8.4–10.5)
CO2: 23 meq/L (ref 19–32)
CREATININE: 1.17 mg/dL — AB (ref 0.50–1.10)
Chloride: 98 mEq/L (ref 96–112)
GFR calc Af Amer: 58 mL/min — ABNORMAL LOW (ref >90)
GFR, EST NON AFRICAN AMERICAN: 50 mL/min — AB (ref >90)
Glucose, Bld: 98 mg/dL (ref 70–99)
Potassium: 4.7 mEq/L (ref 3.7–5.3)
Sodium: 136 mEq/L — ABNORMAL LOW (ref 137–147)

## 2013-08-06 LAB — TYPE AND SCREEN
ABO/RH(D): O POS
ANTIBODY SCREEN: NEGATIVE
UNIT DIVISION: 0
UNIT DIVISION: 0

## 2013-08-06 LAB — HEPARIN LEVEL (UNFRACTIONATED)
HEPARIN UNFRACTIONATED: 0.92 [IU]/mL — AB (ref 0.30–0.70)
HEPARIN UNFRACTIONATED: 0.52 [IU]/mL (ref 0.30–0.70)
Heparin Unfractionated: 0.42 IU/mL (ref 0.30–0.70)

## 2013-08-06 MED ORDER — HEPARIN (PORCINE) IN NACL 100-0.45 UNIT/ML-% IJ SOLN
600.0000 [IU]/h | INTRAMUSCULAR | Status: DC
  Administered 2013-08-06 (×2): 600 [IU]/h via INTRAVENOUS
  Filled 2013-08-06: qty 250

## 2013-08-06 MED ORDER — SENNOSIDES-DOCUSATE SODIUM 8.6-50 MG PO TABS
2.0000 | ORAL_TABLET | Freq: Two times a day (BID) | ORAL | Status: DC
Start: 2013-08-06 — End: 2013-08-12
  Administered 2013-08-06 – 2013-08-10 (×7): 2 via ORAL
  Filled 2013-08-06 (×15): qty 2

## 2013-08-06 MED ORDER — DEXAMETHASONE 2 MG PO TABS
2.0000 mg | ORAL_TABLET | Freq: Every day | ORAL | Status: DC
  Administered 2013-08-06 – 2013-08-07 (×2): 2 mg via ORAL
  Filled 2013-08-06 (×2): qty 1

## 2013-08-06 NOTE — Progress Notes (Signed)
ANTICOAGULATION CONSULT NOTE - Follow Up Consult  Pharmacy Consult for Heparin Indication: Arterial emboli to toes with concern per Dr. Marin Olp for NBTE  No Known Allergies  Patient Measurements: Height: 5\' 2"  (157.5 cm) Weight: 131 lb 9.8 oz (59.7 kg) IBW/kg (Calculated) : 50.1 Heparin Dosing Weight: 59.4 kg  Vital Signs: Temp: 98.4 F (36.9 C) (07/22 1400) Temp src: Oral (07/22 1400) BP: 135/82 mmHg (07/22 1400) Pulse Rate: 101 (07/22 1400)  Labs:  Recent Labs  07/17/2013 1900 08/05/13 0530 08/05/13 1400 08/06/13 0200 08/06/13 1040  HGB 7.2* 6.4*  --  10.5*  --   HCT 21.9* 20.0*  --  30.5*  --   PLT 37* 48*  --  44*  --   APTT 64*  --   --   --   --   LABPROT 19.0*  --   --   --   --   INR 1.59*  --   --   --   --   HEPARINUNFRC  --   --  1.30* 0.92* 0.52  CREATININE 1.49*  --   --  1.17*  --     Estimated Creatinine Clearance: 40.9 ml/min (by C-G formula based on Cr of 1.17).   Infusions:  . sodium chloride 30 mL/hr at 08/06/13 1229  . heparin 600 Units/hr (08/06/13 0328)    Assessment: 58 yo female with metastatic lung cancer admitted from office due to concern for arterial emboli to the toes. Pharmacy consulted to dose IV heparin.  Recent lovenox 100mg  (1.6 mg/kg) SQ at 4pm 7/20.  IV heparin infusion started at 6AM 7/21.  CBC:  Hgb remains low but improved significantly post transfusion at 10.5.  Plt remain low/stable at 44.   RN reports no interruptions or complications.  She does note that IV site in [facility] leaks at times, but is not actively bleeding and other IV sites are extremely limited.  We will continue to monitor for bleeding or complications.  Heparin level = 0.42, remains therapeutic  (therapeutic x2 consecutive levels now) on Heparin drip at 600 units/hr.  Goal of Therapy:  Heparin level 0.3-0.7 units/ml Monitor platelets by anticoagulation protocol: Yes   Plan:   Continue current heparin IV infusion at 600 units/hr  Recheck heparin level  daily.   Gretta Arab PharmD, BCPS Pager [phone] 08/06/2013 4:23 PM

## 2013-08-06 NOTE — Progress Notes (Signed)
Heparin level  drawn at 0200.

## 2013-08-06 NOTE — Progress Notes (Signed)
[name] maybe a little bit better. She has not had her arterial Dopplers yet. However, her left foot looks better. There is not as much cyanosis in the lateral aspect of the left foot. The right foot continuity does still look quite cyanotic. She says that there is less pain. Hopefully, the fentanyl patch is helping. She still is not eating much. She's had no nausea vomiting. There is no cough or is no increased shortness of breath. There's been no fluid removed from her drainage catheters.  He really has not gotten out of bed. She has had no bleeding. She is on heparin. I think pharmacy very much for helping out with heparin. I also have on aspirin.  Her prealbumin is down to 19.7. It is going down. Her LDH is on the high side. I think this is reflective of marrow involvement by her malignancy.  Her vital signs are stable. Blood pressure is 130/82. Pulse is 101. Temperature 98.4. Oral exam is slightly dry. There is no bleeding. Lungs are with a decent sound bilaterally. Cardiac exam tachycardia regular. Abdomen is soft. She has no palpable liver or spleen. Extremities shows the cyanosis in the toes on the right foot. There is a cyanosis on the bottom of the right foot. Left foot shows decreased cyanosis in the fourth and fifth toes. There is not as much cyanosis in the first 3 toes. There is some areas of mottling on the bottom of the foot. Neurological exam is nonfocal.  We will continue the heparin for now. She is not bleeding. Despite her thrombo-cytopenia, I don't think that she is going to bleeding.  We will have to see how she responds. Again, we might have to consider [employer]. I talked to her husband about this.  We will continue to follow her labs.  We will continue to make changes with her pain medication.

## 2013-08-06 NOTE — Progress Notes (Signed)
ANTICOAGULATION CONSULT NOTE - Follow Up Consult  Pharmacy Consult for Heparin Indication: Arterial emboli to toes  No Known Allergies  Patient Measurements: Height: 5\' 2"  (157.5 cm) Weight: 131 lb (59.421 kg) IBW/kg (Calculated) : 50.1 Heparin Dosing Weight: 59.4 kg  Vital Signs: Temp: 98 F (36.7 C) (07/21 2300) Temp src: Oral (07/21 2300) BP: 128/72 mmHg (07/21 2300) Pulse Rate: 85 (07/21 2300)  Labs:  Recent Labs  07/23/2013 1900 08/05/13 0530 08/05/13 1400 08/06/13 0200  HGB 7.2* 6.4*  --  10.5*  HCT 21.9* 20.0*  --  30.5*  PLT 37* 48*  --  44*  APTT 64*  --   --   --   LABPROT 19.0*  --   --   --   INR 1.59*  --   --   --   HEPARINUNFRC  --   --  1.30* 0.92*  CREATININE 1.49*  --   --  1.17*    Estimated Creatinine Clearance: 40.9 ml/min (by C-G formula based on Cr of 1.17).   Medical History: Past Medical History  Diagnosis Date  . Pulmonary nodules 06/22/11    per CT scan  . IBS (irritable bowel syndrome)   . Fibroadenoma of breast     hx of  . Hx of radiation therapy 07/05/11 -07/21/11    LS, Tspine  . Hx of radiation therapy  completed7/3/13    brain, SRS  . Headache(784.0)   . Compression fracture     Multiple - Kyphoplasty  . Status post chemotherapy planned start 07/24/12    Alimta/carboplatin/Avastin / Prior chemotherapy Tarceva and Afatinib ,  . S/P radiation therapy 07/19/11     stereotactic radiosurgery to the right frontal, right thalamic, left frontal and left occipital target, 20 Gray in 1 fraction to each target  . Brain lesion 06/2712    New Lesions -3 mm, left mid cerebellum, 5 mm, left medial temporal lobe, 4 mm, right superior cerebellum, 3 mm, right anterior temporal lobe, 2 mm, left parietal cortex   . S/P radiation therapy  07/26/2012     20 Gy to Left temporal, Left cerebellar,Left posterior superior frontal, Right anterior temporal, Right superior vermian, Left occipital       . Hx of radiation therapy 10/21/12    SRS brain  . Shingles  01/02/13  . Postmenopausal HRT (hormone replacement therapy)     10/18/2005 - 12/2011  . Metastasis of unknown origin   . Adenocarcinoma of lung Dx'd 06/2011  . 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 Breast cancer 01/04/12  . Metastatic lung cancer     Epidermal Growth Factor Positive - Afatinib  . Breast cancer 01/29/2013  . S/P radiation therapy  12/30/2012    Right Frontal and Right Temoporal lobes  . S/P radiation therapy 04/29/2013-05/14/2013    Sacral Lesion  . S/P radiation therapy 06/16/2013-07/03/2013    Whole brain / 35 Gy in 14 fractions    Medications:  Scheduled:  . aspirin EC  325 mg Oral Daily  . dexamethasone  4 mg Oral Daily  . dronabinol  2.5 mg Oral BID AC  . fentaNYL  12.5 mcg Transdermal Q72H  . fluconazole  100 mg Oral Daily  . folic acid  1 mg Oral Daily  . LORazepam  0.5 mg Oral QHS  . methylphenidate  10 mg Oral BID WC  . metoprolol  50 mg Oral BID  . polyethylene glycol  17 g  Oral BID   Infusions:  . sodium chloride 50 mL/hr at 07/17/2013 2000  . sodium chloride 500 mL/hr at 08/01/2013 1919  . heparin     PRN: ALPRAZolam, HYDROcodone-acetaminophen, HYDROmorphone (DILAUDID) injection, lidocaine-prilocaine, loperamide, ondansetron, prochlorperazine  Assessment: 58 yo female with metastatic lung cancer admitted from office due to concern for arterial emboli to the toes. Pharmacy consulted to dose IV heparin.   Per MD, she received lovenox 100mg  (1.6 mg/kg) SQ at 4pm 7/20 so IV heparin infusion was started at 6AM 7/21 to allow clearance of large Lovenox dose.  1st HL = 1.30.   0200 HL= 0.91 (still supratherapeutic)  Slight bleeding around IV site reported per RN, no infusion problems.   Goal of Therapy:  Heparin level 0.3-0.7 units/ml Monitor platelets by anticoagulation protocol: Yes   Plan:  1. Decrease heparin drip to 600 units/hr 2.  Recheck heparin level tomorrow at 10 am.   Dorrene German 08/06/2013 3:26 AM

## 2013-08-06 NOTE — Progress Notes (Signed)
ANTICOAGULATION CONSULT NOTE - Follow Up Consult  Pharmacy Consult for Heparin Indication: Arterial emboli to toes with concern per Dr. Marin Olp for NBTE  No Known Allergies  Patient Measurements: Height: 5\' 2"  (157.5 cm) Weight: 131 lb 9.8 oz (59.7 kg) IBW/kg (Calculated) : 50.1 Heparin Dosing Weight: 59.4 kg  Vital Signs: Temp: 97.5 F (36.4 C) (07/22 0610) Temp src: Oral (07/22 0610) BP: 128/92 mmHg (07/22 0610) Pulse Rate: 102 (07/22 0610)  Labs:  Recent Labs  07/17/2013 1900 08/05/13 0530 08/05/13 1400 08/06/13 0200 08/06/13 1040  HGB 7.2* 6.4*  --  10.5*  --   HCT 21.9* 20.0*  --  30.5*  --   PLT 37* 48*  --  44*  --   APTT 64*  --   --   --   --   LABPROT 19.0*  --   --   --   --   INR 1.59*  --   --   --   --   HEPARINUNFRC  --   --  1.30* 0.92* 0.52  CREATININE 1.49*  --   --  1.17*  --     Estimated Creatinine Clearance: 40.9 ml/min (by C-G formula based on Cr of 1.17).   Medical History: Past Medical History  Diagnosis Date  . Pulmonary nodules 06/22/11    per CT scan  . IBS (irritable bowel syndrome)   . Fibroadenoma of breast     hx of  . Hx of radiation therapy 07/05/11 -07/21/11    LS, Tspine  . Hx of radiation therapy  completed7/3/13    brain, SRS  . Headache(784.0)   . Compression fracture     Multiple - Kyphoplasty  . Status post chemotherapy planned start 07/24/12    Alimta/carboplatin/Avastin / Prior chemotherapy Tarceva and Afatinib ,  . S/P radiation therapy 07/19/11     stereotactic radiosurgery to the right frontal, right thalamic, left frontal and left occipital target, 20 Gray in 1 fraction to each target  . Brain lesion 06/2712    New Lesions -3 mm, left mid cerebellum, 5 mm, left medial temporal lobe, 4 mm, right superior cerebellum, 3 mm, right anterior temporal lobe, 2 mm, left parietal cortex   . S/P radiation therapy  07/26/2012     20 Gy to Left temporal, Left cerebellar,Left posterior superior frontal, Right anterior temporal, Right  superior vermian, Left occipital       . Hx of radiation therapy 10/21/12    SRS brain  . Shingles 01/02/13  . Postmenopausal HRT (hormone replacement therapy)     10/18/2005 - 12/2011  . Metastasis of unknown origin   . Adenocarcinoma of lung Dx'd 06/2011  . 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 Breast cancer 01/04/12  . Metastatic lung cancer     Epidermal Growth Factor Positive - Afatinib  . Breast cancer 01/29/2013  . S/P radiation therapy  12/30/2012    Right Frontal and Right Temoporal lobes  . S/P radiation therapy 04/29/2013-05/14/2013    Sacral Lesion  . S/P radiation therapy 06/16/2013-07/03/2013    Whole brain / 35 Gy in 14 fractions    Medications:  Scheduled:  . aspirin EC  325 mg Oral Daily  . dexamethasone  2 mg Oral Daily  . dronabinol  2.5 mg Oral BID AC  . fentaNYL  12.5 mcg Transdermal Q72H  . fluconazole  100 mg Oral Daily  . LORazepam  0.5 mg Oral QHS  .  methylphenidate  10 mg Oral BID WC  . metoprolol  50 mg Oral BID  . senna-docusate  2 tablet Oral BID   Infusions:  . sodium chloride 30 mL/hr at 08/06/13 0814  . heparin 600 Units/hr (08/06/13 0328)   PRN: ALPRAZolam, HYDROcodone-acetaminophen, HYDROmorphone (DILAUDID) injection, lidocaine-prilocaine, loperamide, ondansetron, prochlorperazine  Assessment: 58 yo female with metastatic lung cancer admitted from office due to concern for arterial emboli to the toes. Pharmacy consulted to dose IV heparin.  Per MD, she received lovenox 100mg  (1.6 mg/kg) SQ at 4pm 7/20 so IV heparin infusion was started at 6AM 7/21 to allow clearance of large Lovenox dose.    Hgb improved after transfusion yesterday but was drastic increase.  Continue IV heparin administration per MD.  Heparin level now therapeutic after being supratherapeutic x 2 and 2 rate decreases.    Goal of Therapy:  Heparin level 0.3-0.7 units/ml Monitor platelets by anticoagulation protocol:  Yes   Plan:  1) Continue current heparin rate of 600 units/hr 2) Recheck heparin level in 6 hrs to confirm correct rate   Adrian Saran, PharmD, BCPS Pager [phone] 08/06/2013 11:06 AM

## 2013-08-06 NOTE — Progress Notes (Addendum)
Inpatient RN visit- [facility] 3W Room 1332-[facility] RN Visit-Karen Alford Highland RN  Related non covered admission to [facility] diagnosis of Lung Ca w/mets.  Pt is DNR code.     Pt seen at bedside, sitting up, alert and able to converse. Pt's husband and 2 son's present during visit. Family shared stories about living in [place]. Pt with swelling to L hand, hand cool to touch. PIV site changed to upper L arm for heparin infusion. Pt did report "a little " pain in her toes, staff RN Legrand Rams notified, pt declined pain medication when offered. Per chart review po intake remains poor. Pt continues on a heparin infusion.  No d/c plans at this time. [facility] will continue to monitor through discharge. Patient's home medication list and transfer summary in place on shadow chart.   Please call [facility] @ [phone]- with any hospice needs.   Thank you. Tracey Harries, RN  [facility]  Hospice Liaison  [phone])

## 2013-08-06 NOTE — Progress Notes (Signed)
VASCULAR LAB PRELIMINARY  ARTERIAL  ABI completed:    RIGHT    LEFT    PRESSURE WAVEFORM  PRESSURE WAVEFORM  BRACHIAL 143 Triphasic  BRACHIAL IV triphasic  DP   DP    AT 168 triphasic AT 165 triphasic  PT 179 triphasic PT 152 triphasic  PER   PER    GREAT TOE  flat GREAT TOE  flat    RIGHT LEFT  ABI >1.0 >1.0     Jacqulynn Shappell, RVT 08/06/2013, 10:01 AM

## 2013-08-07 ENCOUNTER — Inpatient Hospital Stay (HOSPITAL_COMMUNITY): Payer: BC Managed Care – PPO

## 2013-08-07 DIAGNOSIS — G609 Hereditary and idiopathic neuropathy, unspecified: Secondary | ICD-10-CM

## 2013-08-07 LAB — CBC
HCT: 30.7 % — ABNORMAL LOW (ref 36.0–46.0)
Hemoglobin: 10.3 g/dL — ABNORMAL LOW (ref 12.0–15.0)
MCH: 31.4 pg (ref 26.0–34.0)
MCHC: 33.6 g/dL (ref 30.0–36.0)
MCV: 93.6 fL (ref 78.0–100.0)
PLATELETS: 40 10*3/uL — AB (ref 150–400)
RBC: 3.28 MIL/uL — AB (ref 3.87–5.11)
RDW: 17.6 % — AB (ref 11.5–15.5)
WBC: 9.5 10*3/uL (ref 4.0–10.5)

## 2013-08-07 LAB — HEPARIN LEVEL (UNFRACTIONATED)
HEPARIN UNFRACTIONATED: 0.33 [IU]/mL (ref 0.30–0.70)
Heparin Unfractionated: 0.32 IU/mL (ref 0.30–0.70)

## 2013-08-07 MED ORDER — HEPARIN (PORCINE) IN NACL 100-0.45 UNIT/ML-% IJ SOLN
750.0000 [IU]/h | INTRAMUSCULAR | Status: DC
  Administered 2013-08-08: 650 [IU]/h via INTRAVENOUS
  Administered 2013-08-09: 750 [IU]/h via INTRAVENOUS
  Filled 2013-08-07 (×4): qty 250

## 2013-08-07 MED ORDER — HYDROMORPHONE HCL 2 MG PO TABS
1.0000 mg | ORAL_TABLET | ORAL | Status: DC | PRN
  Administered 2013-08-08 – 2013-08-10 (×4): 2 mg via ORAL
  Filled 2013-08-07 (×4): qty 1

## 2013-08-07 MED ORDER — GABAPENTIN 300 MG PO CAPS
300.0000 mg | ORAL_CAPSULE | Freq: Three times a day (TID) | ORAL | Status: DC
  Administered 2013-08-07 – 2013-08-10 (×11): 300 mg via ORAL
  Filled 2013-08-07 (×14): qty 1

## 2013-08-07 NOTE — Progress Notes (Signed)
CSW was following for potential residential hospice placement.  CSW received phone call from [place], [name] with updated.  Per [facility] SW, [name], [facility] SW was able to meet with pt husband alone today to discuss plan following hospitalization. Pt husband has made determination to have pt return home at discharge with [facility] and readdress potential for [facility] after pt returns home and pt family able to see how they are able to manage at home.   Full psychosocial assessment not warranted at this time as [facility] SW was able to communicate with pt husband as she had already been involved with pt from home.   No further social work needs identified at this time.   Please reconsult if social work needs arise.  Alison Murray, MSW, [place] Work [phone]

## 2013-08-07 NOTE — Progress Notes (Signed)
[facility] MSW note: Pt had a bed offer for [facility] for tomorrow. MSW met with spouse in private as family was in the room with pt. Spouse stated that he wanted to hold off on BP at this time. Spouse stated that he wants to take pt home after hospitalization to "say goodbye to the dogs and house". Spouse feels pt did not have this opportunity when she came into the hospital from MD office. Spouse then feels BP can be reassessed as she gets worse at home. MSW discussed options for additional care at home such as family assistance and hired sitters. MSW informed Vinnie Level, hospital social worker and Vernie Ammons, RN Director at [facility] of the spouse's decision. Please call with questions or concerns.   Marilynne Halsted, MSW [phone]

## 2013-08-07 NOTE — Progress Notes (Signed)
Dr. Marin Olp, could we please have orders for when to drain patient's aspira pleural catheters.  Patient was wondering when this would be resumed. Thanks, Azzie Glatter [place]

## 2013-08-07 NOTE — Progress Notes (Addendum)
Inpatient RN visit- Janika Jedlicka [facility] 3W Room 1332 -[facility] RN Visit-Karen Alford [place] RN  Related noncovered admission to [facility] diagnosis of Lung Ca w/mets.  Pt is DNR code.     Pt seen briefly at bed side as she had several visitors and did not want to engage with Probation officer. Pt appeared alert and interactive with company. Per chart review pt has had no po intake since admission and remains on the heparin gtt, with only slight improvement to her toes.  [facility] SW Marilynne Halsted is in communication with patient's husband about the option for a bed at [facility] ([facility]) [facility] will continue to follow through discharge. Patient's home medication list and transfer summary in place on shadow chart.   Please call [facility] @ [phone] with any hospice needs.   Thank you. Tracey Harries, RN  [facility]  Hospice Liaison  [phone])

## 2013-08-07 NOTE — Progress Notes (Signed)
CSW received referral for Residential Hospice Placement.  CSW reviewed chart and noted that per [facility] RN, Flo Shanks, [facility] SW Marilynne Halsted is in communication with patient's husband about the option for a bed at [facility] ([facility]).  CSW to await phone call from [place] regarding conversation with pt husband in order for this CSW to assist as appropriate. CSW to complete full psychosocial assessment at that time.  Alison Murray, MSW, [place] Work [phone]

## 2013-08-07 NOTE — Progress Notes (Signed)
Mrs. Klinkner is about the same. There really has been no change with respect to her feet with the cyanosis of her toes. There may be some slight improvement in the left foot. She continues on the heparin drip. I may want to change this over to something subcutaneous. There she is on aspirin. I think the combination of the 2 probably is necessary given that this is arterial in that there is underlying malignancy.  She still not eating. I just don't think that she will eat. I'm not sure if we really need the Marinol.  I think some of the pain that she is having is from neuropathic pain from nerve damage. I will try some gabapentin.  She's really not getting out of bed.  We will do a chest x-ray on her today to see if fluid is coming back in her lungs. If so, we can open her drainage catheters.  Her labs show her hemoglobin to be stable at 10.3. Her platelet count is stable at 40,000. Again, I don't think that there will be much risk of bleeding with her.  Her vital signs are about the same. Her physical exam is pretty much unchanged. She saw the cyanotic toes, more so on the right foot than on the left.  We will have to begin thinking about getting her over to [facility]. I want to continue the heparin for another couple days in hopes of trying to prevent gangrene from setting in.  Lum Keas

## 2013-08-07 NOTE — Progress Notes (Signed)
ANTICOAGULATION CONSULT NOTE - Follow Up  Pharmacy Consult for Heparin Indication: Arterial emboli to toes with concern per Dr. Marin Olp for NBTE  No Known Allergies  Patient Measurements: Height: 5\' 2"  (157.5 cm) Weight: 132 lb 4.4 oz (60 kg) IBW/kg (Calculated) : 50.1   Vital Signs: Temp: 97.5 F (36.4 C) (07/23 0623) Temp src: Axillary (07/23 0623) BP: 138/92 mmHg (07/23 0623) Pulse Rate: 93 (07/23 0623)  Labs:  Recent Labs  08/02/2013 1900 08/05/13 0530  08/06/13 0200  08/06/13 1615 08/07/13 0600 08/07/13 1227  HGB 7.2* 6.4*  --  10.5*  --   --  10.3*  --   HCT 21.9* 20.0*  --  30.5*  --   --  30.7*  --   PLT 37* 48*  --  44*  --   --  40*  --   APTT 64*  --   --   --   --   --   --   --   LABPROT 19.0*  --   --   --   --   --   --   --   INR 1.59*  --   --   --   --   --   --   --   HEPARINUNFRC  --   --   < > 0.92*  < > 0.42 0.33 0.32  CREATININE 1.49*  --   --  1.17*  --   --   --   --   < > = values in this interval not displayed.  Estimated Creatinine Clearance: 40.9 ml/min (by C-G formula based on Cr of 1.17).   Medications:  Infusions:  . sodium chloride 30 mL/hr at 08/06/13 1229  . heparin 650 Units/hr (08/07/13 2878)    Assessment: 58 yo female with metastatic lung cancer admitted from office due to concern for arterial emboli to the toes. Pharmacy consulted to dose IV heparin.  Recent lovenox 100mg  (1.6 mg/kg) SQ at 4pm 7/20. IV heparin infusion started at 6AM 7/21. Heparin level 0.32 is in the target range on 650 units/hr.  RN reports no interruptions or complications but there has been episodes of minor bleeding at the IV site in the [facility]. Will continue to follow this. CBC: H/H low but stable. Plt remain low/stable at 40.  Patient is on ASA 325mg  as PTA. Should this be held d/t thrombocytopenia?   Goal of Therapy:  Heparin level 0.3-0.7 units/ml Monitor platelets by anticoagulation protocol: Yes   Plan:   Cont heparin 650 units/hr.   F/u  heparin level in the am.   F/u CBC currently ordered daily.  Romeo Rabon, PharmD, pager [phone]. 08/07/2013,1:05 PM.

## 2013-08-07 NOTE — Progress Notes (Signed)
ANTICOAGULATION CONSULT NOTE - Follow Up Consult  Pharmacy Consult for Heparin Indication: Arterial emboli to toes with concern per Dr. Marin Olp for NBTE  No Known Allergies  Patient Measurements: Height: 5\' 2"  (157.5 cm) Weight: 132 lb 4.4 oz (60 kg) IBW/kg (Calculated) : 50.1 Heparin Dosing Weight:   Vital Signs: Temp: 97.5 F (36.4 C) (07/23 0623) Temp src: Axillary (07/23 0623) BP: 138/92 mmHg (07/23 0623) Pulse Rate: 93 (07/23 0623)  Labs:  Recent Labs  08/06/2013 1900 08/05/13 0530  08/06/13 0200 08/06/13 1040 08/06/13 1615 08/07/13 0600  HGB 7.2* 6.4*  --  10.5*  --   --  10.3*  HCT 21.9* 20.0*  --  30.5*  --   --  30.7*  PLT 37* 48*  --  44*  --   --  40*  APTT 64*  --   --   --   --   --   --   LABPROT 19.0*  --   --   --   --   --   --   INR 1.59*  --   --   --   --   --   --   HEPARINUNFRC  --   --   < > 0.92* 0.52 0.42 0.33  CREATININE 1.49*  --   --  1.17*  --   --   --   < > = values in this interval not displayed.  Estimated Creatinine Clearance: 40.9 ml/min (by C-G formula based on Cr of 1.17).   Medications:  Infusions:  . sodium chloride 30 mL/hr at 08/06/13 1229  . heparin 600 Units/hr (08/06/13 1849)  . heparin 650 Units/hr (08/07/13 1660)    Assessment: Patient with heparin at goal, but lower end of goal after trending down.  Goal of Therapy:  Heparin level 0.3-0.7 units/ml Monitor platelets by anticoagulation protocol: Yes   Plan:  Increase heparin to 650 units/hr, recheck at1300  Nani Skillern [place] 08/07/2013,6:40 AM

## 2013-08-08 ENCOUNTER — Encounter (HOSPITAL_COMMUNITY): Payer: Self-pay

## 2013-08-08 ENCOUNTER — Telehealth: Payer: Self-pay | Admitting: Hematology & Oncology

## 2013-08-08 ENCOUNTER — Ambulatory Visit: Payer: BC Managed Care – PPO | Admitting: Hematology & Oncology

## 2013-08-08 ENCOUNTER — Ambulatory Visit: Payer: BC Managed Care – PPO

## 2013-08-08 ENCOUNTER — Other Ambulatory Visit: Payer: BC Managed Care – PPO | Admitting: Lab

## 2013-08-08 DIAGNOSIS — C801 Malignant (primary) neoplasm, unspecified: Secondary | ICD-10-CM

## 2013-08-08 LAB — CBC
HCT: 30.6 % — ABNORMAL LOW (ref 36.0–46.0)
Hemoglobin: 10.3 g/dL — ABNORMAL LOW (ref 12.0–15.0)
MCH: 32.1 pg (ref 26.0–34.0)
MCHC: 33.7 g/dL (ref 30.0–36.0)
MCV: 95.3 fL (ref 78.0–100.0)
PLATELETS: 51 10*3/uL — AB (ref 150–400)
RBC: 3.21 MIL/uL — AB (ref 3.87–5.11)
RDW: 17.7 % — ABNORMAL HIGH (ref 11.5–15.5)
WBC: 10.2 10*3/uL (ref 4.0–10.5)

## 2013-08-08 LAB — COMPREHENSIVE METABOLIC PANEL
ALBUMIN: 1.7 g/dL — AB (ref 3.5–5.2)
ALT: 10 U/L (ref 0–35)
AST: 19 U/L (ref 0–37)
Alkaline Phosphatase: 181 U/L — ABNORMAL HIGH (ref 39–117)
Anion gap: 13 (ref 5–15)
BILIRUBIN TOTAL: 0.3 mg/dL (ref 0.3–1.2)
BUN: 29 mg/dL — AB (ref 6–23)
CHLORIDE: 101 meq/L (ref 96–112)
CO2: 22 mEq/L (ref 19–32)
CREATININE: 1.03 mg/dL (ref 0.50–1.10)
Calcium: 8 mg/dL — ABNORMAL LOW (ref 8.4–10.5)
GFR calc Af Amer: 68 mL/min — ABNORMAL LOW (ref >90)
GFR calc non Af Amer: 58 mL/min — ABNORMAL LOW (ref >90)
Glucose, Bld: 89 mg/dL (ref 70–99)
Potassium: 4.7 mEq/L (ref 3.7–5.3)
Sodium: 136 mEq/L — ABNORMAL LOW (ref 137–147)
TOTAL PROTEIN: 5.2 g/dL — AB (ref 6.0–8.3)

## 2013-08-08 LAB — HEPARIN LEVEL (UNFRACTIONATED)
HEPARIN UNFRACTIONATED: 0.31 [IU]/mL (ref 0.30–0.70)
HEPARIN UNFRACTIONATED: 0.32 [IU]/mL (ref 0.30–0.70)

## 2013-08-08 MED ORDER — FENTANYL 25 MCG/HR TD PT72
25.0000 ug | MEDICATED_PATCH | TRANSDERMAL | Status: DC
  Administered 2013-08-11: 25 ug via TRANSDERMAL
  Filled 2013-08-08: qty 1

## 2013-08-08 NOTE — Progress Notes (Signed)
Pt. Was able to void 250cc's of urine using a bedpan.  Pt. Was asked if she would like to have foley placed and pt. Declined stating that she did not want a foley to be placed.  Will continue to monitor.

## 2013-08-08 NOTE — Progress Notes (Signed)
ANTICOAGULATION CONSULT NOTE - Follow Up  Pharmacy Consult for Heparin Indication: Arterial emboli to toes with concern per Dr. Marin Olp for NBTE  No Known Allergies  Patient Measurements: Height: 5\' 2"  (157.5 cm) Weight: 135 lb 5.8 oz (61.4 kg) IBW/kg (Calculated) : 50.1   Vital Signs: Temp: 97.6 F (36.4 C) (07/24 0530) Temp src: Oral (07/24 0530) BP: 124/88 mmHg (07/24 0530) Pulse Rate: 94 (07/24 0530)  Labs:  Recent Labs  08/06/13 0200  08/07/13 0600 08/07/13 1227 08/08/13 0449  HGB 10.5*  --  10.3*  --  10.3*  HCT 30.5*  --  30.7*  --  30.6*  PLT 44*  --  40*  --  51*  HEPARINUNFRC 0.92*  < > 0.33 0.32 0.31  CREATININE 1.17*  --   --   --  1.03  < > = values in this interval not displayed.  Estimated Creatinine Clearance: 50.7 ml/min (by C-G formula based on Cr of 1.03).   Medications:  Infusions:  . sodium chloride 30 mL/hr at 08/06/13 1229  . heparin 650 Units/hr (08/08/13 2446)    Assessment: 58 yo female with metastatic lung cancer admitted from office due to concern for arterial emboli to the toes. Pharmacy consulted to dose IV heparin.  Recent lovenox 100mg  (1.6 mg/kg) SQ at 4pm 7/20. IV heparin infusion started at 6AM 7/21.  Heparin level remains therapeutic but trending down near the lower limit on 650 units/hr.  RN reports no interruptions or complications. No further bleeding at the IV site. Heparin is now infusing via the PAC.  CBC: H/H low but stable. Plts remain low but improved a little.  Cont on ASA 325mg  as PTA.  Goal of Therapy:  Heparin level 0.3-0.7 units/ml Monitor platelets by anticoagulation protocol: Yes   Plan:   Increase heparin conservatively to 750 units/hr.   F/u heparin level in 6 hours.   F/u CBC currently ordered daily.  Romeo Rabon, PharmD, pager [phone]. 08/08/2013,8:41 AM.

## 2013-08-08 NOTE — Progress Notes (Signed)
ANTICOAGULATION CONSULT NOTE - Heparin level Follow Up (Brief Note)  Pharmacy Consult for Heparin Indication: Arterial emboli to toes with concern per Dr. Marin Olp for NBTE  Assessment: 58 yo female with metastatic lung cancer admitted from office due to concern for arterial emboli to the toes. Pharmacy consulted to dose IV heparin.    Heparin gtt increased this am to 750 units/hr (from 650 units/hr) for heparin level trending down toward low-end goal. 6h heparin level following rate increase is 0.32  Goal of Therapy:  Heparin level 0.3-0.7 units/ml Monitor platelets by anticoagulation protocol: Yes  Plan:   Continue heparin at 750 units/hr at this time and fdollow-up with am labs  Labs:  Recent Labs  08/06/13 0200  08/07/13 0600 08/07/13 1227 08/08/13 0449 08/08/13 1540  HGB 10.5*  --  10.3*  --  10.3*  --   HCT 30.5*  --  30.7*  --  30.6*  --   PLT 44*  --  40*  --  51*  --   HEPARINUNFRC 0.92*  < > 0.33 0.32 0.31 0.32  CREATININE 1.17*  --   --   --  1.03  --   < > = values in this interval not displayed.  Estimated Creatinine Clearance: 50.7 ml/min (by C-G formula based on Cr of 1.03).   Doreene Eland, PharmD, BCPS.   Pager: [phone]    08/08/2013,4:41 PM.

## 2013-08-08 NOTE — Progress Notes (Signed)
[name] is an old more tired this morning. She is not hurting in her feet. She's not having any shortness of breath. We did do a chest x-ray on her yesterday. There was an increase in the left pleural effusion. We did go ahead and open up the Pleurx catheter. The nurse only got out 50cc of fluid.  I did start her on Neurontin yesterday. This was to help with any neuropathic component of pain that she may have.  She continues on heparin. Her left foot continues to improve nicely. There is no cyanosis on the bottom of her left foot. Her toes continue to improve and collar. Only the first toe has some cyanosis. The right foot has much less cyanosis on the bottom of the feet. There is still the cyanosis of her toes but this is not worse.  Her blood count is a little better. White count is 51,000. Her hemoglobin is 10.3.  She still not eating. I really don't think that this is going to be a factor.  She does not want a Foley catheter in place. She has some urinary retention. She did try to go to the bathroom on her on.  She's had no cough. There is no shortness of breath. There is no nausea or vomiting.  Her family made it up from [place]. Her son was with her today. He is very nice. It was nice to talk to him about [place].  On her physical exam, her vital signs show temperature of 97.6. Pulse 94. Blood pressure 124/88. Oxygen saturation is 99%.  Her head and neck exam shows no ocular or oral lesions. She has no palpable cervical or supraclavicular lymph nodes. Lungs are clear bilaterally. There may be some slight decrease of on the left side. No wheezes are noted. Cardiac exam regular in rhythm with no murmurs rubs or bruits. Abdomen is soft. She has decreased bowel sounds. There is no guarding or rebound tenderness. There is no palpable liver or spleen tip. Extremities shows some trace edema in her lower legs. Again there is some slight cyanosis in the first toe of the left foot. There is no  cyanosis on the bottom of her left foot. Right foot has the stable cyanotic changes of the distal foot, demarcating at the metacarpophalangeal junction. There is not as much cyanosis on the bottom of her right foot. She can move her toes. This is a little painful, more so on the right than left. Neurological exam shows no focal neurological deficits.  Her labs look okay. Her albumin is 1.7. BUN and creatinine 29 and 1.03.  I again, I am happy that her feet are not worsening. The heparin is working. IT we have to be patient with this for right now.  I want to keep her on the heparin over the weekend. We will then see about moving her over to [facility] place. I think this is going be best for her and her family.  I am not too worried about the lack of appetite. She is comfortable right now. She is on a fentanyl patch. She's not taking much in the way of breakthrough medication.  I still think that he probably are looking at no more than 3 weeks. She has developed microangiopathic hemolytic anemia of malignancy. Her blood smear is consistent with this. We just have to focus on her comfort.  I talked to her son. He understands the nature of her illness. He is thankful for the great care in that she  is getting by the staff on [address]

## 2013-08-08 NOTE — Progress Notes (Addendum)
Inpatient RN visit-[name] 3W Room 1332  -[facility] RN Visit-Kimberly [place] RN  Related noncovered admission to [facility] diagnosis of Lung Ca w/mets. Pt is DNR  code.  Pt not observed as she was using the bedpan. Pt's husband [name] outside of room and engaged Probation officer. He shared that the "heparin is working", that pt will remain in the hospital through the weekend on the heparin gtt. He also confirmed his plan to take his wife home before possible transition to [facility].  [name] also shared that pt's mother and brother along with his brother and sister in law have arrived from [place]. Son'sTyler and [name] also present in the hall way. Family all very supportive of each other and of the patient. Chart review confirms plan for patient to remain in the hospital through the weekend.  No new concerns voiced during conversation with [name] or from Diplomatic Services operational officer. [facility] will continue to follow through discharge.  Patient's home medication list and transfer summary in place  on shadow chart.   Please call [facility] @ [phone]-  with any hospice needs.   Thank you. Tracey Harries, RN  [facility]  Hospice Liaison  [phone])

## 2013-08-08 NOTE — Telephone Encounter (Signed)
Per Roxanne Gates and Md to cx 08/08/13 apt

## 2013-08-08 NOTE — Progress Notes (Signed)
Pt had not voided this shift. Pt attempted to void at 0630, but was unable to. Pt was bladder scanned which revealed a volume of 454. This Probation officer spoke with Dr. Marin Olp and received an order to place a foley for urinary retention and end of life care. Foley kit was brought to pt's room and procedure was explained to pt. Pt stated that "I would rather not have a foley at this time." Pt attempted to void again, but only voided 100 ml of malodorous yellow urine. Pt stated that she wanted to talk to Dr. Marin Olp before the foley is placed. Day nurse updated. Noreene Larsson RN, BSN

## 2013-08-08 NOTE — Progress Notes (Signed)
Per MD order Left plurex catheter was drained.  50cc's of brown blood tinged fluid was removed.  Pt. Tolerated procedure well.

## 2013-08-09 DIAGNOSIS — J90 Pleural effusion, not elsewhere classified: Secondary | ICD-10-CM

## 2013-08-09 LAB — CBC
HCT: 31.2 % — ABNORMAL LOW (ref 36.0–46.0)
HEMOGLOBIN: 10.2 g/dL — AB (ref 12.0–15.0)
MCH: 31.4 pg (ref 26.0–34.0)
MCHC: 32.7 g/dL (ref 30.0–36.0)
MCV: 96 fL (ref 78.0–100.0)
Platelets: 46 10*3/uL — ABNORMAL LOW (ref 150–400)
RBC: 3.25 MIL/uL — ABNORMAL LOW (ref 3.87–5.11)
RDW: 18 % — ABNORMAL HIGH (ref 11.5–15.5)
WBC: 9.7 10*3/uL (ref 4.0–10.5)

## 2013-08-09 LAB — HEPARIN LEVEL (UNFRACTIONATED)
HEPARIN UNFRACTIONATED: 0.37 [IU]/mL (ref 0.30–0.70)
Heparin Unfractionated: 0.41 IU/mL (ref 0.30–0.70)

## 2013-08-09 NOTE — Progress Notes (Signed)
[name]   DOB:[birth year]   YH#:[id number]    Subjective: Patient is sleeping. History is not possible. According to her family, she is comfortable with no pain. Her feet seems to be improving.   Objective:  Filed Vitals:   08/09/13 0420  BP: 110/60  Pulse: 103  Temp: 97.9 F (36.6 C)  Resp: 20     Intake/Output Summary (Last 24 hours) at 08/09/13 1411 Last data filed at 08/09/13 1200  Gross per 24 hour  Intake    720 ml  Output   1150 ml  Net   -430 ml    GENERAL: The patient is sleeping, comfortable SKIN: She had petechiae in her upper extremities and cyanotic fingers. NEURO: She appears comfortable   Labs:  Lab Results  Component Value Date   WBC 9.7 08/09/2013   HGB 10.2* 08/09/2013   HCT 31.2* 08/09/2013   MCV 96.0 08/09/2013   PLT 46* 08/09/2013   NEUTROABS 11.8* 07/25/2013    Lab Results  Component Value Date   NA 136* 08/08/2013   K 4.7 08/08/2013   CL 101 08/08/2013   CO2 22 08/08/2013    Studies:  Dg Chest Port 1 View  08/07/2013   CLINICAL DATA:  Shortness of breath. Pleural effusion. History of metastatic lung cancer.  EXAM: PORTABLE CHEST - 1 VIEW  COMPARISON:  Single view of the chest 08/09/2013 and 07/18/2013. CT chest 06/12/2013.  FINDINGS: Pleural drainage catheter remains in place bilaterally seen on the most recent study. There has been reaccumulation of a small to moderate left pleural effusion. Very small right pleural effusion is unchanged. Bilateral pulmonary masses and nodules are again identified. No pneumothorax is seen.  IMPRESSION: Bilateral pleural drainage catheters are in place. There has been increase in a small to moderate left pleural effusion. Tiny right effusion is unchanged.  Bilateral pulmonary masses and nodules consistent with metastatic lung cancer.   Electronically Signed   By: Inge Rise M.D.   On: 08/07/2013 14:18    Assessment & Plan:  #1 stage IV metastatic lung cancer Continue comfort care measures. Plan for  hospice per primary oncologist #2 bilateral pleural effusion She had drainage catheters in place #3 arterial emboli She is on IV heparin. We'll monitor for for signs of bleeding due to severe thrombocytopenia #4 severe thrombocytopenia She has no signs of bleeding. Recheck CBC tomorrow. Hold transfusion unless bleeding or platelet less than 10,000 #5 neuropathic pain Continue on Neurontin. She is also on fentanyl patch. Her pain appears to be under controlled. #6 CODE STATUS DNR/DNI   Anaiah Mcmannis, MD 08/09/2013  2:11 PM

## 2013-08-09 NOTE — Progress Notes (Signed)
ANTICOAGULATION CONSULT NOTE - Follow Up Consult  Pharmacy Consult for Heparin Indication: Arterial emboli to toes with concern per Dr. Marin Olp for NBTE  No Known Allergies  Patient Measurements: Height: 5\' 2"  (157.5 cm) Weight: 136 lb 3.9 oz (61.8 kg) IBW/kg (Calculated) : 50.1  Vital Signs: Temp: 97.9 F (36.6 C) (07/25 0420) Temp src: Axillary (07/25 0420) BP: 110/60 mmHg (07/25 0420) Pulse Rate: 103 (07/25 0420)  Labs:  Recent Labs  08/07/13 0600  08/08/13 0449 08/08/13 1540 08/09/13 0411  HGB 10.3*  --  10.3*  --  10.2*  HCT 30.7*  --  30.6*  --  31.2*  PLT 40*  --  51*  --  46*  HEPARINUNFRC 0.33  < > 0.31 0.32 0.41  CREATININE  --   --  1.03  --   --   < > = values in this interval not displayed.  Estimated Creatinine Clearance: 50.9 ml/min (by C-G formula based on Cr of 1.03).   Medications:  Infusions:  . sodium chloride 1,000 mL (08/09/13 0055)  . heparin 750 Units/hr (08/08/13 6195)    Assessment: 58 yo female with metastatic lung cancer admitted from office due to concern for arterial emboli to the toes. Pharmacy consulted to dose IV heparin.  Recent lovenox 100mg  (1.6 mg/kg) SQ at 4pm 7/20. IV heparin infusion started at 6AM 7/21. Heparin level = 0.37, therapeutic on 750 units/hr. RN reports no interruptions and no further bleeding at the IV site. Heparin is now infusing via the PAC.  Heparin levels are being drawn from peripheral IV site to avoid sticks. RN reports some petechiae and nosebleed last night, but no active bleeding now. CBC: Hgb 10.2 low but stable. Plts 46, decreased. Cont on ASA 325mg  as PTA.   Goal of Therapy:  Heparin level 0.3-0.7 units/ml Monitor platelets by anticoagulation protocol: Yes   Plan:   Continue heparin IV infusion at 750 units/hr (7.5 ml/hr)  Daily heparin level and CBC  Continue to monitor H&H and platelets   Gretta Arab PharmD, BCPS Pager [phone] 08/09/2013 11:44 AM

## 2013-08-09 NOTE — Progress Notes (Signed)
ANTICOAGULATION CONSULT NOTE - Follow Up Consult  Pharmacy Consult for Heparin Indication: Arterial emboli to toes with concern per Dr. Marin Olp for NBTE  No Known Allergies  Patient Measurements: Height: 5\' 2"  (157.5 cm) Weight: 135 lb 5.8 oz (61.4 kg) IBW/kg (Calculated) : 50.1 Heparin Dosing Weight:   Vital Signs: Temp: 97.9 F (36.6 C) (07/25 0420) Temp src: Axillary (07/25 0420) BP: 110/60 mmHg (07/25 0420) Pulse Rate: 103 (07/25 0420)  Labs:  Recent Labs  08/07/13 0600  08/08/13 0449 08/08/13 1540 08/09/13 0411  HGB 10.3*  --  10.3*  --  10.2*  HCT 30.7*  --  30.6*  --  31.2*  PLT 40*  --  51*  --  46*  HEPARINUNFRC 0.33  < > 0.31 0.32 0.41  CREATININE  --   --  1.03  --   --   < > = values in this interval not displayed.  Estimated Creatinine Clearance: 50.7 ml/min (by C-G formula based on Cr of 1.03).   Medications:  Infusions:  . sodium chloride 1,000 mL (08/09/13 0055)  . heparin 750 Units/hr (08/08/13 4431)    Assessment: Patient with heparin level at goal.   No issues noted.  Goal of Therapy:  Heparin level 0.3-0.7 units/ml Monitor platelets by anticoagulation protocol: Yes   Plan:  Continue heparin drip at current rate Recheck level at [address], [place] 08/09/2013,6:10 AM

## 2013-08-09 NOTE — Progress Notes (Signed)
[facility] MSW note: This is related, noncovered admission. Pt is a DNR. Pt was surrounded by her spouse, 2 sons, mother and brother. Pt was eating bites if dinner during visit. Pt hoping to go home soon. Spouse discussed that the plans remains to take pt home at hospital discharge and then reassess for [facility] when she and spouse are ready. MSW discussed case with Harden Mo, staff RN. MSW provided emotional support and active listening. Plase call with questions or concerns.   Marilynne Halsted, MSW  [phone]

## 2013-08-10 LAB — CBC
HCT: 29.8 % — ABNORMAL LOW (ref 36.0–46.0)
HEMOGLOBIN: 9.9 g/dL — AB (ref 12.0–15.0)
MCH: 31.7 pg (ref 26.0–34.0)
MCHC: 33.2 g/dL (ref 30.0–36.0)
MCV: 95.5 fL (ref 78.0–100.0)
Platelets: 50 10*3/uL — ABNORMAL LOW (ref 150–400)
RBC: 3.12 MIL/uL — ABNORMAL LOW (ref 3.87–5.11)
RDW: 17.8 % — ABNORMAL HIGH (ref 11.5–15.5)
WBC: 10.4 10*3/uL (ref 4.0–10.5)

## 2013-08-10 LAB — HEPARIN LEVEL (UNFRACTIONATED)
HEPARIN UNFRACTIONATED: 0.27 [IU]/mL — AB (ref 0.30–0.70)
HEPARIN UNFRACTIONATED: 0.21 [IU]/mL — AB (ref 0.30–0.70)

## 2013-08-10 MED ORDER — HEPARIN (PORCINE) IN NACL 100-0.45 UNIT/ML-% IJ SOLN
800.0000 [IU]/h | INTRAMUSCULAR | Status: DC
  Filled 2013-08-10: qty 250

## 2013-08-10 MED ORDER — HEPARIN (PORCINE) IN NACL 100-0.45 UNIT/ML-% IJ SOLN
950.0000 [IU]/h | INTRAMUSCULAR | Status: DC
  Administered 2013-08-10: 950 [IU]/h via INTRAVENOUS
  Filled 2013-08-10: qty 250

## 2013-08-10 NOTE — Progress Notes (Signed)
[name]   DOB:Jun 13, 1955   GE#:[id number]    Subjective: She is feeling well today. Nursing staff reported bilateral epistaxis, resolved subsequently after heparin was held for 2 hours. She has pain in her toes, currently controlled with current pain medication. Denies nausea.  Objective:  Filed Vitals:   08/10/13 1459  BP: 102/58  Pulse: 101  Temp: 97.7 F (36.5 C)  Resp: 16     Intake/Output Summary (Last 24 hours) at 08/10/13 1504 Last data filed at 08/10/13 1500  Gross per 24 hour  Intake      0 ml  Output    750 ml  Net   -750 ml    GENERAL:alert, no distress and comfortable SKIN: Significant peripheral cyanosis with digital necrosis in feet, mild rash in her hands with mild petechiae EYES: normal, Conjunctiva are pale and non-injected, sclera clear OROPHARYNX:no exudate, no erythema and lips, buccal mucosa, and tongue normal  NECK: supple, thyroid normal size, non-tender, without nodularity LYMPH:  no palpable lymphadenopathy in the cervical, axillary or inguinal LUNGS: clear to auscultation and percussion with normal breathing effort HEART: Tachycardia with no murmurs and bilateral lower extremity edema ABDOMEN:abdomen soft, non-tender and normal bowel sounds Musculoskeletal:no cyanosis of digits and no clubbing  NEURO: alert & oriented x 3 with fluent speech, no focal motor/sensory deficits   Labs:  Lab Results  Component Value Date   WBC 10.4 08/10/2013   HGB 9.9* 08/10/2013   HCT 29.8* 08/10/2013   MCV 95.5 08/10/2013   PLT 50* 08/10/2013   NEUTROABS 11.8* 07/25/2013    Lab Results  Component Value Date   NA 136* 08/08/2013   K 4.7 08/08/2013   CL 101 08/08/2013   CO2 22 08/08/2013   Assessment & Plan:  #1 stage IV metastatic lung cancer Continue comfort care measures. Plan for hospice per primary oncologist #2 bilateral pleural effusion She had drainage catheters in place #3 arterial emboli She is on IV heparin. I held her heparin for 2 hours this  morning due to bleeding, since then, it had been restarted. #4 severe thrombocytopenia Recheck CBC tomorrow. She had brief episode of bleeding, subsequently resolved spontaneously.  Hold transfusion unless recurrence of bleeding or platelet less than 10,000 #5 neuropathic pain Continue on Neurontin. She is also on fentanyl patch. Her pain appears to be under controlled.  #6 epistaxis This is likely due to dry mucous membranes from continuous oxygen therapy. I have added humidifier.  #7 CODE STATUS DNR/DNI    Kimberly Groll, MD 08/10/2013  3:04 PM

## 2013-08-10 NOTE — Progress Notes (Signed)
ANTICOAGULATION CONSULT NOTE - Follow Up  Pharmacy Consult for Heparin Indication: Arterial emboli to toes with concern per Dr. Marin Olp for NBTE  No Known Allergies  Patient Measurements: Height: 5\' 2"  (157.5 cm) Weight: 136 lb 3.9 oz (61.8 kg) IBW/kg (Calculated) : 50.1  Vital Signs: Temp: 97.7 F (36.5 C) (07/26 1459) Temp src: Oral (07/26 1459) BP: 102/58 mmHg (07/26 1459) Pulse Rate: 101 (07/26 1459)  Labs:  Recent Labs  08/08/13 0449  08/09/13 0411 08/09/13 1110 08/10/13 0500 08/10/13 1816  HGB 10.3*  --  10.2*  --  9.9*  --   HCT 30.6*  --  31.2*  --  29.8*  --   PLT 51*  --  46*  --  50*  --   HEPARINUNFRC 0.31  < > 0.41 0.37 0.27* 0.21*  CREATININE 1.03  --   --   --   --   --   < > = values in this interval not displayed.  Estimated Creatinine Clearance: 50.9 ml/min (by C-G formula based on Cr of 1.03).   Medications:  Infusions:  . sodium chloride 30 mL/hr at 08/10/13 1020  . heparin 800 Units/hr (08/10/13 1215)    Assessment: 58 yo female with metastatic lung cancer admitted from office due to concern for arterial emboli to the toes. Pharmacy consulted to dose IV heparin.  Lovenox 100mg  (1.6 mg/kg) SQ at 4pm 7/20. IV heparin infusion started at 6AM 7/21. Heparin level = 0.21, sub-therapeutic on 800 units/hr. RN reports no interruptions or infusion problems, no further epistaxis.  Cont on ASA 325mg  as PTA.  Goal of Therapy:  Heparin level 0.3-0.7 units/ml Monitor platelets by anticoagulation protocol: Yes   Plan:   Increase heparin IV infusion to 950 units/hr (conservative titration d/t thrombocytopenia)  Heparin level in 6 hours.  Daily heparin level and CBC  Continue to monitor H&H and platelets  Romeo Rabon, PharmD, pager [phone]. 08/10/2013,7:09 PM.

## 2013-08-10 NOTE — Progress Notes (Signed)
Due to patient having a nose bleed I discussed with Alvy Bimler MD proper interventions. With her orders, we stopped heparin for 2 hours (starting at 1015) and restarted at 1215. Continuing to monitor patient.

## 2013-08-10 NOTE — Progress Notes (Signed)
DNR.  Related admission, non-covered.  Patient in the bed awake and verbal.  Reports pain to her feet and has requested pain medication.  Patient with multiple family members at the bedside.  Chart reviewed and spoke with Hoyle Sauer, RN.  Please contact [facility] with any questions/concerns or discharge plans.  Vance Gather, RN [facility]

## 2013-08-10 NOTE — Progress Notes (Signed)
ANTICOAGULATION CONSULT NOTE - Follow Up Consult  Pharmacy Consult for Heparin Indication: Arterial emboli to toes with concern per Dr. Marin Olp for NBTE  No Known Allergies  Patient Measurements: Height: 5\' 2"  (157.5 cm) Weight: 136 lb 3.9 oz (61.8 kg) IBW/kg (Calculated) : 50.1  Vital Signs: Temp: 97.6 F (36.4 C) (07/26 0414) Temp src: Oral (07/26 0414) BP: 128/62 mmHg (07/26 0414) Pulse Rate: 105 (07/26 0414)  Labs:  Recent Labs  08/08/13 0449  08/09/13 0411 08/09/13 1110 08/10/13 0500  HGB 10.3*  --  10.2*  --  9.9*  HCT 30.6*  --  31.2*  --  29.8*  PLT 51*  --  46*  --  50*  HEPARINUNFRC 0.31  < > 0.41 0.37 0.27*  CREATININE 1.03  --   --   --   --   < > = values in this interval not displayed.  Estimated Creatinine Clearance: 50.9 ml/min (by C-G formula based on Cr of 1.03).   Medications:  Infusions:  . sodium chloride 1,000 mL (08/09/13 0055)  . heparin 750 Units/hr (08/09/13 1655)    Assessment: 58 yo female with metastatic lung cancer admitted from office due to concern for arterial emboli to the toes. Pharmacy consulted to dose IV heparin.  Recent lovenox 100mg  (1.6 mg/kg) SQ at 4pm 7/20. IV heparin infusion started at 6AM 7/21. Heparin level = 0.27, sub-therapeutic on 750 units/hr. CBC: Hgb 9.9, Plt 50. RN reports no interruptions or infusion problems, no bruising or bleeding, but noted petechia, same as yesterday, MD is aware. Cont on ASA 325mg  as PTA.   Goal of Therapy:  Heparin level 0.3-0.7 units/ml Monitor platelets by anticoagulation protocol: Yes   Plan:   Increase to heparin IV infusion at 800 units/hr (conservative titration d/t thrombocytopenia)  Heparin level in 6 hours.  Daily heparin level and CBC  Continue to monitor H&H and platelets   Gretta Arab PharmD, BCPS Pager [phone] 08/10/2013 7:07 AM

## 2013-08-11 ENCOUNTER — Encounter (HOSPITAL_COMMUNITY): Payer: Self-pay | Admitting: *Deleted

## 2013-08-11 DIAGNOSIS — R0602 Shortness of breath: Secondary | ICD-10-CM

## 2013-08-11 LAB — CBC
HEMATOCRIT: 25.8 % — AB (ref 36.0–46.0)
HEMOGLOBIN: 8.8 g/dL — AB (ref 12.0–15.0)
MCH: 32.7 pg (ref 26.0–34.0)
MCHC: 34.1 g/dL (ref 30.0–36.0)
MCV: 95.9 fL (ref 78.0–100.0)
Platelets: 45 10*3/uL — ABNORMAL LOW (ref 150–400)
RBC: 2.69 MIL/uL — ABNORMAL LOW (ref 3.87–5.11)
RDW: 17.5 % — AB (ref 11.5–15.5)
WBC: 9.5 10*3/uL (ref 4.0–10.5)

## 2013-08-11 LAB — HEPARIN LEVEL (UNFRACTIONATED)
Heparin Unfractionated: 0.28 IU/mL — ABNORMAL LOW (ref 0.30–0.70)
Heparin Unfractionated: 0.42 IU/mL (ref 0.30–0.70)
Heparin Unfractionated: 0.37 IU/mL (ref 0.30–0.70)

## 2013-08-11 MED ORDER — HYDROMORPHONE HCL PF 2 MG/ML IJ SOLN
2.0000 mg | INTRAMUSCULAR | Status: DC | PRN
Start: 2013-08-11 — End: 2013-08-12

## 2013-08-11 MED ORDER — MORPHINE (PF) INJECTION FOR INHALATION 10 MG/ML
15.0000 mg | RESPIRATORY_TRACT | Status: DC | PRN
  Administered 2013-08-11 – 2013-08-12 (×3): 15 mg via RESPIRATORY_TRACT
  Filled 2013-08-11 (×3): qty 2

## 2013-08-11 MED ORDER — PROCHLORPERAZINE EDISYLATE 5 MG/ML IJ SOLN: 10.0000 mg | Freq: Four times a day (QID) | INTRAMUSCULAR | Status: DC | PRN

## 2013-08-11 MED ORDER — HEPARIN (PORCINE) IN NACL 100-0.45 UNIT/ML-% IJ SOLN
1000.0000 [IU]/h | INTRAMUSCULAR | Status: DC
  Administered 2013-08-11: 1000 [IU]/h via INTRAVENOUS
  Filled 2013-08-11 (×3): qty 250

## 2013-08-11 MED ORDER — VITAMINS A & D EX OINT
TOPICAL_OINTMENT | CUTANEOUS | Status: AC
  Administered 2013-08-11: 17:00:00
  Filled 2013-08-11: qty 5

## 2013-08-11 NOTE — Progress Notes (Signed)
Mrs. Vecchio clearly looks weaker. She just looks different than when we last saw her on Friday. She is complaining of some shortness of breath. She is not having any obvious pain right now. She still has the cyanosis in the right toes.  I wonder if she is not getting acidotic now.  She's had a poor appetite. There's been no nausea or vomiting. She really has not been out of bed. She does not want a Foley catheter in.  She continues on the heparin infusion. I think we'll consider switching her over to something subcutaneous.  I will try her on a morphine nebulizer to help with her breathing.  I am going to to start cutting back her medications I don't think she really needs.  I talked to her son. I told him that I did not think that she is going to make it through the week. She just looks much more weak.  On her physical exam, she is still afebrile. She is tachycardic with a rate of 113. Blood pressure 118/84. Her lungs sound clear bilaterally. Cardiac exam tachycardic oregano. Abdomen is soft. Bowel sounds are slightly decreased. Extremities shows the cyanosis of the toes of the right foot.  Again, I think this will be the final declined for her. I really don't think she will make it through the week. I really don't think we need to move her to [facility].  I very much appreciate all the great care that she has gotten from the staff on [address].!!  Pete E.  2 Timothy 4:16-18

## 2013-08-11 NOTE — Progress Notes (Signed)
md stated to hold meds for now and as long as pt is comfortable no new intervention.

## 2013-08-11 NOTE — Progress Notes (Addendum)
Inpatient RN visit- Amylah Will [facility] [place] [facility] RN Visit-Karen Alford [place] RN  Related noncovered admission to [facility] diagnosis of Lung Ca w/mets.   Pt is a DNR  code.  Pt seen at bedside, now unresponsive, extremities mottled. Respiratory rate even, unlabored. Son Maylon Cos and husband Dalbert Mayotte at bedside. Dalbert Mayotte stated that he did not think pt would be leaving the hospital as planned.Emotional support offered. Pt has significantly declined over the weekend, family voiced understanding of current state. Pt appears to be actively dying. Writer talked with staff RN Verline Lema, who reports that pt did wake slightly with turns this am, no moaning or discomfort noted. Per chart review nebulized morphine was given for dyspnea earlier in the morning, and Duragesic patch was increased to 69mcg. Pt remains on the heparin infusion.  [place] notified of change in pt condition. [facility] will continue to follow through discharge.  Patient's home medication list, transfer summary and OOF DNR in place on shadow chart.   Please call [facility] @ [phone]-with any hospice needs.   Thank you. Tracey Harries, RN  [facility]  Hospice Liaison  [phone])

## 2013-08-11 NOTE — Progress Notes (Signed)
ANTICOAGULATION CONSULT NOTE - Follow Up Consult  Pharmacy Consult for Heparin Indication: Arterial emboli to toes with concern per Dr. Marin Olp for NBTE  No Known Allergies  Patient Measurements: Height: 5\' 2"  (157.5 cm) Weight: 136 lb 3.9 oz (61.8 kg) IBW/kg (Calculated) : 50.1 Heparin Dosing Weight:   Vital Signs: Temp: 98.6 F (37 C) (07/26 2107) Temp src: Oral (07/26 2107) BP: 122/78 mmHg (07/26 2107) Pulse Rate: 123 (07/26 2107)  Labs:  Recent Labs  08/08/13 0449  08/09/13 0411  08/10/13 0500 08/10/13 1816 08/11/13 0230  HGB 10.3*  --  10.2*  --  9.9*  --  8.8*  HCT 30.6*  --  31.2*  --  29.8*  --  25.8*  PLT 51*  --  46*  --  50*  --  45*  HEPARINUNFRC 0.31  < > 0.41  < > 0.27* 0.21* 0.28*  CREATININE 1.03  --   --   --   --   --   --   < > = values in this interval not displayed.  Estimated Creatinine Clearance: 50.9 ml/min (by C-G formula based on Cr of 1.03).   Medications:  Infusions:  . sodium chloride 30 mL/hr at 08/10/13 1020  . heparin      Assessment: Patient with low heparin level again.  PLT still low.  No issues per RN.  Goal of Therapy:  Heparin level 0.3-0.7 units/ml Monitor platelets by anticoagulation protocol: Yes   Plan:  Increase heparin to 1000 units/hr Recheck level at [place], [facility] [place] 08/11/2013,3:27 AM

## 2013-08-11 NOTE — Progress Notes (Signed)
ANTICOAGULATION CONSULT NOTE - Follow Up Consult  Pharmacy Consult for Heparin Indication: Arterial emboli to toes with concern per Dr. Marin Olp for NBTE  No Known Allergies  Patient Measurements: Height: 5\' 2"  (157.5 cm) Weight: 136 lb 3.9 oz (61.8 kg) IBW/kg (Calculated) : 50.1  Vital Signs: Temp: 98.8 F (37.1 C) (07/27 1355) Temp src: Oral (07/27 1355) BP: 110/72 mmHg (07/27 1355) Pulse Rate: 106 (07/27 1355)  Labs:  Recent Labs  08/09/13 0411  08/10/13 0500  08/11/13 0230 08/11/13 0940 08/11/13 1540  HGB 10.2*  --  9.9*  --  8.8*  --   --   HCT 31.2*  --  29.8*  --  25.8*  --   --   PLT 46*  --  50*  --  45*  --   --   HEPARINUNFRC 0.41  < > 0.27*  < > 0.28* 0.42 0.37  < > = values in this interval not displayed.  Estimated Creatinine Clearance: 50.9 ml/min (by C-G formula based on Cr of 1.03).   Medications:  Infusions:  . sodium chloride 20 mL/hr at 08/11/13 0749  . heparin 1,000 Units/hr (08/11/13 0358)    Assessment: 58 yo female with metastatic lung cancer admitted from office due to concern for arterial emboli to the toes. Pharmacy consulted to dose IV heparin.  Recent lovenox 100mg  (1.6 mg/kg) SQ at 4pm 7/20. IV heparin infusion started at 6AM 7/21. Heparin level therapeutic at 0.46 on 1000 units/hr. CBC: Hgb 8.8, Plt 45. No further bleeding issues noted.  Continues on ASA 325mg  as PTA.   Goal of Therapy:  Heparin level 0.3-0.7 units/ml Monitor platelets by anticoagulation protocol: Yes   Plan:   Continue heparin IV infusion at 1000 units/hr (conservative titration d/t thrombocytopenia)  Check confirmatory heparin level in 6 hours (at 1600)  Daily heparin level and CBC  Continue to monitor H&H and platelets  F/u anticoagulation plans   Sheliah Mends, PharmD Clinical Pharmacist-Resident Pager: [phone] 08/11/2013 5:18 PM  ADDENDUM:  Confirmation heparin level is therapeutic and no issues per RN.  Continue heparin at current rate and  start daily heparin levels.   F/u if full dose anticoagulation at this point for patient is really warranted.   Ralene Bathe, PharmD, BCPS 08/11/2013, 5:19 PM  Pager: [phone]

## 2013-08-11 NOTE — Progress Notes (Signed)
[facility] Chaplain Visit: °Met with pt's husband and adult son Jake in pt's room, with pt resting comfortably and husband indicating she is no longer responsive.  Husband thankful for visit, indicating that doctor's visit this AM made it clear that it is now "day to day" and pt no longer able to move home even for brief stay as previously hoped, or needing [facility].  "I'm just staying with her here."  Husband and son matter of fact and straight forward about what was ahead, and thankful for prayer, but not needing to process issues any further.  Husband agreed to have their pastor Bob Voorhees of [employer] updated. °John Connor, [facility] Chaplain ([phone]) °

## 2013-08-11 NOTE — Progress Notes (Signed)
ANTICOAGULATION CONSULT NOTE - Follow Up Consult  Pharmacy Consult for Heparin Indication: Arterial emboli to toes with concern per Dr. Marin Olp for NBTE  No Known Allergies  Patient Measurements: Height: 5\' 2"  (157.5 cm) Weight: 136 lb 3.9 oz (61.8 kg) IBW/kg (Calculated) : 50.1  Vital Signs: Temp: 97.6 F (36.4 C) (07/27 0432) Temp src: Oral (07/27 0432) BP: 118/84 mmHg (07/27 0432) Pulse Rate: 113 (07/27 0432)  Labs:  Recent Labs  08/09/13 0411  08/10/13 0500 08/10/13 1816 08/11/13 0230  HGB 10.2*  --  9.9*  --  8.8*  HCT 31.2*  --  29.8*  --  25.8*  PLT 46*  --  50*  --  45*  HEPARINUNFRC 0.41  < > 0.27* 0.21* 0.28*  < > = values in this interval not displayed.  Estimated Creatinine Clearance: 50.9 ml/min (by C-G formula based on Cr of 1.03).   Medications:  Infusions:  . sodium chloride 20 mL/hr at 08/11/13 0749  . heparin 1,000 Units/hr (08/11/13 0358)    Assessment: 58 yo female with metastatic lung cancer admitted from office due to concern for arterial emboli to the toes. Pharmacy consulted to dose IV heparin.  Recent lovenox 100mg  (1.6 mg/kg) SQ at 4pm 7/20. IV heparin infusion started at 6AM 7/21. Heparin level therapeutic at 0.46 on 1000 units/hr. CBC: Hgb 8.8, Plt 45. No further bleeding issues noted.  Continues on ASA 325mg  as PTA.   Goal of Therapy:  Heparin level 0.3-0.7 units/ml Monitor platelets by anticoagulation protocol: Yes   Plan:   Continue heparin IV infusion at 1000 units/hr (conservative titration d/t thrombocytopenia)  Check confirmatory heparin level in 6 hours (at 1600)  Daily heparin level and CBC  Continue to monitor H&H and platelets  F/u anticoagulation plans   Sheliah Mends, PharmD Clinical Pharmacist-Resident Pager: [phone] 08/11/2013 9:58 AM

## 2013-08-11 NOTE — Progress Notes (Signed)
Pt is more lethargic at this time unable to take meds po. Called md to get orders for maybe IV meds.

## 2013-08-11 NOTE — Progress Notes (Signed)
[facility] MSW note: This is a related, noncovered hospice admission. Pt did not respond to MSW. Son-Tyler, mother, and brother at the bedside. Spouse arrived during visit. Spouse and MSW discussed in private pt's status and decline. Spouse stated that he is aware of pt's poor prognosis. Spouse stated that he knows taking pt home is not an option any longer. Spouse stated that he is open to [facility] if pt is medically stable to transfer. There will be a BP bed open on Wednesday this week if pt is medically stable to transfer and Dr. Marin Olp approves. MSW informed Alison Murray, hospital SW and Flo Shanks, RN liason of spouse's desire. MSW offered support as pt is declining. Please call if questions.   Marilynne Halsted, MSW [phone]

## 2013-08-12 LAB — CBC
HCT: 31 % — ABNORMAL LOW (ref 36.0–46.0)
HEMOGLOBIN: 9.9 g/dL — AB (ref 12.0–15.0)
MCH: 31 pg (ref 26.0–34.0)
MCHC: 31.9 g/dL (ref 30.0–36.0)
MCV: 97.2 fL (ref 78.0–100.0)
PLATELETS: 74 10*3/uL — AB (ref 150–400)
RBC: 3.19 MIL/uL — ABNORMAL LOW (ref 3.87–5.11)
RDW: 17.4 % — ABNORMAL HIGH (ref 11.5–15.5)
WBC: 14.5 10*3/uL — ABNORMAL HIGH (ref 4.0–10.5)

## 2013-08-12 LAB — COMPREHENSIVE METABOLIC PANEL
ALBUMIN: 1.6 g/dL — AB (ref 3.5–5.2)
ALK PHOS: 223 U/L — AB (ref 39–117)
ALT: 10 U/L (ref 0–35)
ANION GAP: 20 — AB (ref 5–15)
AST: 16 U/L (ref 0–37)
BUN: 41 mg/dL — AB (ref 6–23)
CHLORIDE: 97 meq/L (ref 96–112)
CO2: 16 mEq/L — ABNORMAL LOW (ref 19–32)
CREATININE: 1.75 mg/dL — AB (ref 0.50–1.10)
Calcium: 8.6 mg/dL (ref 8.4–10.5)
GFR calc Af Amer: 36 mL/min — ABNORMAL LOW (ref >90)
GFR calc non Af Amer: 31 mL/min — ABNORMAL LOW (ref >90)
Glucose, Bld: 128 mg/dL — ABNORMAL HIGH (ref 70–99)
Potassium: 5.6 mEq/L — ABNORMAL HIGH (ref 3.7–5.3)
Sodium: 133 mEq/L — ABNORMAL LOW (ref 137–147)
Total Bilirubin: 0.4 mg/dL (ref 0.3–1.2)
Total Protein: 5.8 g/dL — ABNORMAL LOW (ref 6.0–8.3)

## 2013-08-12 LAB — HEPARIN LEVEL (UNFRACTIONATED): Heparin Unfractionated: 0.51 IU/mL (ref 0.30–0.70)

## 2013-08-12 LAB — LACTIC ACID, PLASMA: Lactic Acid, Venous: 2 mmol/L (ref 0.5–2.2)

## 2013-08-12 MED ORDER — SCOPOLAMINE 1 MG/3DAYS TD PT72
1.0000 | MEDICATED_PATCH | TRANSDERMAL | Status: DC
  Administered 2013-08-12: 1.5 mg via TRANSDERMAL
  Filled 2013-08-12: qty 1

## 2013-08-12 MED ORDER — SODIUM CHLORIDE 0.9 % IV SOLN
2.0000 mg/h | INTRAVENOUS | Status: DC
  Administered 2013-08-12: 2 mg/h via INTRAVENOUS
  Filled 2013-08-12: qty 5

## 2013-08-12 MED ORDER — LORAZEPAM 2 MG/ML IJ SOLN: 1.0000 mg | INTRAMUSCULAR | Status: DC | PRN

## 2013-08-16 NOTE — Progress Notes (Signed)
Called to room by patient's husband.  Patient found without respiration, without pulse or blood pressure.  Patient pronounced at 11:15 by 2 RN' including myself and Laural Benes, RN.  Dr. Marin Olp notified.  Other family members also present in waiting room and made aware of patient's expiration and support offered.  Zandra Abts [facility]  August 29, 2013  11:22 AM

## 2013-08-16 NOTE — Progress Notes (Signed)
ANTICOAGULATION CONSULT NOTE - Follow Up Consult  Pharmacy Consult for Heparin Indication: Arterial emboli to toes with concern per Dr. Marin Olp for NBTE   No Known Allergies  Patient Measurements: Height: 5\' 2"  (157.5 cm) Weight: 143 lb (64.864 kg) IBW/kg (Calculated) : 50.1 Heparin Dosing Weight:   Vital Signs: Temp: 97.8 F (36.6 C) (07/28 0515) Temp src: Axillary (07/28 0515) BP: 126/81 mmHg (07/28 0515) Pulse Rate: 130 (07/28 0515)  Labs:  Recent Labs  08/10/13 0500  08/11/13 0230 08/11/13 0940 08/11/13 1540 09/06/2013 0540  HGB 9.9*  --  8.8*  --   --  9.9*  HCT 29.8*  --  25.8*  --   --  31.0*  PLT 50*  --  45*  --   --  PENDING  HEPARINUNFRC 0.27*  < > 0.28* 0.42 0.37 0.51  CREATININE  --   --   --   --   --  1.75*  < > = values in this interval not displayed.  Estimated Creatinine Clearance: 30.6 ml/min (by C-G formula based on Cr of 1.75).   Medications:  Infusions:  . sodium chloride 20 mL/hr at 08/11/13 0749  . heparin 1,000 Units/hr (08/11/13 2150)    Assessment: Patient with heparin level at goal.    Goal of Therapy:  Heparin level 0.3-0.7 units/ml Monitor platelets by anticoagulation protocol: Yes   Plan:  Continue heparin drip at current rate Recheck level with 7/29 AM labs  Tyler Deis, [facility] [place] Sep 06, 2013,6:25 AM

## 2013-08-16 NOTE — Progress Notes (Signed)
Mrs. Leite decline is progressing more quickly now. She's not very responsive. She is having more tachypnea.  I suspect that she probably is becoming acidotic and maybe even septic from the cyanosis and impending gangrene of the right foot.  I think that her prognosis now probably will be no more than one day.  I will go ahead and stop the heparin infusion. I will put her on a Dilaudid infusion. I want to make sure she is comfortable without any respiratory difficulties. She has some rattling. I suspect this is just secretions within her airway. I will go ahead and put her on a scopolamine patch.  We will stop her oral medications. I'll take off her Duragesic patch.  Her renal function is declining. Her potassium is going up. Surprisingly, her platelet count is 74,000. Hemoglobin is 9.9. Her white cell count is 14.5.  She does not appear to be in any obvious discomfort. Again she has the tachypnea from being acidotic. Her lactic acid is 2.0. This is on the upper end of normal.  With her physical exam, she is afebrile. Her blood pressure Is 126/81. Pulse is 130. Her lungs show some crackles bilaterally. She has no wheezes. She has D. cerebral and. Cardiac exam is tachycardic. Her heart sounds are are more distant. Abdomen is soft. Bowel sounds are decreased. Extremities shows the cyanosis of the toes on the right foot.  Again, I suspect that her passing onto glory will be either today or tomorrow. I spoke to her husband he is with her. Her family is in town. Again I think that she will pass on within a day or so.  She has been very tough throughout her entire journey with cancer. She has really had a decent quality of life for the most part. Only recently when she began to have rapid progression of her disease as her quality of life been compromised.  I very much appreciate all the great care that she is receiving on [address]. Her family is very appreciative of the compassion that has been shown  to her. [place] 91:1-2

## 2013-08-16 NOTE — Progress Notes (Signed)
Dilaudid drip discontinued.  Wasted 76 cc's of the drip.  Witnessed by Reyne Dumas, RN.  Zandra Abts [facility]  08-15-2013  11:23 AM

## 2013-08-16 NOTE — Progress Notes (Addendum)
Inpatient RN visit- [place] [facility] RN Visit-Karen Alford [facility] RN  Related noncovered admission to [facility] diagnosis of Lung Ca.  Pt is DNR  code.   Pt seen at bedside, husband Dalbert Mayotte present. Pt is now actively dying with mouth breathing/movement only. She appears comfortable, Dalbert Mayotte is aware the she is dying and is very close. He stated that his sons and mother in law were in the waiting room and had said good bye. Writer offered presence, [place] declined. Emotional support offered. [facility] Team made aware of changes.  Patient's home medication list and transfer summary in place on shadow chart.   Please call [facility] @ [phone]--call RN with any hospice needs or at time of death.   Thank you. Tracey Harries, RN  [facility]  Hospice Liaison  [phone])

## 2013-08-16 NOTE — Progress Notes (Signed)
Patient's family has left the hospital.  Patient's body prepared to be taken down to morgue.  Patient still has single gold wedding band to left ring finger.  Tape applied around ring and ring still on patient at time she was placed in the morgue.  [place]  2013/08/28  12:30 PM

## 2013-08-16 DEATH — deceased

## 2013-08-22 ENCOUNTER — Ambulatory Visit: Payer: BC Managed Care – PPO

## 2013-08-22 ENCOUNTER — Other Ambulatory Visit: Payer: BC Managed Care – PPO | Admitting: Lab

## 2013-08-22 ENCOUNTER — Ambulatory Visit: Payer: BC Managed Care – PPO | Admitting: Hematology & Oncology

## 2013-08-27 NOTE — Discharge Summary (Signed)
[name], [name] NO.:  [id number]  MEDICAL RECORD NO.:  [id number]  LOCATION:  2409                         FACILITY:  [facility]  PHYSICIAN:  Volanda Napoleon, M.D.  DATE OF BIRTH:  21-Nov-1955  DATE OF ADMISSION:  07/25/2013 DATE OF DISCHARGE:  19-Aug-2013                              DISCHARGE SUMMARY   DIAGNOSES ON DEATH: 1. Metastatic adenocarcinoma of the lung. 2. 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 3. Cyanosis and likely gangrene of the feet. 4. Severe protein calorie malnutrition. 5. Anemia secondary to marrow infiltration by malignancy. 6. Renal insufficiency - progressive secondary to hypotension. 7. 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  HOSPITAL COURSE:  [name] from the office on August 05, 2013.  She was Moreno because of gangrene/cyanosis in her feet.  She has metastatic adenocarcinoma of the lung.  She was on hospice.  She was basically in comfort care.  The cyanosis progressed over about 3 or 4 days.  When we saw her in the office, it is obvious that she was having arterial emboli.  She had basically cold toes and the bottom of her feet were cyanotic and cold.  She was having more pain in her feet.  She really cannot move her toes.  We Moreno her, we thought she had hemoglobin of 8.  Her platelet count was 45,000.  This was quite different than what it was previously.  As such, I thought that she probably had a microangiopathic hemolytic anemia.  This would explain her anemia and the thrombocytopenia.  Her hemoglobin dropped to 6.4.  We went ahead and transfused her.  Despite the thrombocytopenia, she was clearly hypercoagulable.  I went ahead and started her on heparin.  She was on a fentanyl patch.  We kept adjusting the fentanyl patch dose.  She really was not able to get out of bed.  She was not eating all that much.  She was not having any  increased cough or shortness of breath.  We basically continued her on the heparin drip.  She did have Dopplers of her legs done.  These showed no circulation to her toes.  The heparin drip helped her left foot.  She had improvement in the color and warmth of the toes and the bottom of her left foot.  Her right foot really was unchanged.  She never improved with the right foot.  In fact, she probably had a worsening of the cyanosis and was developing gangrene.  We did try some Neurontin.  This may have helped for a little bit. We kept adjusting her fentanyl.  We gave her breakthrough medication with Dilaudid.  Again, she was not able to get out of bed.  She did not want a Foley catheter in initially, but we find she did have to put one in as she became less responsive.  Pharmacy was very good about managing her anticoagulation.  Her husband was well aware of the fact that she was not going to be able to go home.  He was just appreciative of the great care that she got on the Floor on [address].  She  began to decline at the weekend of the August 09, 2013.  She really began to go downhill.  She was much less responsive.  She really was not eating.  She became more tachycardic.  She was becoming more hypotensive.  Her family Moreno her from [place].  This, I think, what she was waiting for.  She was becoming more acidotic from the tissue necrosis in her right foot.  I did not feel that any further intervention was needed for this.  We did start her on morphine infusion.  This was I just started on the morning of the 22-Aug-2013.  I told her husband that I did not think that she was going to make it more than 1 or 2 days.  Her family has a strong faith.  They, again, were thankful that she was getting very good attention from the staff on [address].  We did stop the heparin drip in the morning of August 22, 2013.  I did not see we were giving any further benefit from the heparin  infusion.  Her lab work on the day of passing showed her white cell count to be 14.5, hemoglobin 10, hematocrit 31, platelet count 74,000.  Her BUN and creatinine of 41 and 1.75.  This was going up.  Her potassium was going up at 5.6.  Again, I think this was all reflective of tissue necrosis.  She really was not that responsive in the morning of 22-Aug-2013. Again, she was comfortable.  She was pronounced at 11:15 a.m. on 22-Aug-2013.  With deep regrets,     Volanda Napoleon, M.D.     PRE/MEDQ  D:  08/22/2013  T:  08/23/2013  Job:  [id number]

## 2013-08-27 NOTE — Discharge Summary (Deleted)
[name], [name] NO.:  [id number]  MEDICAL RECORD NO.:  [id number]  LOCATION:  6160                         FACILITY:  [facility]  PHYSICIAN:  Volanda Napoleon, M.D.  DATE OF BIRTH:  1955/03/25  DATE OF ADMISSION:  07/27/2013 DATE OF DISCHARGE:  09/09/2013                              DISCHARGE SUMMARY   DIAGNOSES ON DEATH: 1. Metastatic adenocarcinoma of the lung. 2. 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 3. Cyanosis and likely gangrene of the feet. 4. Severe protein calorie malnutrition. 5. Anemia secondary to marrow infiltration by malignancy. 6. Renal insufficiency - progressive secondary to hypotension. 7. 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  HOSPITAL COURSE:  [name] was admitted from the office on August 05, 2013.  She was admitted because of gangrene/cyanosis in her feet.  She has metastatic adenocarcinoma of the lung.  She was on hospice.  She was basically in comfort care.  The cyanosis progressed over about 3 or 4 days.  When we saw her in the office, it is obvious that she was having arterial emboli.  She had basically cold toes and the bottom of her feet were cyanotic and cold.  She was having more pain in her feet.  She really cannot move her toes.  We admitted her, we thought she had hemoglobin of 8.  Her platelet count was 45,000.  This was quite different than what it was previously.  As such, I thought that she probably had a microangiopathic hemolytic anemia.  This would explain her anemia and the thrombocytopenia.  Her hemoglobin dropped to 6.4.  We went ahead and transfused her.  Despite the thrombocytopenia, she was clearly hypercoagulable.  I went ahead and started her on heparin.  She was on a fentanyl patch.  We kept adjusting the fentanyl patch dose.  She really was not able to get out of bed.  She was not eating all that much.  She was not having any  increased cough or shortness of breath.  We basically continued her on the heparin drip.  She did have Dopplers of her legs done.  These showed no circulation to her toes.  The heparin drip helped her left foot.  She had improvement in the color and warmth of the toes and the bottom of her left foot.  Her right foot really was unchanged.  She never improved with the right foot.  In fact, she probably had a worsening of the cyanosis and was developing gangrene.  We did try some Neurontin.  This may have helped for a little bit. We kept adjusting her fentanyl.  We gave her breakthrough medication with Dilaudid.  Again, she was not able to get out of bed.  She did not want a Foley catheter in initially, but we find she did have to put one in as she became less responsive.  Pharmacy was very good about managing her anticoagulation.  Her husband was well aware of the fact that she was not going to be able to go home.  He was just appreciative of the great care that she got on the Floor on [address].  She  began to decline at the weekend of the August 09, 2013.  She really began to go downhill.  She was much less responsive.  She really was not eating.  She became more tachycardic.  She was becoming more hypotensive.  Her family admitted her from [place].  This, I think, what she was waiting for.  She was becoming more acidotic from the tissue necrosis in her right foot.  I did not feel that any further intervention was needed for this.  We did start her on morphine infusion.  This was I just started on the morning of the Sep 03, 2013.  I told her husband that I did not think that she was going to make it more than 1 or 2 days.  Her family has a strong faith.  They, again, were thankful that she was getting very good attention from the staff on [address].  We did stop the heparin drip in the morning of 2013-09-03.  I did not see we were giving any further benefit from the heparin  infusion.  Her lab work on the day of passing showed her white cell count to be 14.5, hemoglobin 10, hematocrit 31, platelet count 74,000.  Her BUN and creatinine of 41 and 1.75.  This was going up.  Her potassium was going up at 5.6.  Again, I think this was all reflective of tissue necrosis.  She really was not that responsive in the morning of 09/03/2013. Again, she was comfortable.  She was pronounced at 11:15 a.m. on 03-Sep-2013.  With deep regrets,     Volanda Napoleon, M.D.     PRE/MEDQ  D:  08/22/2013  T:  08/23/2013  Job:  [id number]

## 2013-09-05 ENCOUNTER — Ambulatory Visit: Payer: BC Managed Care – PPO | Admitting: Hematology & Oncology

## 2013-09-05 ENCOUNTER — Ambulatory Visit: Payer: BC Managed Care – PPO

## 2013-09-05 ENCOUNTER — Other Ambulatory Visit: Payer: BC Managed Care – PPO | Admitting: Lab

## 2013-09-09 ENCOUNTER — Other Ambulatory Visit: Payer: Self-pay | Admitting: *Deleted

## 2013-09-18 ENCOUNTER — Encounter (HOSPITAL_COMMUNITY): Payer: Self-pay

## 2013-10-24 NOTE — Telephone Encounter (Signed)
error 

## 2013-10-29 ENCOUNTER — Telehealth: Payer: Self-pay | Admitting: Hematology & Oncology

## 2013-10-29 NOTE — Telephone Encounter (Signed)
[employer] assistance will expire the one year term on 05/23/14. However, pt is now deceased.  Card destroyed today 10/29/2013.       Valid: 22-May-2013-23-May-2014

## 2013-11-17 ENCOUNTER — Encounter (HOSPITAL_COMMUNITY): Payer: Self-pay | Admitting: *Deleted

## 2014-01-22 ENCOUNTER — Ambulatory Visit: Payer: BC Managed Care – PPO | Admitting: Nurse Practitioner

## 2014-08-11 IMAGING — XA IR ABSCESS DRAINAGE
1 series · 1 of 1 positions shown · non-contrast
Comparison: none

CLINICAL DATA: Metastatic lung carcinoma. Recurrent moderate
bilateral pleural effusions.

EXAM:
TUNNELED PLEURAL CUFFED DRAIN CATHETER PLACEMENT UNDER ULTRASOUND
AND FLUOROSCOPIC GUIDANCE
FLUOROSCOPY TIME:  6 seconds
TECHNIQUE: The procedure, risks (including but not limited to bleeding,
infection, organ damage ), benefits, and alternatives were explained
to the patient. Questions regarding the procedure were encouraged
and answered. The patient understands and consents to the procedure.
Survey ultrasound of the right pleural space was performed and an
appropriate skin entry site was selected. As antibiotic prophylaxis,
cefazolin 2 g was ordered pre-procedure and administered
intravenously within one hour of incision. Skin site was marked,
prepped with Betadine, and draped in usual sterile fashion, and
infiltrated locally with 1% lidocaine. Under real-time ultrasound
guidance, a 18 gauge needle was advanced into the right pleural
space. Ultrasound imaging documentation was saved. Pleural fluid
could be aspirated. An Amplatz guidewire advanced through the sheath
easily. The PleurX cuffed drain catheter was tunneled from an
appropriate skin entry site to the dermatotomy. The sheath was
exchanged over the wire for a peel-away sheath, through which the
PleurX catheter was advanced into the right pleural space.
Fluoroscopy confirmed appropriate position of the catheter. The
catheter was secured externally with 0 Prolene suture. The
dermatotomy was closed with Dermabond. Total of 1 liters clear
yellow pleural fluid were removed, a sample sent for the requested
laboratory studies. The patient tolerated the procedure well, with
no immediate complication.

[Series 300: ir radiologist eval & mgmt · 1 of 1 slices shown]
[im 1/1]
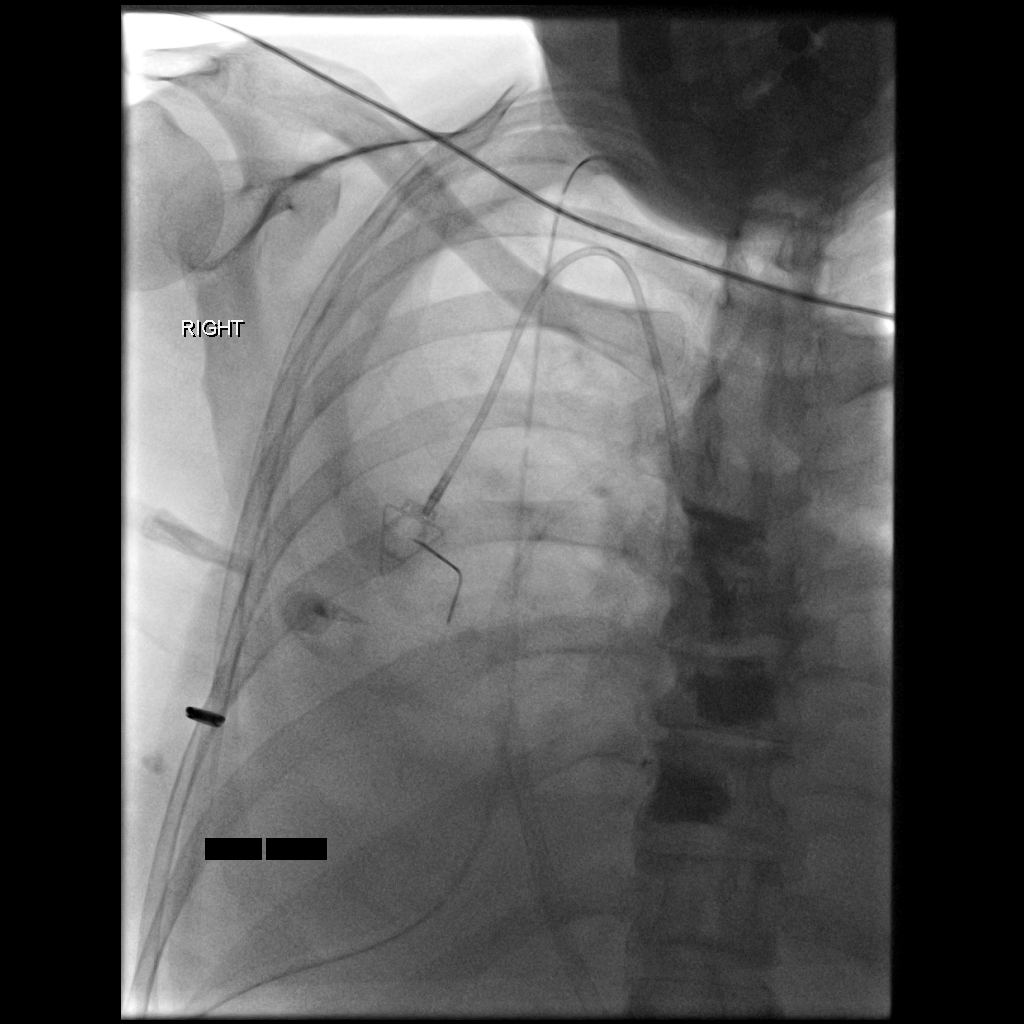

[1 of 1 positions shown; findings below may reference images not displayed]

IMPRESSION: 1. Technically successful tunneled cuffed right pleural PleurX drain
catheter placement.
2. Right thoracentesis removing 1 L .

## 2014-08-12 IMAGING — US IR ABSCESS DRAINAGE
1 series · 1 of 1 positions shown · non-contrast
Comparison: none

CLINICAL DATA: 58-year-old with lung cancer and recurrent pleural
effusions. A right pleural catheter was placed yesterday. Plan for
placement of a left pleural catheter today.

[Series 1: ir abscess drainage · 1 of 1 slices shown]
[im 1/1]
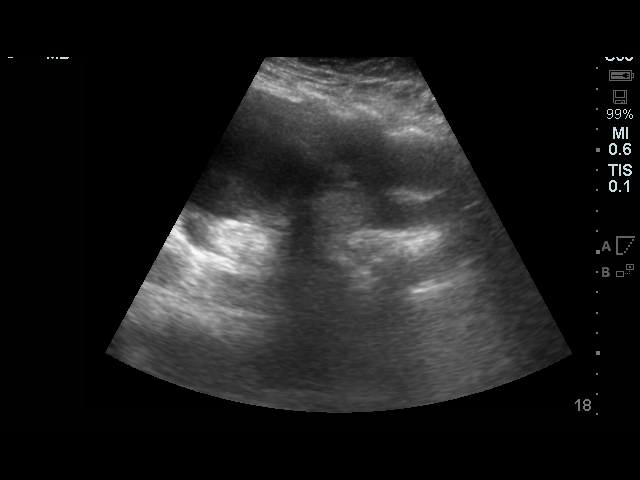

[1 of 1 positions shown; findings below may reference images not displayed]

EXAM:
PLACEMENT OF TUNNELED LEFT PLEURAL CATHETER WITH ULTRASOUND AND
FLUOROSCOPIC GUIDANCE

FLUOROSCOPY TIME:  42 seconds

MEDICATIONS:
2 g Ancef. As antibiotic prophylaxis, Ancef was ordered
pre-procedure and administered intravenously within one hour of
incision. 2 mg Versed, 100 mcg fentanyl. A radiology nurse monitored
the patient for moderate sedation.

ANESTHESIA/SEDATION:
Moderate sedation time: 30 min

PROCEDURE:
The procedure was explained to the patient. The risks and benefits
of the procedure were discussed and the patient's questions were
addressed. Informed consent was obtained from the patient. The
patient was placed supine on the interventional table. The patient
was mildly elevated to help with her breathing. The left side of the
chest was evaluated with ultrasound. The left mid axillary region
was prepped and draped in sterile fashion. Maximal barrier sterile
technique was utilized including caps, mask, sterile gowns, sterile
gloves, sterile drape, hand hygiene and skin antiseptic. The skin
was anesthetized with 1% lidocaine. Using ultrasound guidance, a
Yueh catheter was directed into the left pleural fluid. Clear yellow
fluid was aspirated. Skin was anesthetized below the pleural
puncture site and a small incision was made. A PleurX catheter was
placed through this incision and a subcutaneous tunnel was created
to the pleural access site. The Yueh catheter was removed over a
stiff Amplatz wire. The pleural catheter was intentionally cut and
slightly shortened. The tract was dilated and a peel-away sheath was
placed. The catheter was placed through the peel-away sheath and
directed into the pleural space. Approximately 700 mL of pleural
fluid was removed. The pleural access site was closed using
absorbable suture and Dermabond. The catheter was secured to the
skin with Prolene suture. Fluoroscopic and ultrasound images were
taken and saved for documentation.
FINDINGS: Left pleural effusion. Catheter tip in the medial left upper chest.
700 mL of yellow fluid was removed. A small amount of blood tinged
pleural fluid was noted at the end of the procedure.

COMPLICATIONS:
None
IMPRESSION: Successful placement of a left chest tunneled pleural catheter.

## 2014-08-12 IMAGING — CR DG CHEST 1V
1 series · 1 of 1 positions shown · non-contrast
Comparison: Two-view chest 07/16/2013

CLINICAL DATA: Placement of chest drains, lung cancer

EXAM:
CHEST - 1 VIEW

[view not recorded]
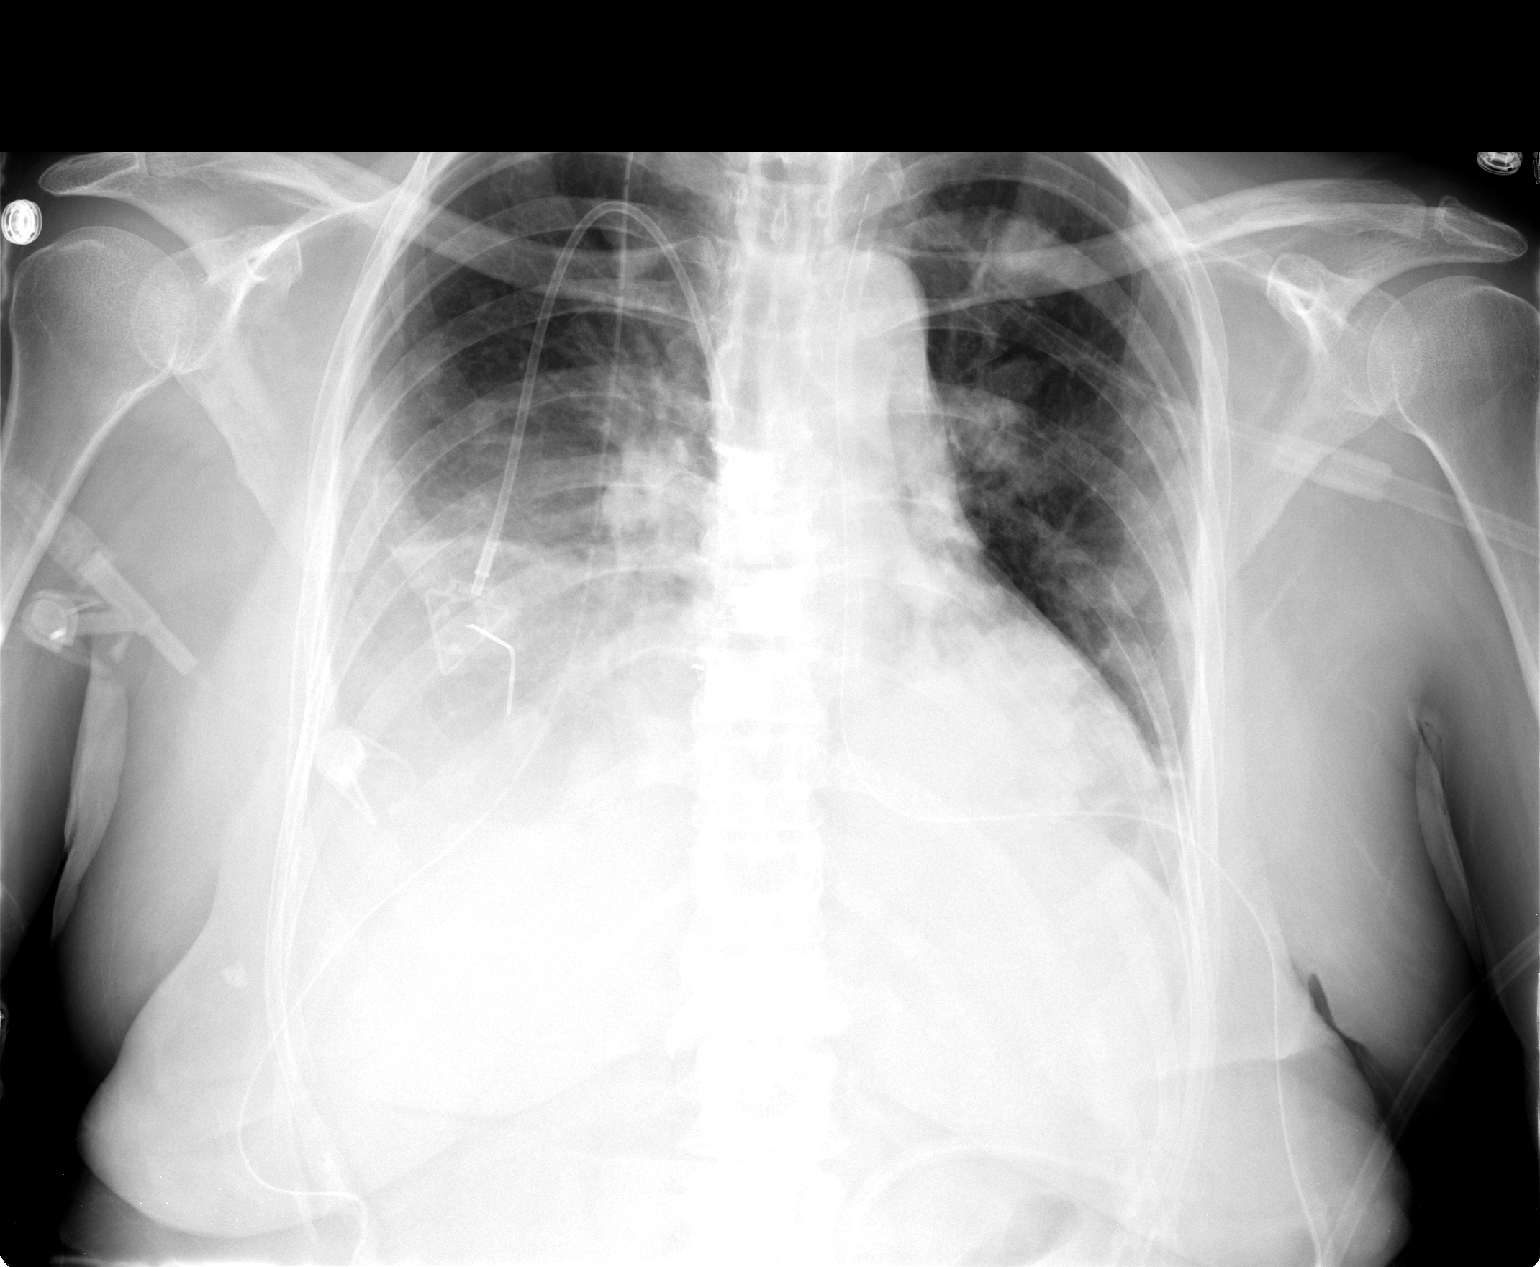

[1 of 1 positions shown; findings below may reference images not displayed]

FINDINGS: Bilateral chest tubes with tips in the medial aspect of the right
and left lung apices. No appreciable pneumothorax. Right-sided porta
catheter tip superior vena cava. Near complete resolution of the
left pleural effusion. Small residual right pleural effusion.
Diffuse increased density within the right lung base. Multiple
pulmonary nodules appreciated within the left hemi thorax and to a
lesser extent the right. Cement augmentation of multiple thoracic
and lumbar vertebral bodies. No acute osseous abnormalities.
IMPRESSION: Near complete resolution of the left pleural effusion. Small
residual right pleural effusion. Bibasilar atelectasis versus
infiltrates. Persistent bilateral pulmonary nodules consistent with
metastatic disease. No pneumothorax.
